# Patient Record
Sex: Female | Born: 1935 | Race: White | Hispanic: No | Smoking: Never smoker
Health system: Southern US, Community
[De-identification: ages and names within clinical notes are randomized; demographics above are authoritative.]

## PROBLEM LIST (undated history)

## (undated) DIAGNOSIS — F329 Major depressive disorder, single episode, unspecified: Secondary | ICD-10-CM

## (undated) DIAGNOSIS — C50919 Malignant neoplasm of unspecified site of unspecified female breast: Secondary | ICD-10-CM

## (undated) DIAGNOSIS — R42 Dizziness and giddiness: Secondary | ICD-10-CM

## (undated) DIAGNOSIS — I10 Essential (primary) hypertension: Secondary | ICD-10-CM

## (undated) DIAGNOSIS — M1990 Unspecified osteoarthritis, unspecified site: Secondary | ICD-10-CM

## (undated) DIAGNOSIS — M810 Age-related osteoporosis without current pathological fracture: Secondary | ICD-10-CM

## (undated) DIAGNOSIS — C4491 Basal cell carcinoma of skin, unspecified: Secondary | ICD-10-CM

## (undated) DIAGNOSIS — F32A Depression, unspecified: Secondary | ICD-10-CM

## (undated) HISTORY — PX: DILATION AND CURETTAGE OF UTERUS: SHX78

## (undated) HISTORY — PX: OTHER SURGICAL HISTORY: SHX169

## (undated) HISTORY — PX: COLONOSCOPY: SHX174

## (undated) HISTORY — PX: BREAST RECONSTRUCTION: SHX9

## (undated) HISTORY — PX: CHOLECYSTECTOMY: SHX55

## (undated) HISTORY — PX: MASTECTOMY: SHX3

## (undated) HISTORY — PX: ABDOMINAL HYSTERECTOMY: SHX81

## (undated) HISTORY — PX: TONSILLECTOMY: SUR1361

---

## 1997-06-30 ENCOUNTER — Emergency Department (HOSPITAL_COMMUNITY): Admission: EM | Admit: 1997-06-30 | Discharge: 1997-06-30 | Payer: Self-pay | Admitting: Emergency Medicine

## 1997-07-25 ENCOUNTER — Encounter: Admission: RE | Admit: 1997-07-25 | Discharge: 1997-10-23 | Payer: Self-pay | Admitting: *Deleted

## 1997-12-19 ENCOUNTER — Encounter: Admission: RE | Admit: 1997-12-19 | Discharge: 1998-03-19 | Payer: Self-pay | Admitting: Orthopaedic Surgery

## 1998-04-10 ENCOUNTER — Ambulatory Visit (HOSPITAL_COMMUNITY): Admission: RE | Admit: 1998-04-10 | Discharge: 1998-04-10 | Payer: Self-pay | Admitting: Specialist

## 1999-01-10 ENCOUNTER — Encounter (INDEPENDENT_AMBULATORY_CARE_PROVIDER_SITE_OTHER): Payer: Self-pay | Admitting: *Deleted

## 1999-01-10 ENCOUNTER — Ambulatory Visit (HOSPITAL_COMMUNITY): Admission: RE | Admit: 1999-01-10 | Discharge: 1999-01-10 | Payer: Self-pay | Admitting: Gastroenterology

## 2002-07-07 ENCOUNTER — Ambulatory Visit (HOSPITAL_COMMUNITY): Admission: RE | Admit: 2002-07-07 | Discharge: 2002-07-07 | Payer: Self-pay | Admitting: Gastroenterology

## 2002-07-07 ENCOUNTER — Encounter (INDEPENDENT_AMBULATORY_CARE_PROVIDER_SITE_OTHER): Payer: Self-pay | Admitting: Specialist

## 2007-01-10 ENCOUNTER — Emergency Department (HOSPITAL_COMMUNITY): Admission: EM | Admit: 2007-01-10 | Discharge: 2007-01-10 | Payer: Self-pay | Admitting: Emergency Medicine

## 2007-01-20 ENCOUNTER — Encounter: Admission: RE | Admit: 2007-01-20 | Discharge: 2007-01-20 | Payer: Self-pay | Admitting: Internal Medicine

## 2007-08-11 ENCOUNTER — Emergency Department (HOSPITAL_COMMUNITY): Admission: EM | Admit: 2007-08-11 | Discharge: 2007-08-12 | Payer: Self-pay | Admitting: Emergency Medicine

## 2014-09-18 ENCOUNTER — Encounter (HOSPITAL_COMMUNITY): Payer: Self-pay | Admitting: Emergency Medicine

## 2014-09-18 ENCOUNTER — Emergency Department (HOSPITAL_COMMUNITY)
Admission: EM | Admit: 2014-09-18 | Discharge: 2014-09-18 | Disposition: A | Discharge status: Home / Self Care | Payer: Medicare Other | Attending: Emergency Medicine | Admitting: Emergency Medicine

## 2014-09-18 ENCOUNTER — Emergency Department (HOSPITAL_COMMUNITY): Payer: Medicare Other

## 2014-09-18 DIAGNOSIS — Y999 Unspecified external cause status: Secondary | ICD-10-CM | POA: Diagnosis not present

## 2014-09-18 DIAGNOSIS — W01198A Fall on same level from slipping, tripping and stumbling with subsequent striking against other object, initial encounter: Secondary | ICD-10-CM | POA: Insufficient documentation

## 2014-09-18 DIAGNOSIS — Z880 Allergy status to penicillin: Secondary | ICD-10-CM | POA: Insufficient documentation

## 2014-09-18 DIAGNOSIS — Z85828 Personal history of other malignant neoplasm of skin: Secondary | ICD-10-CM | POA: Diagnosis not present

## 2014-09-18 DIAGNOSIS — S0990XA Unspecified injury of head, initial encounter: Secondary | ICD-10-CM

## 2014-09-18 DIAGNOSIS — I10 Essential (primary) hypertension: Secondary | ICD-10-CM | POA: Diagnosis not present

## 2014-09-18 DIAGNOSIS — M810 Age-related osteoporosis without current pathological fracture: Secondary | ICD-10-CM | POA: Diagnosis not present

## 2014-09-18 DIAGNOSIS — Z79899 Other long term (current) drug therapy: Secondary | ICD-10-CM | POA: Diagnosis not present

## 2014-09-18 DIAGNOSIS — Z853 Personal history of malignant neoplasm of breast: Secondary | ICD-10-CM | POA: Insufficient documentation

## 2014-09-18 DIAGNOSIS — Y939 Activity, unspecified: Secondary | ICD-10-CM | POA: Diagnosis not present

## 2014-09-18 DIAGNOSIS — F329 Major depressive disorder, single episode, unspecified: Secondary | ICD-10-CM | POA: Diagnosis not present

## 2014-09-18 DIAGNOSIS — Y92003 Bedroom of unspecified non-institutional (private) residence as the place of occurrence of the external cause: Secondary | ICD-10-CM | POA: Diagnosis not present

## 2014-09-18 DIAGNOSIS — S0101XA Laceration without foreign body of scalp, initial encounter: Secondary | ICD-10-CM | POA: Diagnosis not present

## 2014-09-18 DIAGNOSIS — W19XXXA Unspecified fall, initial encounter: Secondary | ICD-10-CM

## 2014-09-18 DIAGNOSIS — Y92009 Unspecified place in unspecified non-institutional (private) residence as the place of occurrence of the external cause: Secondary | ICD-10-CM

## 2014-09-18 HISTORY — DX: Basal cell carcinoma of skin, unspecified: C44.91

## 2014-09-18 HISTORY — DX: Major depressive disorder, single episode, unspecified: F32.9

## 2014-09-18 HISTORY — DX: Dizziness and giddiness: R42

## 2014-09-18 HISTORY — DX: Age-related osteoporosis without current pathological fracture: M81.0

## 2014-09-18 HISTORY — DX: Unspecified osteoarthritis, unspecified site: M19.90

## 2014-09-18 HISTORY — DX: Depression, unspecified: F32.A

## 2014-09-18 HISTORY — DX: Essential (primary) hypertension: I10

## 2014-09-18 HISTORY — DX: Malignant neoplasm of unspecified site of unspecified female breast: C50.919

## 2014-09-18 NOTE — ED Provider Notes (Signed)
CSN: [id number]     Arrival date & time 09/18/14  0341 History   First MD Initiated Contact with Patient 09/18/14 0518     Chief Complaint  Patient presents with  . Head Injury     (Consider location/radiation/quality/duration/timing/severity/associated sxs/prior Treatment) HPI  This is an 81 year old female who had a mechanical fall at home this morning about 2 AM. There was no loss of consciousness but she did strike her head against a chest of drawers. She struck both the back of her head and also the right side of her head. She has a small laceration to her right temple. There was significant bleeding but this has been controlled with pressure. There is minimal associated pain. She denies neck pain or other injury. She has not been vomiting. She is not on anticoagulation.  Past Medical History  Diagnosis Date  . Hypertension   . Depression   . Breast cancer   . Basal cell carcinoma   . Osteoporosis   . Vertigo   . Arthritis    Past Surgical History  Procedure Laterality Date  . Cholecystectomy    . Tonsillectomy    . Mastectomy    . Dilation and curettage of uterus    . Abdominal hysterectomy    . Cataract surgery    . Capsulotomy with vaginal laser    . Colonoscopy    . Breast reconstruction     No family history on file. History  Substance Use Topics  . Smoking status: Never Smoker   . Smokeless tobacco: Not on file  . Alcohol Use: No   OB History    No data available     Review of Systems  All other systems reviewed and are negative.   Allergies  Azithromycin; Dalmane; Demerol; Felodipine; Fosamax; Hctz; Ivp dye; Keflin; Macrodantin; Morphine and related; Phenergan; Valium; Doxycycline; Keflex; Neosporin; and Penicillins  Home Medications   Prior to Admission medications   Medication Sig Start Date End Date Taking? Authorizing Provider  Calcium Carbonate-Vitamin D (CALCIUM 600+D HIGH POTENCY) 600-400 MG-UNIT per tablet Take 1 tablet by mouth 2 (two)  times daily.   Yes Historical Provider, MD  carvedilol (COREG) 25 MG tablet Take 25 mg by mouth 2 (two) times daily with a meal.   Yes Historical Provider, MD  cholecalciferol (VITAMIN D) 1000 UNITS tablet Take 1,000 Units by mouth daily.   Yes Historical Provider, MD  citalopram (CELEXA) 20 MG tablet Take 20 mg by mouth at bedtime.   Yes Historical Provider, MD  furosemide (LASIX) 20 MG tablet Take 20 mg by mouth daily as needed (for stasis detrmatitis).   Yes Historical Provider, MD  Lactobacillus Rhamnosus, GG, (CULTURELLE PO) Take 1 capsule by mouth daily.   Yes Historical Provider, MD  lisinopril (PRINIVIL,ZESTRIL) 40 MG tablet Take 40 mg by mouth daily.   Yes Historical Provider, MD  loratadine (CLARITIN) 10 MG tablet Take 10 mg by mouth daily as needed for allergies.   Yes Historical Provider, MD   BP 155/61 mmHg  Pulse 80  Temp(Src) 98 F (36.7 C) (Oral)  Resp 18  SpO2 97%   Physical Exam  General: Well-developed, well-nourished female in no acute distress; appearance consistent with age of record HENT: normocephalic; atraumatic Eyes: pupils equal, round and reactive to light; extraocular muscles intact; lens implants Neck: supple Heart: regular rate and rhythm Lungs: clear to auscultation bilaterally Abdomen: soft; nondistended; nontender; bowel sounds present Extremities: Arthritic changes; nontender; trace edema of lower legs Neurologic: Awake, alert  and oriented; motor function intact in all extremities and symmetric; no facial droop Skin: Warm and dry Psychiatric: Normal mood and affect    ED Course  Procedures (including critical care time)   MDM  Nursing notes and vitals signs, including pulse oximetry, reviewed.  Summary of this visit's results, reviewed by myself:  Imaging Studies: Ct Head Wo Contrast  09/18/2014   CLINICAL DATA:  Patient fell backwards in the bathroom and struck back of head on a chest of doors. Struck left-sided head on unknown object.  Discomfort to the back of the head. Puncture wound to the left side of the head.  EXAM: CT HEAD WITHOUT CONTRAST  TECHNIQUE: Contiguous axial images were obtained from the base of the skull through the vertex without intravenous contrast.  COMPARISON:  MRI brain 01/20/2007.  CT head 01/10/2007.  FINDINGS: Ventricles and sulci appear symmetrical with mild atrophy. Extra-axial calcified lesion in the left posterior frontal region measuring 15 mm diameter, likely representing meningioma. Additional 5 mm rounded calcification in the right frontal convexity also likely represents a small meningioma. Dense calcification along the falx. No mass effect or midline shift. No abnormal extra-axial fluid collections. Gray-white matter junctions are distinct. Basal cisterns are not effaced. No evidence of acute intracranial hemorrhage. No depressed skull fractures. Visualized paranasal sinuses and mastoid air cells are not opacified.  IMPRESSION: No acute intracranial abnormalities. Extra-axial calcified lesions likely representing meningiomas, unchanged since previous study.   Electronically Signed   By: Lucienne Capers M.D.   On: 09/18/2014 06:03      Shanon Rosser, MD 09/18/14 [phone]

## 2014-09-18 NOTE — ED Notes (Signed)
Pt states she fell backward in her bedroom and hit her the back of her head on the chest of drawers and hit the left side of her head on unknown object  Pt is c/o discomfort to the back of her head  Pt has a puncture wound noted to the left side of her head  Bleeding controlled  Pt denies LOC  Pt denies taking any blood thinners

## 2015-12-04 ENCOUNTER — Emergency Department (HOSPITAL_COMMUNITY)
Admission: EM | Admit: 2015-12-04 | Discharge: 2015-12-04 | Disposition: A | Discharge status: Home / Self Care | Payer: Medicare Other | Attending: Emergency Medicine | Admitting: Emergency Medicine

## 2015-12-04 ENCOUNTER — Emergency Department (HOSPITAL_COMMUNITY): Payer: Medicare Other

## 2015-12-04 ENCOUNTER — Encounter (HOSPITAL_COMMUNITY): Payer: Self-pay | Admitting: *Deleted

## 2015-12-04 DIAGNOSIS — W109XXA Fall (on) (from) unspecified stairs and steps, initial encounter: Secondary | ICD-10-CM | POA: Insufficient documentation

## 2015-12-04 DIAGNOSIS — Z79899 Other long term (current) drug therapy: Secondary | ICD-10-CM | POA: Diagnosis not present

## 2015-12-04 DIAGNOSIS — Z853 Personal history of malignant neoplasm of breast: Secondary | ICD-10-CM | POA: Insufficient documentation

## 2015-12-04 DIAGNOSIS — Y999 Unspecified external cause status: Secondary | ICD-10-CM | POA: Diagnosis not present

## 2015-12-04 DIAGNOSIS — M79604 Pain in right leg: Secondary | ICD-10-CM

## 2015-12-04 DIAGNOSIS — M25561 Pain in right knee: Secondary | ICD-10-CM

## 2015-12-04 DIAGNOSIS — Y92007 Garden or yard of unspecified non-institutional (private) residence as the place of occurrence of the external cause: Secondary | ICD-10-CM | POA: Diagnosis not present

## 2015-12-04 DIAGNOSIS — Z85828 Personal history of other malignant neoplasm of skin: Secondary | ICD-10-CM | POA: Insufficient documentation

## 2015-12-04 DIAGNOSIS — M170 Bilateral primary osteoarthritis of knee: Secondary | ICD-10-CM | POA: Insufficient documentation

## 2015-12-04 DIAGNOSIS — W19XXXA Unspecified fall, initial encounter: Secondary | ICD-10-CM

## 2015-12-04 DIAGNOSIS — Y939 Activity, unspecified: Secondary | ICD-10-CM | POA: Diagnosis not present

## 2015-12-04 DIAGNOSIS — I10 Essential (primary) hypertension: Secondary | ICD-10-CM | POA: Insufficient documentation

## 2015-12-04 NOTE — ED Provider Notes (Signed)
[facility] DEPT Provider Note   CSN: [id number] Arrival date & time: 12/04/15  1811     History   Chief Complaint No chief complaint on file.   HPI Dawn Roy is a 82 y.o. female with a PMHx of knee arthritis, chronic stasis dermatitis/venous stasis, remote breast cancer s/p mastectomy, HTN, depression, osteoporosis, and vertigo, who presents to the ED with complaints of mechanical fall resulting in right leg pain. Patient states that around 4 PM she was leaving [place], ambulating with her cane and was looking back at her husband and then looked forward and accidentally missed a step at the [employer] exit, causing her to fall to the left side and then twist her R leg and potentially strike it against the door frame/wall. She denies any prodromal presyncopal sensation, lightheadedness, head injury, or loss of consciousness. She states that she was able to get up and ambulate after the fall, but she has had some right lateral lower leg pain since then, describing it as 5/10 constant throbbing nonradiating pain worse with weightbearing and movement of her leg, and slightly improved with Tylenol. She reports mild swelling to the area on the lateral right leg. She notes that she has chronic stasis dermatitis that causes some mild redness to both lower legs, this is unchanged and at baseline, and states this is unrelated to today's injury.  She denies any bruising, abrasions, warmth to the area, neck or back pain, recent fevers or chills, chest pain, shortness breath, abdominal pain, N/V/D/C, dysuria, hematuria, numbness, tingling, or focal weakness. Denies ankle pain or foot pain. Denies any other injuries or complaints at this time.   The history is provided by the patient and medical records. No language interpreter was used.  Leg Pain   This is a new problem. The current episode started 1 to 2 hours ago. The problem occurs constantly. The problem has not changed since  onset.The pain is present in the right lower leg and right knee. Quality: throbbing. The pain is at a severity of 5/10. The pain is mild. Pertinent negatives include no numbness and no tingling. The symptoms are aggravated by standing. She has tried OTC pain medications for the symptoms. The treatment provided moderate relief.    Past Medical History:  Diagnosis Date  . Arthritis   . Basal cell carcinoma   . Breast cancer ([place])   . Depression   . Hypertension   . Osteoporosis   . Vertigo     There are no active problems to display for this patient.   Past Surgical History:  Procedure Laterality Date  . ABDOMINAL HYSTERECTOMY    . BREAST RECONSTRUCTION    . capsulotomy with vaginal laser    . cataract surgery    . CHOLECYSTECTOMY    . COLONOSCOPY    . DILATION AND CURETTAGE OF UTERUS    . MASTECTOMY    . TONSILLECTOMY      OB History    No data available       Home Medications    Prior to Admission medications   Medication Sig Start Date End Date Taking? Authorizing Provider  Calcium Carbonate-Vitamin D (CALCIUM 600+D HIGH POTENCY) 600-400 MG-UNIT per tablet Take 1 tablet by mouth 2 (two) times daily.    Historical Provider, MD  carvedilol (COREG) 25 MG tablet Take 25 mg by mouth 2 (two) times daily with a meal.    Historical Provider, MD  cholecalciferol (VITAMIN D) 1000 UNITS tablet Take 1,000 Units  by mouth daily.    Historical Provider, MD  citalopram (CELEXA) 20 MG tablet Take 20 mg by mouth at bedtime.    Historical Provider, MD  furosemide (LASIX) 20 MG tablet Take 20 mg by mouth daily as needed (for stasis detrmatitis).    Historical Provider, MD  Lactobacillus Rhamnosus, GG, (CULTURELLE PO) Take 1 capsule by mouth daily.    Historical Provider, MD  lisinopril (PRINIVIL,ZESTRIL) 40 MG tablet Take 40 mg by mouth daily.    Historical Provider, MD  loratadine (CLARITIN) 10 MG tablet Take 10 mg by mouth daily as needed for allergies.    Historical Provider, MD     Family History No family history on file.  Social History Social History  Substance Use Topics  . Smoking status: Never Smoker  . Smokeless tobacco: Never Used  . Alcohol use No     Allergies   Azithromycin; Dalmane [flurazepam]; Demerol [meperidine]; Felodipine; Fosamax [alendronate sodium]; Hctz [hydrochlorothiazide]; Ivp dye [iodinated diagnostic agents]; Keflin [cephalothin]; Macrodantin [nitrofurantoin macrocrystal]; Morphine and related; Phenergan [promethazine hcl]; Valium [diazepam]; Doxycycline; Keflex [cephalexin]; Neosporin [neomycin-bacitracin zn-polymyx]; and Penicillins   Review of Systems Review of Systems  Constitutional: Negative for chills and fever.  HENT: Negative for facial swelling (no head inj).   Respiratory: Negative for shortness of breath.   Cardiovascular: Negative for chest pain.  Gastrointestinal: Negative for abdominal pain, constipation, diarrhea, nausea and vomiting.  Genitourinary: Negative for dysuria and hematuria.  Musculoskeletal: Positive for arthralgias and joint swelling. Negative for back pain and neck pain.  Skin: Negative for color change and wound.  Allergic/Immunologic: Negative for immunocompromised state.  Neurological: Negative for tingling, syncope, weakness, light-headedness and numbness.  Psychiatric/Behavioral: Negative for confusion.   10 Systems reviewed and are negative for acute change except as noted in the HPI.   Physical Exam Updated Vital Signs BP 120/60 (BP Location: Right Arm)   Pulse 67   Temp 98.1 F (36.7 C) (Oral)   Resp 18   Ht 5\' 2"  (1.575 m)   Wt 98.9 kg   SpO2 98%   BMI 39.87 kg/m   Physical Exam  Constitutional: She is oriented to person, place, and time. Vital signs are normal. She appears well-developed and well-nourished.  Non-toxic appearance. No distress.  Afebrile, nontoxic, NAD  HENT:  Head: Normocephalic and atraumatic.  Mouth/Throat: Mucous membranes are normal.  Eyes:  Conjunctivae and EOM are normal. Right eye exhibits no discharge. Left eye exhibits no discharge.  Neck: Normal range of motion. Neck supple. No spinous process tenderness and no muscular tenderness present. No neck rigidity. Normal range of motion present.  FROM intact without spinous process TTP, no bony stepoffs or deformities, no paraspinous muscle TTP or muscle spasms. No rigidity or meningeal signs. No bruising or swelling.   Cardiovascular: Normal rate and intact distal pulses.   Pulmonary/Chest: Effort normal. No respiratory distress.  Abdominal: Normal appearance. She exhibits no distension.  Musculoskeletal: Normal range of motion.       Right knee: She exhibits swelling. She exhibits normal range of motion, no ecchymosis, no deformity, no laceration, no erythema, normal alignment, no LCL laxity, normal patellar mobility and no MCL laxity. Tenderness found. Lateral joint line tenderness noted.       Right lower leg: She exhibits tenderness. She exhibits no deformity and no laceration.       Legs: R lower leg and knee with FROM intact, with mild lateral joint line TTP extending into lateral lower leg to mid-calf region, with slight swelling  to lateral knee, no deformity, no bruising, no abnormal alignment or patellar mobility, no varus/valgus laxity, neg anterior drawer test, no crepitus, with chronic mild erythema to b/l ankle/lower legs but no warmth and states this erythema is chronic and unchanged. No focal tenderness to foot or ankle.  Strength and sensation grossly intact, distal pulses intact, compartments soft  All spinal levels nonTTP without bony stepoffs or deformities  Neurological: She is alert and oriented to person, place, and time. She has normal strength. No sensory deficit.  Skin: Skin is warm, dry and intact. No rash noted.  Psychiatric: She has a normal mood and affect. Her behavior is normal.  Nursing note and vitals reviewed.    ED Treatments / Results  Labs (all  labs ordered are listed, but only abnormal results are displayed) Labs Reviewed - No data to display  EKG  EKG Interpretation None       Radiology Dg Tibia/fibula Right  Result Date: 12/04/2015 CLINICAL DATA:  82 year old female status post fall. EXAM: RIGHT TIBIA AND FIBULA - 2 VIEW COMPARISON:  None. FINDINGS: The bones are osteopenic. Moderate changes of osteoarthritis noted within the right knee. There is no evidence of fracture or other focal bone lesions. Soft tissues are unremarkable. IMPRESSION: 1. No acute findings. 2. Right knee osteoarthritis. Electronically Signed   By: Kerby Moors M.D.   On: 12/04/2015 20:17   Dg Knee Complete 4 Views Right  Result Date: 12/04/2015 CLINICAL DATA:  Status post fall.  Knee pain. EXAM: RIGHT KNEE - COMPLETE 4+ VIEW COMPARISON:  None. FINDINGS: No evidence of fracture, dislocation, or joint effusion. There is marked medial compartment osteoarthritis. Mild to moderate patellofemoral osteoarthritis noted. Soft tissues are unremarkable. IMPRESSION: 1. Osteoarthritis. 2. No acute findings. Electronically Signed   By: Kerby Moors M.D.   On: 12/04/2015 20:18    Procedures Procedures (including critical care time)  Medications Ordered in ED Medications - No data to display   Initial Impression / Assessment and Plan / ED Course  I have reviewed the triage vital signs and the nursing notes.  Pertinent labs & imaging results that were available during my care of the patient were reviewed by me and considered in my medical decision making (see chart for details).  Clinical Course    82 y.o. female here with mechanical fall c/o R leg pain. Has chronic stasis dermatitis so has some redness to lower legs bilaterally but states this is chronic and unchanged, no warmth, no cellulitic findings. No bruising. Some mild swelling to lateral R knee/proximal lower leg, with focal tenderness to this area as well as to mid-lower leg laterally. NVI with soft  compartments. Will obtain xray of tib/fib and knee. Pt declined wanting pain meds. Will reassess after imaging, if inconclusive for tibial plateau fx and pt unable to ambulate then may need to consider CT knee but will await xray imaging first then attempt to ambulate. Doubt need for labs, as this was a mechanical fall. Discussed case with my attending Dr. Alvino Chapel who agrees with plan.   8:38 PM Xray shows osteoarthritis without acute findings. Attempted to ambulate and pt able to ambulate with minimal assistance, went to the restroom without difficulty. Has a walker at home, discussed that she can use this for a few days to help take the burden off the R leg. Doubt need for CT imaging since she is ambulatory and xray of knee neg for fx, doubt occult tibial plateau fx. Discussed tylenol/motrin and RICE. F/up with  her orthopedist and/or her PCP in 1wk for recheck of symptoms. I explained the diagnosis and have given explicit precautions to return to the ER including for any other new or worsening symptoms. The patient understands and accepts the medical plan as it's been dictated and I have answered their questions. Discharge instructions concerning home care and prescriptions have been given. The patient is STABLE and is discharged to home in good condition.    Final Clinical Impressions(s) / ED Diagnoses   Final diagnoses:  Right leg pain  Right knee pain  Osteoarthritis of both knees, unspecified osteoarthritis type  Fall, initial encounter    New Prescriptions New Prescriptions   No medications on file     [employer], PA-C 12/04/15 2040    Davonna Belling, MD 12/05/15 [phone]

## 2015-12-04 NOTE — ED Triage Notes (Addendum)
Patient states she took her husband to [employer] for a late lunch around 4 pm today and as she was exiting [employer] she stumbled and missed a step, landing on her right leg. Patient ambulates with a cane at baseline. Patient denies LOC, hitting head, dizziness, shortness of breath and chest pain.  Patient remember the events just prior the fall, she remembers the fall and the events just after the fall.  Patient c/o right lower leg pain, worse in the mid-lateral calf.  Pain is worse when she moves her foot or tries to stand on RLE.  Patient has mild swelling to lateral right calf.  Skin is not exceptionally warm to touch and no redness in calf noted.  Patient has some erythema in RLE, but states this is not new and she is "working on that" with her PCP.  Patient states her right knee is often weak and she needs to have knee surgery, but has been unable to do so.

## 2015-12-04 NOTE — Discharge Instructions (Signed)
Your xray show arthritis in your knee but otherwise there are no fractures. You likely just strained the knee and leg when you fell. Use your home walker and/or cane as needed for comfort. Ice and elevate knee throughout the day, using ice pack for no more than 20 minutes every hour. Alternate between tylenol and motrin for pain relief. Follow up with your primary care doctor and/or your orthopedist in the next 1 week for recheck of symptoms and ongoing management of your injury. Return to the ER for changes or worsening symptoms.

## 2015-12-04 NOTE — ED Notes (Addendum)
Pt assisted to restroom.  

## 2015-12-04 NOTE — ED Notes (Signed)
Pt in X-Ray ?

## 2015-12-18 ENCOUNTER — Other Ambulatory Visit: Payer: Self-pay | Admitting: Internal Medicine

## 2015-12-18 DIAGNOSIS — R52 Pain, unspecified: Secondary | ICD-10-CM

## 2015-12-18 DIAGNOSIS — R609 Edema, unspecified: Secondary | ICD-10-CM

## 2015-12-18 DIAGNOSIS — R600 Localized edema: Secondary | ICD-10-CM

## 2015-12-19 ENCOUNTER — Ambulatory Visit
Admission: RE | Admit: 2015-12-19 | Discharge: 2015-12-19 | Disposition: A | Discharge status: Home / Self Care | Payer: Medicare Other | Source: Ambulatory Visit | Attending: Internal Medicine | Admitting: Internal Medicine

## 2015-12-19 DIAGNOSIS — R52 Pain, unspecified: Secondary | ICD-10-CM

## 2015-12-19 DIAGNOSIS — R609 Edema, unspecified: Secondary | ICD-10-CM

## 2015-12-19 DIAGNOSIS — R600 Localized edema: Secondary | ICD-10-CM

## 2018-01-24 ENCOUNTER — Other Ambulatory Visit: Payer: Self-pay | Admitting: Internal Medicine

## 2018-01-24 DIAGNOSIS — M85859 Other specified disorders of bone density and structure, unspecified thigh: Secondary | ICD-10-CM

## 2018-05-09 IMAGING — CR DG KNEE COMPLETE 4+V*R*
3 series · 3 of 3 positions shown · non-contrast
Comparison: None.

CLINICAL DATA: Status post fall.  Knee pain.

EXAM:
RIGHT KNEE - COMPLETE 4+ VIEW

[x knee ap right (1 of 3)]
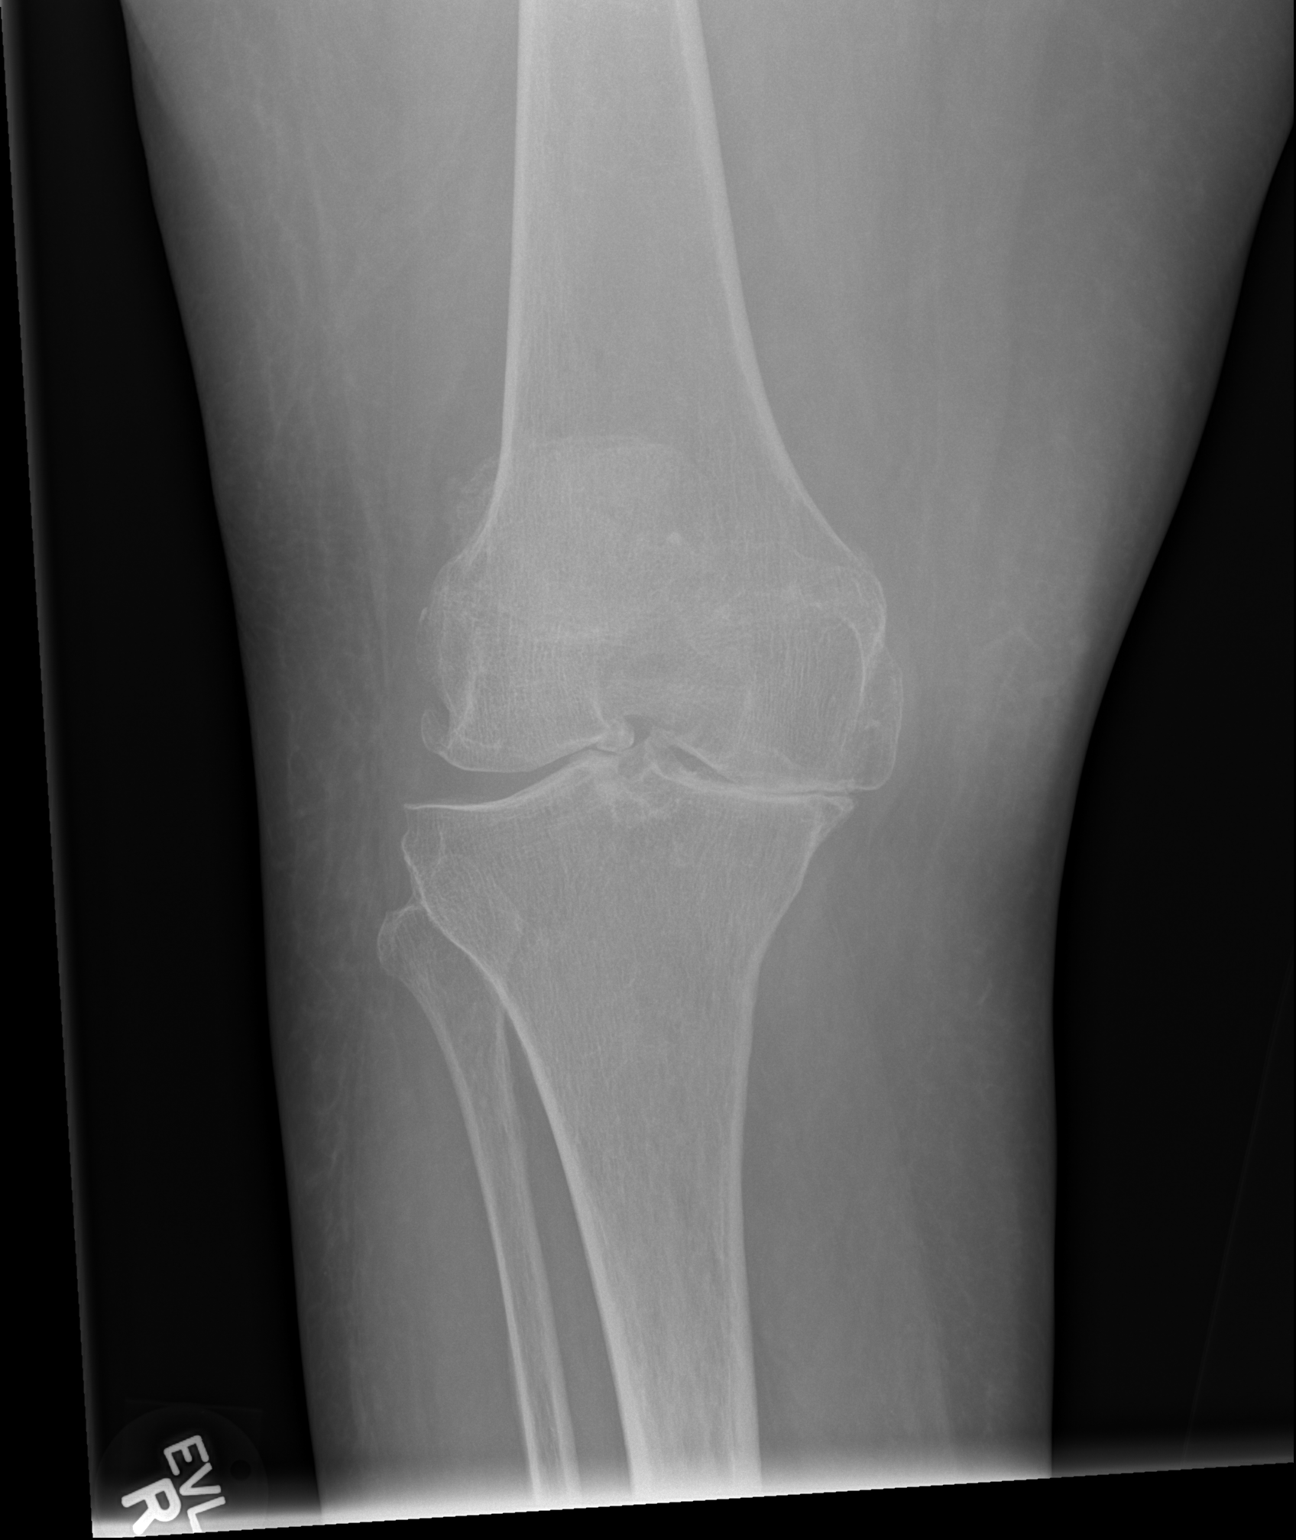

[x knee ap right (2 of 3)]
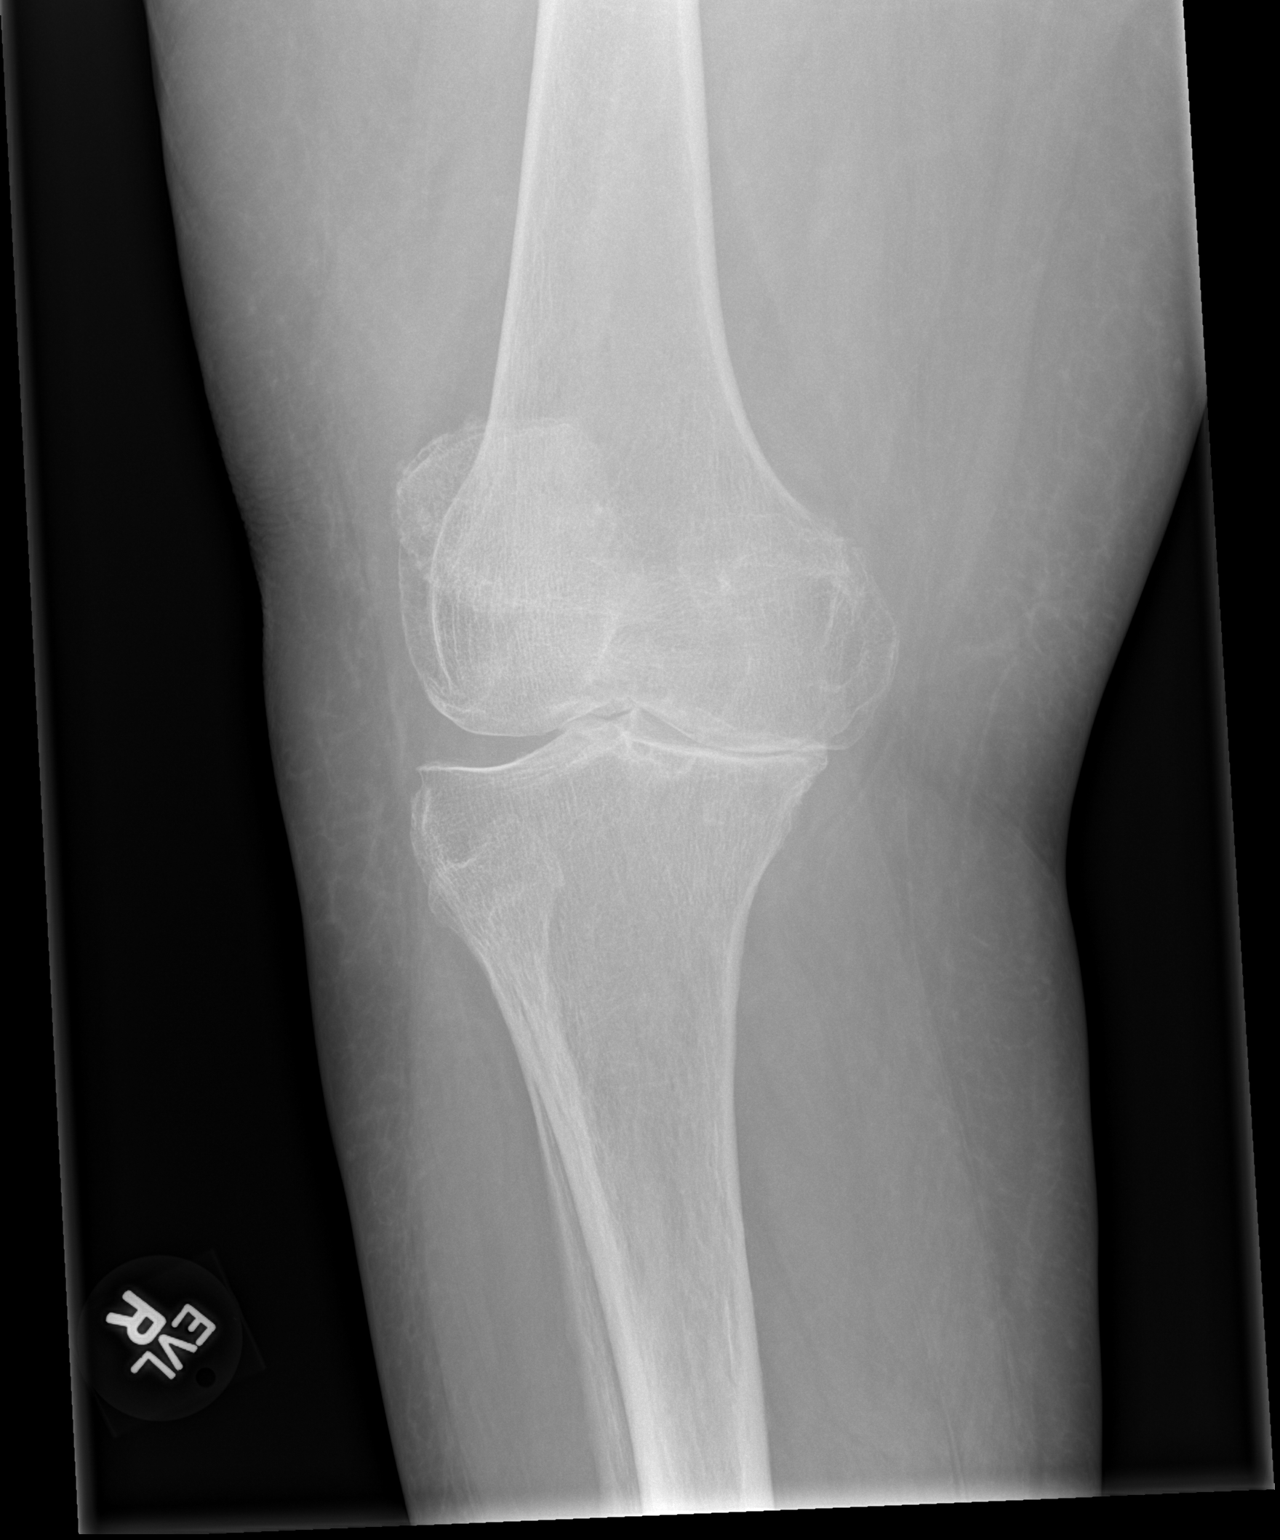

[x knee ap right (3 of 3)]
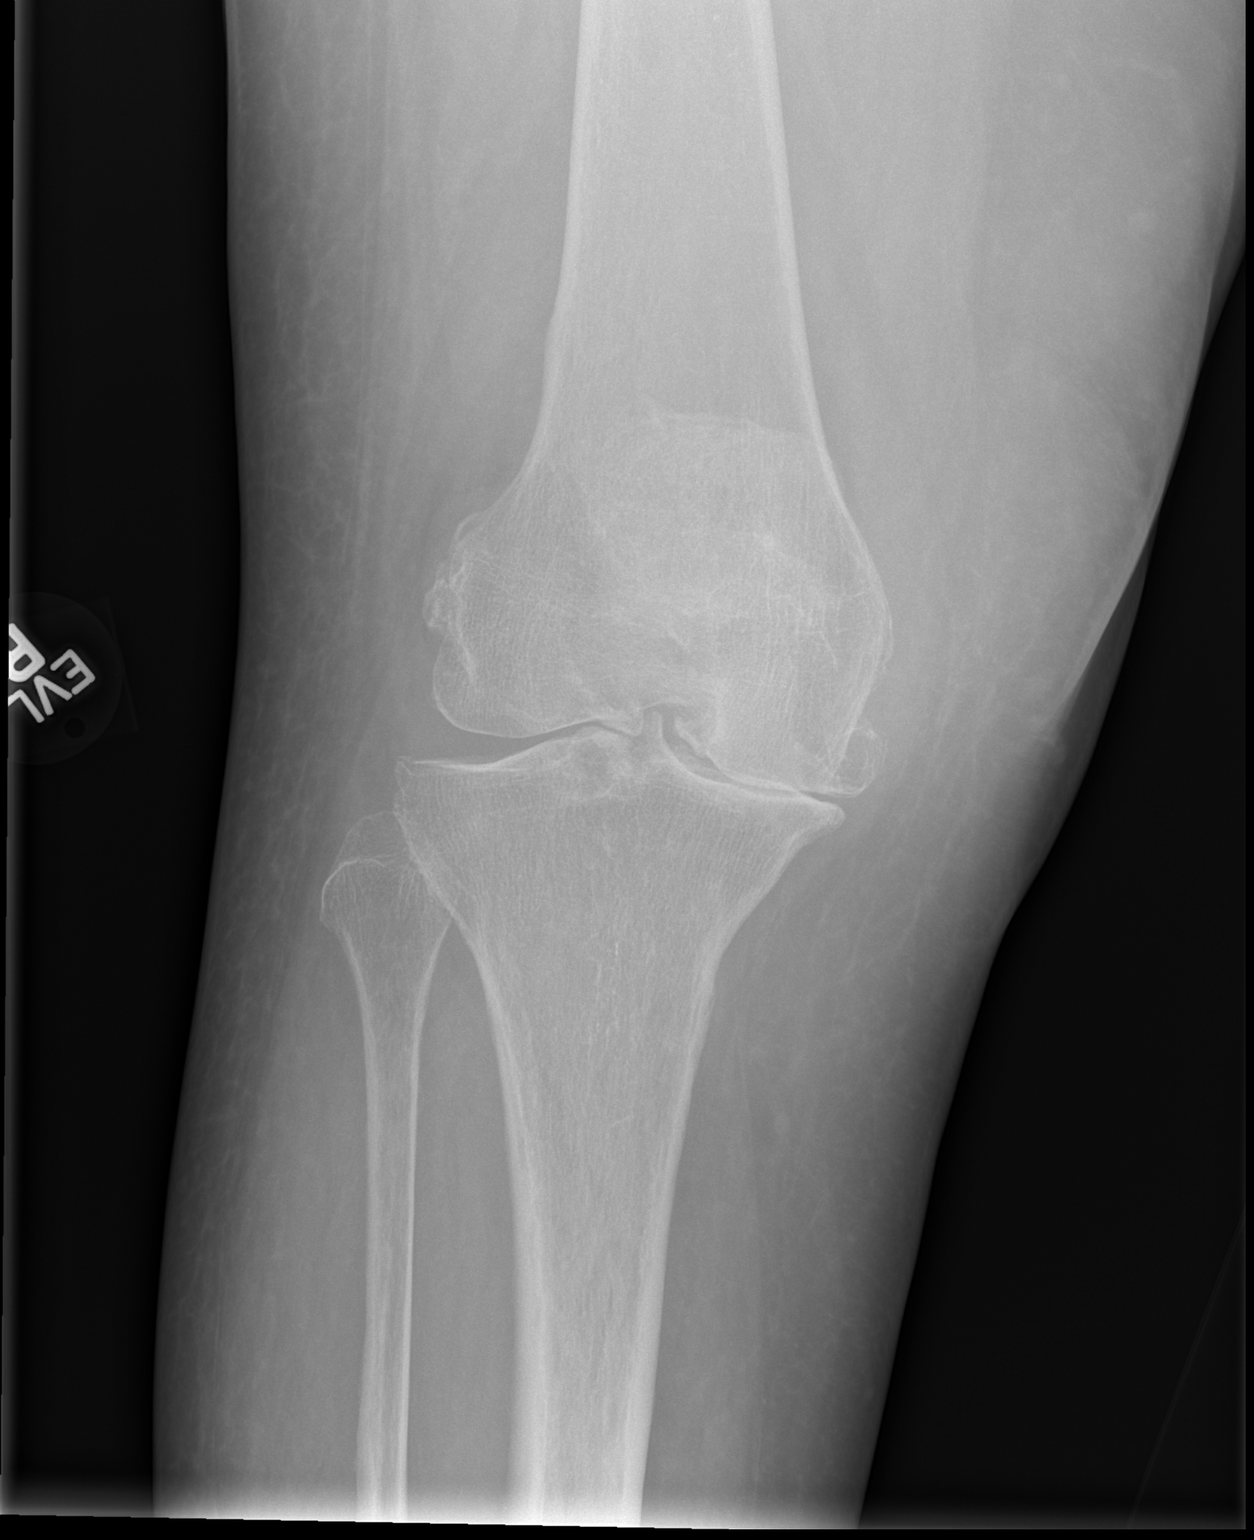

[3 of 3 positions shown; findings below may reference images not displayed]

FINDINGS: No evidence of fracture, dislocation, or joint effusion. There is
marked medial compartment osteoarthritis. Mild to moderate
patellofemoral osteoarthritis noted. Soft tissues are unremarkable.
IMPRESSION: 1. Osteoarthritis.
2. No acute findings.

## 2018-05-24 ENCOUNTER — Ambulatory Visit: Payer: 59 | Admitting: Sports Medicine

## 2018-06-06 ENCOUNTER — Inpatient Hospital Stay: Admission: RE | Admit: 2018-06-06 | Payer: Medicare Other | Source: Ambulatory Visit

## 2018-08-10 ENCOUNTER — Other Ambulatory Visit: Payer: Medicare Other

## 2018-12-26 ENCOUNTER — Ambulatory Visit: Payer: 59 | Admitting: Podiatry

## 2019-01-02 ENCOUNTER — Other Ambulatory Visit: Payer: Self-pay

## 2019-01-02 ENCOUNTER — Ambulatory Visit (INDEPENDENT_AMBULATORY_CARE_PROVIDER_SITE_OTHER): Payer: Medicare Other | Admitting: Podiatry

## 2019-01-02 DIAGNOSIS — L989 Disorder of the skin and subcutaneous tissue, unspecified: Secondary | ICD-10-CM

## 2019-01-04 NOTE — Progress Notes (Signed)
   Subjective: 85 y.o. female presenting to the office today as a new patient with a chief complaint of pain to the right fourth toe that began about two years ago. She believes she has a hammertoe causing the pain. Wearing shoes makes the pain worse. She has not had any treatment for the symptoms. Patient is here for further evaluation and treatment.   Past Medical History:  Diagnosis Date  . Arthritis   . Basal cell carcinoma   . Breast cancer ([place])   . Depression   . Hypertension   . Osteoporosis   . Vertigo      Objective:  Physical Exam General: Alert and oriented x3 in no acute distress  Dermatology: Hyperkeratotic lesion(s) present on the right fourth toe. Pain on palpation with a central nucleated core noted. Skin is warm, dry and supple bilateral lower extremities. Negative for open lesions or macerations.  Vascular: Palpable pedal pulses bilaterally. No edema or erythema noted. Capillary refill within normal limits.  Neurological: Epicritic and protective threshold grossly intact bilaterally.   Musculoskeletal Exam: Pain on palpation at the keratotic lesion(s) noted. Range of motion within normal limits bilateral. Muscle strength 5/5 in all groups bilateral.  Assessment: 1. Porokeratosis right fourth toe    Plan of Care:  1. Patient evaluated 2. Excisional debridement of keratoic lesion(s) using a chisel blade was performed without incident. Salinocaine applied.  3. Dressed area with light dressing. 4. Recommended OTC corn and callus remover.  5. Patient is to return to the clinic PRN.   Edrick Kins, DPM [facility]  Dr. Edrick Kins, [place]                                        [place], [place] [address]                Office [phone]  Fax [phone]

## 2019-04-30 ENCOUNTER — Ambulatory Visit: Payer: Medicare Other | Attending: Internal Medicine

## 2019-04-30 DIAGNOSIS — Z23 Encounter for immunization: Secondary | ICD-10-CM

## 2019-04-30 NOTE — Progress Notes (Signed)
Covid-19 Vaccination Clinic  Name:  Dawn Roy    MRN: [id number] DOB: November 07, 1929  04/30/2019  Dawn Roy was observed post Covid-19 immunization for 15 minutes without incidence. She was provided with Vaccine Information Sheet and instruction to access the V-Safe system.   Dawn Roy was instructed to call 911 with any severe reactions post vaccine: Marland Kitchen Difficulty breathing  . Swelling of your face and throat  . A fast heartbeat  . A bad rash all over your body  . Dizziness and weakness    Immunizations Administered    Name Date Dose VIS Date Route   Pfizer COVID-19 Vaccine 04/30/2019 10:10 AM 0.3 mL 02/17/2019 Intramuscular   Manufacturer: [employer], [employer]   Lot: [id number]   NDC: [address]-0

## 2019-05-24 ENCOUNTER — Ambulatory Visit: Payer: Medicare Other | Attending: Internal Medicine

## 2019-05-24 DIAGNOSIS — Z23 Encounter for immunization: Secondary | ICD-10-CM

## 2019-05-24 NOTE — Progress Notes (Signed)
Covid-19 Vaccination Clinic  Name:  Dawn Roy    MRN: [id number] DOB: 06/15/1929  05/24/2019  [name] was observed post Covid-19 immunization for 15 minutes without incident. She was provided with Vaccine Information Sheet and instruction to access the V-Safe system.   [name] was instructed to call 911 with any severe reactions post vaccine: Marland Kitchen Difficulty breathing  . Swelling of face and throat  . A fast heartbeat  . A bad rash all over body  . Dizziness and weakness   Immunizations Administered    Name Date Dose VIS Date Route   Pfizer COVID-19 Vaccine 05/24/2019  8:38 AM 0.3 mL 02/17/2019 Intramuscular   Manufacturer: [employer], [employer]   Lot: [id number]   NDC: [address]-2

## 2019-07-04 ENCOUNTER — Other Ambulatory Visit: Payer: Self-pay | Admitting: Internal Medicine

## 2019-07-04 DIAGNOSIS — M810 Age-related osteoporosis without current pathological fracture: Secondary | ICD-10-CM

## 2019-10-20 ENCOUNTER — Ambulatory Visit
Admission: RE | Admit: 2019-10-20 | Discharge: 2019-10-20 | Disposition: A | Discharge status: Home / Self Care | Payer: Medicare Other | Source: Ambulatory Visit | Attending: Internal Medicine | Admitting: Internal Medicine

## 2019-10-20 ENCOUNTER — Other Ambulatory Visit: Payer: Self-pay

## 2019-10-20 DIAGNOSIS — M810 Age-related osteoporosis without current pathological fracture: Secondary | ICD-10-CM

## 2020-07-01 DIAGNOSIS — I872 Venous insufficiency (chronic) (peripheral): Secondary | ICD-10-CM | POA: Diagnosis not present

## 2020-07-01 DIAGNOSIS — R262 Difficulty in walking, not elsewhere classified: Secondary | ICD-10-CM | POA: Diagnosis not present

## 2020-07-01 DIAGNOSIS — Z7189 Other specified counseling: Secondary | ICD-10-CM | POA: Diagnosis not present

## 2020-07-01 DIAGNOSIS — I10 Essential (primary) hypertension: Secondary | ICD-10-CM | POA: Diagnosis not present

## 2020-07-01 DIAGNOSIS — Z0000 Encounter for general adult medical examination without abnormal findings: Secondary | ICD-10-CM | POA: Diagnosis not present

## 2020-07-01 DIAGNOSIS — Z1389 Encounter for screening for other disorder: Secondary | ICD-10-CM | POA: Diagnosis not present

## 2020-07-01 DIAGNOSIS — Z23 Encounter for immunization: Secondary | ICD-10-CM | POA: Diagnosis not present

## 2020-07-01 DIAGNOSIS — R400 Somnolence: Secondary | ICD-10-CM | POA: Diagnosis not present

## 2020-07-01 DIAGNOSIS — H8113 Benign paroxysmal vertigo, bilateral: Secondary | ICD-10-CM | POA: Diagnosis not present

## 2020-07-01 DIAGNOSIS — E785 Hyperlipidemia, unspecified: Secondary | ICD-10-CM | POA: Diagnosis not present

## 2020-08-06 DIAGNOSIS — J410 Simple chronic bronchitis: Secondary | ICD-10-CM | POA: Diagnosis not present

## 2020-08-06 DIAGNOSIS — M8580 Other specified disorders of bone density and structure, unspecified site: Secondary | ICD-10-CM | POA: Diagnosis not present

## 2020-08-06 DIAGNOSIS — M810 Age-related osteoporosis without current pathological fracture: Secondary | ICD-10-CM | POA: Diagnosis not present

## 2020-08-06 DIAGNOSIS — N183 Chronic kidney disease, stage 3 unspecified: Secondary | ICD-10-CM | POA: Diagnosis not present

## 2020-08-06 DIAGNOSIS — E785 Hyperlipidemia, unspecified: Secondary | ICD-10-CM | POA: Diagnosis not present

## 2020-08-06 DIAGNOSIS — J45909 Unspecified asthma, uncomplicated: Secondary | ICD-10-CM | POA: Diagnosis not present

## 2020-08-06 DIAGNOSIS — I10 Essential (primary) hypertension: Secondary | ICD-10-CM | POA: Diagnosis not present

## 2020-09-19 DIAGNOSIS — E785 Hyperlipidemia, unspecified: Secondary | ICD-10-CM | POA: Diagnosis not present

## 2020-09-19 DIAGNOSIS — M810 Age-related osteoporosis without current pathological fracture: Secondary | ICD-10-CM | POA: Diagnosis not present

## 2020-09-19 DIAGNOSIS — J45909 Unspecified asthma, uncomplicated: Secondary | ICD-10-CM | POA: Diagnosis not present

## 2020-09-19 DIAGNOSIS — M1710 Unilateral primary osteoarthritis, unspecified knee: Secondary | ICD-10-CM | POA: Diagnosis not present

## 2020-09-19 DIAGNOSIS — J410 Simple chronic bronchitis: Secondary | ICD-10-CM | POA: Diagnosis not present

## 2020-09-19 DIAGNOSIS — M170 Bilateral primary osteoarthritis of knee: Secondary | ICD-10-CM | POA: Diagnosis not present

## 2020-09-19 DIAGNOSIS — Z853 Personal history of malignant neoplasm of breast: Secondary | ICD-10-CM | POA: Diagnosis not present

## 2020-09-19 DIAGNOSIS — N183 Chronic kidney disease, stage 3 unspecified: Secondary | ICD-10-CM | POA: Diagnosis not present

## 2020-09-19 DIAGNOSIS — M8580 Other specified disorders of bone density and structure, unspecified site: Secondary | ICD-10-CM | POA: Diagnosis not present

## 2020-09-19 DIAGNOSIS — I10 Essential (primary) hypertension: Secondary | ICD-10-CM | POA: Diagnosis not present

## 2020-12-31 DIAGNOSIS — M1810 Unilateral primary osteoarthritis of first carpometacarpal joint, unspecified hand: Secondary | ICD-10-CM | POA: Diagnosis not present

## 2020-12-31 DIAGNOSIS — Z853 Personal history of malignant neoplasm of breast: Secondary | ICD-10-CM | POA: Diagnosis not present

## 2020-12-31 DIAGNOSIS — R262 Difficulty in walking, not elsewhere classified: Secondary | ICD-10-CM | POA: Diagnosis not present

## 2020-12-31 DIAGNOSIS — Z0000 Encounter for general adult medical examination without abnormal findings: Secondary | ICD-10-CM | POA: Diagnosis not present

## 2020-12-31 DIAGNOSIS — Z23 Encounter for immunization: Secondary | ICD-10-CM | POA: Diagnosis not present

## 2020-12-31 DIAGNOSIS — M71349 Other bursal cyst, unspecified hand: Secondary | ICD-10-CM | POA: Diagnosis not present

## 2021-02-20 DIAGNOSIS — Z853 Personal history of malignant neoplasm of breast: Secondary | ICD-10-CM | POA: Diagnosis not present

## 2021-02-20 DIAGNOSIS — M1710 Unilateral primary osteoarthritis, unspecified knee: Secondary | ICD-10-CM | POA: Diagnosis not present

## 2021-02-20 DIAGNOSIS — N183 Chronic kidney disease, stage 3 unspecified: Secondary | ICD-10-CM | POA: Diagnosis not present

## 2021-02-20 DIAGNOSIS — J45909 Unspecified asthma, uncomplicated: Secondary | ICD-10-CM | POA: Diagnosis not present

## 2021-02-20 DIAGNOSIS — I10 Essential (primary) hypertension: Secondary | ICD-10-CM | POA: Diagnosis not present

## 2021-02-20 DIAGNOSIS — M8580 Other specified disorders of bone density and structure, unspecified site: Secondary | ICD-10-CM | POA: Diagnosis not present

## 2021-02-20 DIAGNOSIS — M810 Age-related osteoporosis without current pathological fracture: Secondary | ICD-10-CM | POA: Diagnosis not present

## 2021-02-20 DIAGNOSIS — M170 Bilateral primary osteoarthritis of knee: Secondary | ICD-10-CM | POA: Diagnosis not present

## 2021-02-20 DIAGNOSIS — J410 Simple chronic bronchitis: Secondary | ICD-10-CM | POA: Diagnosis not present

## 2021-02-20 DIAGNOSIS — E785 Hyperlipidemia, unspecified: Secondary | ICD-10-CM | POA: Diagnosis not present

## 2022-12-09 ENCOUNTER — Inpatient Hospital Stay (HOSPITAL_COMMUNITY)
Admission: EM | Admit: 2022-12-09 | Discharge: 2022-12-11 | DRG: 309 | Disposition: A | Discharge status: Home Health | Payer: Medicare Other | Attending: Internal Medicine | Admitting: Internal Medicine

## 2022-12-09 ENCOUNTER — Inpatient Hospital Stay (HOSPITAL_COMMUNITY): Payer: Medicare Other

## 2022-12-09 ENCOUNTER — Other Ambulatory Visit: Payer: Self-pay

## 2022-12-09 ENCOUNTER — Emergency Department (HOSPITAL_COMMUNITY): Payer: Medicare Other

## 2022-12-09 ENCOUNTER — Encounter (HOSPITAL_COMMUNITY): Payer: Self-pay | Admitting: Radiology

## 2022-12-09 DIAGNOSIS — I10 Essential (primary) hypertension: Secondary | ICD-10-CM | POA: Diagnosis not present

## 2022-12-09 DIAGNOSIS — R053 Chronic cough: Secondary | ICD-10-CM | POA: Diagnosis present

## 2022-12-09 DIAGNOSIS — Z881 Allergy status to other antibiotic agents status: Secondary | ICD-10-CM

## 2022-12-09 DIAGNOSIS — Z880 Allergy status to penicillin: Secondary | ICD-10-CM | POA: Diagnosis not present

## 2022-12-09 DIAGNOSIS — I129 Hypertensive chronic kidney disease with stage 1 through stage 4 chronic kidney disease, or unspecified chronic kidney disease: Secondary | ICD-10-CM | POA: Diagnosis present

## 2022-12-09 DIAGNOSIS — Z885 Allergy status to narcotic agent status: Secondary | ICD-10-CM | POA: Diagnosis not present

## 2022-12-09 DIAGNOSIS — I4891 Unspecified atrial fibrillation: Principal | ICD-10-CM | POA: Diagnosis present

## 2022-12-09 DIAGNOSIS — Z90710 Acquired absence of both cervix and uterus: Secondary | ICD-10-CM

## 2022-12-09 DIAGNOSIS — Z66 Do not resuscitate: Secondary | ICD-10-CM | POA: Diagnosis present

## 2022-12-09 DIAGNOSIS — F32A Depression, unspecified: Secondary | ICD-10-CM | POA: Diagnosis present

## 2022-12-09 DIAGNOSIS — Z6833 Body mass index (BMI) 33.0-33.9, adult: Secondary | ICD-10-CM

## 2022-12-09 DIAGNOSIS — N179 Acute kidney failure, unspecified: Secondary | ICD-10-CM | POA: Diagnosis present

## 2022-12-09 DIAGNOSIS — M1990 Unspecified osteoarthritis, unspecified site: Secondary | ICD-10-CM | POA: Diagnosis present

## 2022-12-09 DIAGNOSIS — Z853 Personal history of malignant neoplasm of breast: Secondary | ICD-10-CM

## 2022-12-09 DIAGNOSIS — Z888 Allergy status to other drugs, medicaments and biological substances status: Secondary | ICD-10-CM

## 2022-12-09 DIAGNOSIS — Z9013 Acquired absence of bilateral breasts and nipples: Secondary | ICD-10-CM

## 2022-12-09 DIAGNOSIS — N1832 Chronic kidney disease, stage 3b: Secondary | ICD-10-CM | POA: Diagnosis present

## 2022-12-09 DIAGNOSIS — F419 Anxiety disorder, unspecified: Secondary | ICD-10-CM | POA: Diagnosis present

## 2022-12-09 DIAGNOSIS — R9431 Abnormal electrocardiogram [ECG] [EKG]: Secondary | ICD-10-CM | POA: Diagnosis not present

## 2022-12-09 DIAGNOSIS — Z85828 Personal history of other malignant neoplasm of skin: Secondary | ICD-10-CM

## 2022-12-09 DIAGNOSIS — Z79899 Other long term (current) drug therapy: Secondary | ICD-10-CM | POA: Diagnosis not present

## 2022-12-09 DIAGNOSIS — E669 Obesity, unspecified: Secondary | ICD-10-CM | POA: Diagnosis present

## 2022-12-09 DIAGNOSIS — E1122 Type 2 diabetes mellitus with diabetic chronic kidney disease: Secondary | ICD-10-CM | POA: Diagnosis present

## 2022-12-09 DIAGNOSIS — J189 Pneumonia, unspecified organism: Secondary | ICD-10-CM | POA: Diagnosis present

## 2022-12-09 DIAGNOSIS — M810 Age-related osteoporosis without current pathological fracture: Secondary | ICD-10-CM | POA: Diagnosis present

## 2022-12-09 LAB — CBC WITH DIFFERENTIAL/PLATELET
Abs Immature Granulocytes: 0.06 10*3/uL (ref 0.00–0.07)
Basophils Absolute: 0.1 10*3/uL (ref 0.0–0.1)
Basophils Relative: 1 %
Eosinophils Absolute: 0.1 10*3/uL (ref 0.0–0.5)
Eosinophils Relative: 1 %
HCT: 37.2 % (ref 36.0–46.0)
Hemoglobin: 11.8 g/dL — ABNORMAL LOW (ref 12.0–15.0)
Immature Granulocytes: 1 %
Lymphocytes Relative: 10 %
Lymphs Abs: 1.1 10*3/uL (ref 0.7–4.0)
MCH: 30.8 pg (ref 26.0–34.0)
MCHC: 31.7 g/dL (ref 30.0–36.0)
MCV: 97.1 fL (ref 80.0–100.0)
Monocytes Absolute: 0.8 10*3/uL (ref 0.1–1.0)
Monocytes Relative: 7 %
Neutro Abs: 9.5 10*3/uL — ABNORMAL HIGH (ref 1.7–7.7)
Neutrophils Relative %: 80 %
Platelets: 282 10*3/uL (ref 150–400)
RBC: 3.83 MIL/uL — ABNORMAL LOW (ref 3.87–5.11)
RDW: 13 % (ref 11.5–15.5)
WBC: 11.5 10*3/uL — ABNORMAL HIGH (ref 4.0–10.5)
nRBC: 0 % (ref 0.0–0.2)

## 2022-12-09 LAB — COMPREHENSIVE METABOLIC PANEL
ALT: 11 U/L (ref 0–44)
AST: 19 U/L (ref 15–41)
Albumin: 2.8 g/dL — ABNORMAL LOW (ref 3.5–5.0)
Alkaline Phosphatase: 52 U/L (ref 38–126)
Anion gap: 12 (ref 5–15)
BUN: 19 mg/dL (ref 8–23)
CO2: 24 mmol/L (ref 22–32)
Calcium: 8.9 mg/dL (ref 8.9–10.3)
Chloride: 103 mmol/L (ref 98–111)
Creatinine, Ser: 1.2 mg/dL — ABNORMAL HIGH (ref 0.44–1.00)
GFR, Estimated: 43 mL/min — ABNORMAL LOW (ref >60)
Glucose, Bld: 121 mg/dL — ABNORMAL HIGH (ref 70–99)
Potassium: 4 mmol/L (ref 3.5–5.1)
Sodium: 139 mmol/L (ref 135–145)
Total Bilirubin: 0.8 mg/dL (ref 0.3–1.2)
Total Protein: 6.2 g/dL — ABNORMAL LOW (ref 6.5–8.1)

## 2022-12-09 LAB — TROPONIN I (HIGH SENSITIVITY)
Troponin I (High Sensitivity): 11 ng/L (ref <18)
Troponin I (High Sensitivity): 11 ng/L (ref <18)

## 2022-12-09 LAB — BRAIN NATRIURETIC PEPTIDE: B Natriuretic Peptide: 228.9 pg/mL — ABNORMAL HIGH (ref 0.0–100.0)

## 2022-12-09 MED ORDER — DOXYCYCLINE HYCLATE 100 MG PO TABS
100.0000 mg | ORAL_TABLET | Freq: Once | ORAL | Status: AC
  Administered 2022-12-09: 100 mg via ORAL
  Filled 2022-12-09: qty 1

## 2022-12-09 MED ORDER — APIXABAN 5 MG PO TABS
5.0000 mg | ORAL_TABLET | Freq: Two times a day (BID) | ORAL | Status: DC
  Administered 2022-12-09 – 2022-12-11 (×4): 5 mg via ORAL
  Filled 2022-12-09 (×4): qty 1

## 2022-12-09 MED ORDER — PROCHLORPERAZINE EDISYLATE 10 MG/2ML IJ SOLN: 5.0000 mg | Freq: Four times a day (QID) | INTRAMUSCULAR | Status: DC | PRN

## 2022-12-09 MED ORDER — VANCOMYCIN HCL 2000 MG/400ML IV SOLN
2000.0000 mg | INTRAVENOUS | Status: DC
  Administered 2022-12-09: 2000 mg via INTRAVENOUS
  Filled 2022-12-09: qty 400

## 2022-12-09 MED ORDER — ENOXAPARIN SODIUM 40 MG/0.4ML IJ SOSY
40.0000 mg | PREFILLED_SYRINGE | INTRAMUSCULAR | Status: DC
  Administered 2022-12-09: 40 mg via SUBCUTANEOUS

## 2022-12-09 MED ORDER — DILTIAZEM HCL-DEXTROSE 125-5 MG/125ML-% IV SOLN (PREMIX)
5.0000 mg/h | INTRAVENOUS | Status: DC
  Administered 2022-12-09: 5 mg/h via INTRAVENOUS
  Administered 2022-12-10: 10 mg/h via INTRAVENOUS
  Administered 2022-12-10: 15 mg/h via INTRAVENOUS
  Filled 2022-12-09 (×3): qty 125

## 2022-12-09 MED ORDER — POLYETHYLENE GLYCOL 3350 17 G PO PACK: 17.0000 g | PACK | Freq: Every day | ORAL | Status: DC | PRN

## 2022-12-09 MED ORDER — DILTIAZEM LOAD VIA INFUSION
10.0000 mg | Freq: Once | INTRAVENOUS | Status: DC
  Filled 2022-12-09: qty 10

## 2022-12-09 MED ORDER — LEVOFLOXACIN IN D5W 750 MG/150ML IV SOLN: 750.0000 mg | INTRAVENOUS | Status: DC

## 2022-12-09 MED ORDER — CITALOPRAM HYDROBROMIDE 20 MG PO TABS
40.0000 mg | ORAL_TABLET | Freq: Every day | ORAL | Status: DC
  Administered 2022-12-09 – 2022-12-11 (×3): 40 mg via ORAL
  Filled 2022-12-09 (×3): qty 2

## 2022-12-09 MED ORDER — MELATONIN 5 MG PO TABS: 5.0000 mg | ORAL_TABLET | Freq: Every evening | ORAL | Status: DC | PRN

## 2022-12-09 MED ORDER — SODIUM CHLORIDE 0.9 % IV SOLN: 1.0000 g | Freq: Once | INTRAVENOUS | Status: DC

## 2022-12-09 MED ORDER — SODIUM CHLORIDE 0.9 % IV SOLN
1.0000 g | Freq: Three times a day (TID) | INTRAVENOUS | Status: DC
  Administered 2022-12-09 – 2022-12-10 (×2): 1 g via INTRAVENOUS
  Filled 2022-12-09 (×6): qty 5

## 2022-12-09 MED ORDER — GABAPENTIN 100 MG PO CAPS
100.0000 mg | ORAL_CAPSULE | Freq: Every evening | ORAL | Status: DC
  Administered 2022-12-09 – 2022-12-10 (×2): 100 mg via ORAL
  Filled 2022-12-09 (×2): qty 1

## 2022-12-09 MED ORDER — LACTATED RINGERS IV BOLUS
500.0000 mL | Freq: Once | INTRAVENOUS | Status: AC
  Administered 2022-12-09: 500 mL via INTRAVENOUS

## 2022-12-09 MED ORDER — ACETAMINOPHEN 325 MG PO TABS: 650.0000 mg | ORAL_TABLET | Freq: Four times a day (QID) | ORAL | Status: DC | PRN

## 2022-12-09 MED ORDER — LORATADINE 10 MG PO TABS
10.0000 mg | ORAL_TABLET | Freq: Once | ORAL | Status: AC
  Administered 2022-12-09: 10 mg via ORAL
  Filled 2022-12-09: qty 1

## 2022-12-09 NOTE — ED Provider Notes (Signed)
I signed out this 89 year old presents emergency department today with generalized weakness and cough.  Her initial workup shows that she is in atrial fibrillation with RVR.  The patient was started on diltiazem and her labs and chest x-ray were pending at the time of signout.  Physical Exam  BP 115/62   Pulse 96   Temp 97.8 F (36.6 C) (Oral)   Resp 20   Ht 5\' 2"  (1.575 m)   Wt 98.9 kg   SpO2 96%   BMI 39.87 kg/m   Physical Exam Vitals and nursing note reviewed.  Constitutional:      Appearance: Normal appearance.  Cardiovascular:     Rate and Rhythm: Tachycardia present. Rhythm irregular.  Neurological:     Mental Status: She is alert.     Procedures  Procedures  ED Course / MDM    Medical Decision Making Amount and/or Complexity of Data Reviewed Labs: ordered. Radiology: ordered.  Risk OTC drugs. Prescription drug management. Decision regarding hospitalization.   The patient's workup thus far is reassuring with exception of her chest x-ray which does show pneumonia.  The patient's heart rate has improved to the low 100s and her blood pressures are stable on the diltiazem infusion.  She does have multiple allergies.  I did call and discussed this with our ED pharmacist.  She is not requiring any oxygen so it was felt that the safest course of action would be to give the patient doxycycline monotherapy as she has had rashes with cephalosporins in the past.  QTc is prolonged so we will avoid azithromycin or Levaquin for this reason.  A call was placed to hospitalist service for admission.       Durwin Glaze, MD 12/09/22 [phone]

## 2022-12-09 NOTE — Progress Notes (Signed)
PHARMACY - ANTICOAGULATION CONSULT NOTE  Pharmacy Consult for Apixaban Indication: atrial fibrillation  Allergies  Allergen Reactions   Azithromycin Hives   Dalmane [Flurazepam] Other (See Comments)    unknown   Demerol [Meperidine] Other (See Comments)    Unknown    Felodipine Other (See Comments)    unknown   Fosamax [Alendronate Sodium] Other (See Comments)    unknown   Hctz [Hydrochlorothiazide] Other (See Comments)    Unknown    Ivp Dye [Iodinated Contrast Media] Other (See Comments)    unknown   Keflin [Cephalothin] Other (See Comments)    unknown   Macrodantin [Nitrofurantoin Macrocrystal] Other (See Comments)    unknown   Morphine And Codeine Other (See Comments)    unknown   Phenergan [Promethazine Hcl] Other (See Comments)    unknown   Valium [Diazepam] Other (See Comments)    unknown   Doxycycline Rash    headache   Keflex [Cephalexin] Rash   Neosporin [Neomycin-Bacitracin Zn-Polymyx] Rash   Penicillins Rash    Patient Measurements: Height: 5\' 2"  (157.5 cm) Weight: 98.9 kg (218 lb) IBW/kg (Calculated) : 50.1  Vital Signs: Temp: 98.1 F (36.7 C) (10/02 1935) Temp Source: Oral (10/02 1935) BP: 109/51 (10/02 2000) Pulse Rate: 88 (10/02 2000)  Labs: Recent Labs    12/09/22 1515 12/09/22 1740  HGB 11.8*  --   HCT 37.2  --   PLT 282  --   CREATININE 1.20*  --   TROPONINIHS 11 11    Estimated Creatinine Clearance: 33.6 mL/min (A) (by C-G formula based on SCr of 1.2 mg/dL (H)).   Medical History: Past Medical History:  Diagnosis Date   Arthritis    Basal cell carcinoma    Breast cancer (HCC)    Depression    Hypertension    Osteoporosis    Vertigo    Assessment: 51 yof with a hx of HTN, breast cancer. Apixaban per pharmacy consult placed for new start eliquis for AF.  Age > 80 but Scr < 1.5 and 94.2 kg per bedscale in the ED.   Hgb 11.8; plt 282  Goal of Therapy:  Monitor platelets by anticoagulation protocol: Yes   Plan:   Does not meet dose reduction criteria Start eliquis 5mg  BID Monitor CBC and for s/s of hemorrhage  Delmar Landau, PharmD, BCPS 12/09/2022 9:10 PM ED Clinical Pharmacist -  [phone]

## 2022-12-09 NOTE — ED Notes (Signed)
ED TO INPATIENT HANDOFF REPORT  ED Nurse Name and Phone #: [phone]  S Name/Age/Gender Dawn Roy 89 y.o. female Room/Bed: 008C/008C  Code Status   Code Status: Limited: Do not attempt resuscitation (DNR) -DNR-LIMITED -Do Not Intubate/DNI   Home/SNF/Other Home Patient oriented to: self and place Is this baseline? Yes   Triage Complete: Triage complete  Chief Complaint Atrial fibrillation with rapid ventricular response (HCC) [I48.91]  Triage Note Pt has been weak for the past 5 days. Pt was found in bed with afib between 160-180. Cardizem 10mg  provide and rate 110.Pt with dementia, no history of afib.   Allergies Allergies  Allergen Reactions   Azithromycin Hives   Dalmane [Flurazepam] Other (See Comments)    unknown   Demerol [Meperidine] Other (See Comments)    Unknown    Felodipine Other (See Comments)    unknown   Fosamax [Alendronate Sodium] Other (See Comments)    unknown   Hctz [Hydrochlorothiazide] Other (See Comments)    Unknown    Ivp Dye [Iodinated Contrast Media] Other (See Comments)    unknown   Keflin [Cephalothin] Other (See Comments)    unknown   Macrodantin [Nitrofurantoin Macrocrystal] Other (See Comments)    unknown   Morphine And Codeine Other (See Comments)    unknown   Phenergan [Promethazine Hcl] Other (See Comments)    unknown   Valium [Diazepam] Other (See Comments)    unknown   Doxycycline Rash    headache   Keflex [Cephalexin] Rash   Neosporin [Neomycin-Bacitracin Zn-Polymyx] Rash   Penicillins Rash    Level of Care/Admitting Diagnosis ED Disposition     ED Disposition  Admit   Condition  --   Comment  Hospital [facility]: [facility] [[id number]]  Level of Care: Progressive [102]  Admit to Progressive based on following criteria: CARDIOVASCULAR & THORACIC of moderate stability with acute coronary syndrome symptoms/low risk myocardial infarction/hypertensive urgency/arrhythmias/heart failure  potentially compromising stability and stable post cardiovascular intervention patients.  May admit patient to [facility] or [facility] if equivalent level of care is available:: Yes  Covid Evaluation: Asymptomatic - no recent exposure (last 10 days) testing not required  Diagnosis: Atrial fibrillation with rapid ventricular response [facility]) [[id number]]  Admitting Physician: Darlin Drop [[id number]]  Attending Physician: Darlin Drop [[id number]]  Certification:: I certify this patient will need inpatient services for at least 2 midnights  Expected Medical Readiness: 12/11/2022          B Medical/Surgery History Past Medical History:  Diagnosis Date   Arthritis    Basal cell carcinoma    Breast cancer (HCC)    Depression    Hypertension    Osteoporosis    Vertigo    Past Surgical History:  Procedure Laterality Date   ABDOMINAL HYSTERECTOMY     BREAST RECONSTRUCTION     capsulotomy with vaginal laser     cataract surgery     CHOLECYSTECTOMY     COLONOSCOPY     DILATION AND CURETTAGE OF UTERUS     MASTECTOMY     TONSILLECTOMY       A IV Location/Drains/Wounds Patient Lines/Drains/Airways Status     Active Line/Drains/Airways     Name Placement date Placement time Site Days   Peripheral IV 12/09/22 20 G 1" Right Antecubital 12/09/22  1615  Antecubital  less than 1            Intake/Output Last 24 hours  Intake/Output Summary (Last 24 hours) at 12/09/2022  2058 Last data filed at 12/09/2022 1718 Gross per 24 hour  Intake 500 ml  Output --  Net 500 ml    Labs/Imaging Results for orders placed or performed during the hospital encounter of 12/09/22 (from the past 48 hour(s))  Comprehensive metabolic panel     Status: Abnormal   Collection Time: 12/09/22  3:15 PM  Result Value Ref Range   Sodium 139 135 - 145 mmol/L   Potassium 4.0 3.5 - 5.1 mmol/L   Chloride 103 98 - 111 mmol/L   CO2 24 22 - 32 mmol/L   Glucose, Bld 121 (H) 70 - 99 mg/dL    Comment: Glucose  reference range applies only to samples taken after fasting for at least 8 hours.   BUN 19 8 - 23 mg/dL   Creatinine, Ser 6.06 (H) 0.44 - 1.00 mg/dL   Calcium 8.9 8.9 - 30.1 mg/dL   Total Protein 6.2 (L) 6.5 - 8.1 g/dL   Albumin 2.8 (L) 3.5 - 5.0 g/dL   AST 19 15 - 41 U/L   ALT 11 0 - 44 U/L   Alkaline Phosphatase 52 38 - 126 U/L   Total Bilirubin 0.8 0.3 - 1.2 mg/dL   GFR, Estimated 43 (L) >60 mL/min    Comment: (NOTE) Calculated using the CKD-EPI Creatinine Equation (2021)    Anion gap 12 5 - 15    Comment: Performed at [facility], [address], [place], [place] [address]  Troponin I (High Sensitivity)     Status: None   Collection Time: 12/09/22  3:15 PM  Result Value Ref Range   Troponin I (High Sensitivity) 11 <18 ng/L    Comment: (NOTE) Elevated high sensitivity troponin I (hsTnI) values and significant  changes across serial measurements may suggest ACS but many other  chronic and acute conditions are known to elevate hsTnI results.  Refer to the "Links" section for chest pain algorithms and additional  guidance. Performed at [facility], [address] [place], [place] [address]   CBC with Differential     Status: Abnormal   Collection Time: 12/09/22  3:15 PM  Result Value Ref Range   WBC 11.5 (H) 4.0 - 10.5 K/uL   RBC 3.83 (L) 3.87 - 5.11 MIL/uL   Hemoglobin 11.8 (L) 12.0 - 15.0 g/dL   HCT 73.2 20.2 - 54.2 %   MCV 97.1 80.0 - 100.0 fL   MCH 30.8 26.0 - 34.0 pg   MCHC 31.7 30.0 - 36.0 g/dL   RDW 70.6 23.7 - 62.8 %   Platelets 282 150 - 400 K/uL   nRBC 0.0 0.0 - 0.2 %   Neutrophils Relative % 80 %   Neutro Abs 9.5 (H) 1.7 - 7.7 K/uL   Lymphocytes Relative 10 %   Lymphs Abs 1.1 0.7 - 4.0 K/uL   Monocytes Relative 7 %   Monocytes Absolute 0.8 0.1 - 1.0 K/uL   Eosinophils Relative 1 %   Eosinophils Absolute 0.1 0.0 - 0.5 K/uL   Basophils Relative 1 %   Basophils Absolute 0.1 0.0 - 0.1 K/uL   Immature Granulocytes 1 %   Abs Immature  Granulocytes 0.06 0.00 - 0.07 K/uL    Comment: Performed at [facility], [address], [place], [place] [address]  Brain natriuretic peptide     Status: Abnormal   Collection Time: 12/09/22  3:15 PM  Result Value Ref Range   B Natriuretic Peptide 228.9 (H) 0.0 - 100.0  pg/mL    Comment: Performed at [facility], [address] [place], [place] [address]  Troponin I (High Sensitivity)     Status: None   Collection Time: 12/09/22  5:40 PM  Result Value Ref Range   Troponin I (High Sensitivity) 11 <18 ng/L    Comment: (NOTE) Elevated high sensitivity troponin I (hsTnI) values and significant  changes across serial measurements may suggest ACS but many other  chronic and acute conditions are known to elevate hsTnI results.  Refer to the "Links" section for chest pain algorithms and additional  guidance. Performed at [facility], [address], [place], [place] [address]    DG Chest Portable 1 View  Result Date: 12/09/2022 CLINICAL DATA:  Weakness for the past 5 days.  AFib.  Dementia. EXAM: PORTABLE CHEST 1 VIEW COMPARISON:  None Available. FINDINGS: Normal heart size. Aortic atherosclerotic calcification. Hazy opacity in the right lower lung. No pleural effusion or pneumothorax. IMPRESSION: Hazy opacity in the right lower lung suspicious for pneumonia. Electronically Signed   By: Minerva Fester M.D.   On: 12/09/2022 16:15    Pending Labs Unresulted Labs (From admission, onward)     Start     Ordered   12/10/22 0500  Procalcitonin  Tomorrow morning,   R       References:    Procalcitonin Lower Respiratory Tract Infection AND Sepsis Procalcitonin Algorithm   12/09/22 2003   12/10/22 0500  CBC  Tomorrow morning,   R        12/09/22 2003   12/10/22 0500  Basic metabolic panel  Tomorrow morning,   R        12/09/22 2003   12/10/22 0500  Magnesium  Tomorrow morning,   R        12/09/22 2003   12/10/22 0500  Phosphorus  Tomorrow morning,   R        12/09/22 2003    12/09/22 2049  Urinalysis, w/ Reflex to Culture (Infection Suspected) -Urine, Clean Catch  (Urine Labs)  ONCE - URGENT,   URGENT       Question:  Specimen Source  Answer:  Urine, Clean Catch   12/09/22 2048   12/09/22 1903  Blood culture (routine x 2)  BLOOD CULTURE X 2,   R (with STAT occurrences)      12/09/22 1911            Vitals/Pain Today's Vitals   12/09/22 1900 12/09/22 1930 12/09/22 1935 12/09/22 2000  BP: (!) 113/58 118/61  (!) 109/51  Pulse: 89 (!) 40  88  Resp: 17 15  20   Temp:   98.1 F (36.7 C)   TempSrc:   Oral   SpO2: 98% 100%  95%  Weight:      Height:        Isolation Precautions No active isolations  Medications Medications  diltiazem (CARDIZEM) 125 mg in dextrose 5% 125 mL (1 mg/mL) infusion (15 mg/hr Intravenous Rate/Dose Change 12/09/22 1806)  acetaminophen (TYLENOL) tablet 650 mg (has no administration in time range)  melatonin tablet 5 mg (has no administration in time range)  prochlorperazine (COMPAZINE) injection 5 mg (has no administration in time range)  polyethylene glycol (MIRALAX / GLYCOLAX) packet 17 g (has no administration in time range)  levofloxacin (LEVAQUIN) IVPB 750 mg (has no administration in time range)  lactated ringers bolus 500 mL (0 mLs Intravenous Stopped 12/09/22 1718)  loratadine (CLARITIN) tablet 10 mg (10 mg Oral Given  12/09/22 1900)  doxycycline (VIBRA-TABS) tablet 100 mg (100 mg Oral Given 12/09/22 2028)    Mobility Walks normally with assistance but unable to today due to weakness      Focused Assessments     R Recommendations: See Admitting Provider Note  Report given to:   Additional Notes:

## 2022-12-09 NOTE — ED Provider Notes (Signed)
[place] EMERGENCY DEPARTMENT AT [facility] Provider Note   CSN: [id number] Arrival date & time: 12/09/22  1459     History  Chief Complaint  Patient presents with   Weakness   Atrial Fibrillation    Dawn Roy is a 89 y.o. female.  HPI 89 year old female [facility] via EMS for weakness.  Per their report she has been weak for about 5 days.  She was in the bed when EMS arrived.  She lives with her daughter.  No history of A-fib but they found her to be in A-fib with RVR, heart rate in the 160s.  They gave 10 mg of Cardizem and heart rate came to the low 100s.  No chest pain or shortness of breath or current symptoms from patient.   Daughter showed up after initial workup and notes patient has been more tired/fatigued over past 5 days. Today couldn't get up on her own which is atypical. No complaints of pain. No unilateral weakness.   Home Medications Prior to Admission medications   Medication Sig Start Date End Date Taking? Authorizing Provider  carvedilol (COREG) 25 MG tablet Take 25 mg by mouth 2 (two) times daily with a meal.   Yes [provider]  cholecalciferol (VITAMIN D) 1000 UNITS tablet Take 1,000 Units by mouth daily.   Yes [provider]  citalopram (CELEXA) 40 MG tablet Take 40 mg by mouth daily.   Yes [provider]  furosemide (LASIX) 20 MG tablet Take 20 mg by mouth daily as needed (for stasis detrmatitis).   Yes [provider]  gabapentin (NEURONTIN) 100 MG capsule Take 100 mg by mouth every evening.   Yes [provider]  lisinopril (PRINIVIL,ZESTRIL) 40 MG tablet Take 40 mg by mouth daily.   Yes [provider]  loratadine (CLARITIN) 10 MG tablet Take 10 mg by mouth daily as needed for allergies.   Yes [provider]  ciprofloxacin (CIPRO) 250 MG tablet Take 250 mg by mouth 2 (two) times daily. Patient not taking: Reported on 12/09/2022 12/15/18   [provider]       Allergies    Azithromycin, Dalmane [flurazepam], Demerol [meperidine], Felodipine, Fosamax [alendronate sodium], Hctz [hydrochlorothiazide], Ivp dye [iodinated contrast media], Keflin [cephalothin], Macrodantin [nitrofurantoin macrocrystal], Morphine and codeine, Phenergan [promethazine hcl], Valium [diazepam], Doxycycline, Keflex [cephalexin], Neosporin [neomycin-bacitracin zn-polymyx], and Penicillins    Review of Systems   Review of Systems  Respiratory:  Negative for shortness of breath.   Cardiovascular:  Negative for chest pain.  Neurological:  Positive for weakness.    Physical Exam Updated Vital Signs BP (!) 162/102 (BP Location: Left Arm)   Pulse (!) 123   Temp 97.8 F (36.6 C) (Oral)   Resp 18   Ht 5\' 2"  (1.575 m)   Wt 98.9 kg   SpO2 99%   BMI 39.87 kg/m  Physical Exam Vitals and nursing note reviewed.  Constitutional:      General: She is not in acute distress.    Appearance: She is well-developed. She is not ill-appearing or diaphoretic.  HENT:     Head: Normocephalic and atraumatic.  Cardiovascular:     Rate and Rhythm: Tachycardia present. Rhythm irregular.     Heart sounds: Normal heart sounds.  Pulmonary:     Effort: Pulmonary effort is normal.     Breath sounds: Normal breath sounds.  Abdominal:     General: There is no distension.     Palpations: Abdomen is soft.  Tenderness: There is no abdominal tenderness.  Musculoskeletal:     Right lower leg: No edema.     Left lower leg: No edema.  Skin:    General: Skin is warm and dry.  Neurological:     Mental Status: She is alert.     Comments: Equal strength in all 4 extremities     ED Results / Procedures / Treatments   Labs (all labs ordered are listed, but only abnormal results are displayed) Labs Reviewed  COMPREHENSIVE METABOLIC PANEL - Abnormal; Notable for the following components:      Result Value   Glucose, Bld 121 (*)    Creatinine, Ser 1.20 (*)    Total Protein 6.2 (*)     Albumin 2.8 (*)    GFR, Estimated 43 (*)    All other components within normal limits  CBC WITH DIFFERENTIAL/PLATELET - Abnormal; Notable for the following components:   WBC 11.5 (*)    RBC 3.83 (*)    Hemoglobin 11.8 (*)    Neutro Abs 9.5 (*)    All other components within normal limits  BRAIN NATRIURETIC PEPTIDE  TROPONIN I (HIGH SENSITIVITY)    EKG EKG Interpretation Date/Time:  Wednesday December 09 2022 16:00:46 EDT Ventricular Rate:  132 PR Interval:    QRS Duration:  93 QT Interval:  347 QTC Calculation: 515 R Axis:   43  Text Interpretation: Atrial fibrillation with RVR Borderline T abnormalities, inferior leads Prolonged QT interval afib new since 2000 Confirmed by Pricilla Loveless [phone]) on 12/09/2022 4:07:14 PM  Radiology No results found.  Procedures .Critical Care  Performed by: Pricilla Loveless, MD Authorized by: Pricilla Loveless, MD   Critical care provider statement:    Critical care time (minutes):  35   Critical care time was exclusive of:  Separately billable procedures and treating other patients   Critical care was necessary to treat or prevent imminent or life-threatening deterioration of the following conditions:  Cardiac failure and circulatory failure   Critical care was time spent personally by me on the following activities:  Development of treatment plan with patient or surrogate, discussions with consultants, evaluation of patient's response to treatment, examination of patient, ordering and review of laboratory studies, ordering and review of radiographic studies, ordering and performing treatments and interventions, pulse oximetry, re-evaluation of patient's condition and review of old charts     Medications Ordered in ED Medications  diltiazem (CARDIZEM) 125 mg in dextrose 5% 125 mL (1 mg/mL) infusion (has no administration in time range)  lactated ringers bolus 500 mL (has no administration in time range)    ED Course/ Medical Decision  Making/ A&P                                 Medical Decision Making Amount and/or Complexity of Data Reviewed Independent Historian: caregiver Labs: ordered.    Details: Mildly low hemoglobin Radiology: ordered. ECG/medicine tests: ordered and independent interpretation performed.    Details: Afib RVR  Risk Prescription drug management.   I suspect patient's weakness/fatigue is from A-fib.  However it sounds like this has been going on for a few days so she is not an ED cardioversion candidate.  She is still having some high heart rates and while her pressure is a little better than with EMS, initial still needing rate control.  Blood pressures are now low normal, will start some fluids and a diltiazem drip.  She will need admission after lab work and chest x-ray come back.  Care transferred to Dr. Rhae Hammock.  No chest pain or focal weakness/numbness.        Final Clinical Impression(s) / ED Diagnoses Final diagnoses:  Atrial fibrillation with RVR [facility])    Rx / DC Orders ED Discharge Orders     None         Pricilla Loveless, MD 12/09/22 [phone]

## 2022-12-09 NOTE — ED Triage Notes (Signed)
Pt has been weak for the past 5 days. Pt was found in bed with afib between 160-180. Cardizem 10mg  provide and rate 110.Pt with dementia, no history of afib.

## 2022-12-09 NOTE — Progress Notes (Signed)
Pharmacy Antibiotic Note  Dawn Roy is a 89 y.o. female for which pharmacy has been consulted for vancomycin and aztreonam dosing for pneumonia.  Patient with a history of HTN, breast cancer. Patient presenting with weakness x 5 days.  SCr 1.2 WBC 11.5; T 98.1; HR 88; RR 20  Plan: Aztreonam 1g q8h Vancomycin 2000 mg q48hr (eAUC 438.2) unless change in renal function Monitor WBC, fever, renal function, cultures De-escalate when able F/u MRSA PCR  Height: 5\' 2"  (157.5 cm) Weight: 98.9 kg (218 lb) IBW/kg (Calculated) : 50.1  Temp (24hrs), Avg:98 F (36.7 C), Min:97.8 F (36.6 C), Max:98.1 F (36.7 C)  Recent Labs  Lab 12/09/22 1515  WBC 11.5*  CREATININE 1.20*    Estimated Creatinine Clearance: 33.6 mL/min (A) (by C-G formula based on SCr of 1.2 mg/dL (H)).    Allergies  Allergen Reactions   Azithromycin Hives   Dalmane [Flurazepam] Other (See Comments)    unknown   Demerol [Meperidine] Other (See Comments)    Unknown    Felodipine Other (See Comments)    unknown   Fosamax [Alendronate Sodium] Other (See Comments)    unknown   Hctz [Hydrochlorothiazide] Other (See Comments)    Unknown    Ivp Dye [Iodinated Contrast Media] Other (See Comments)    unknown   Keflin [Cephalothin] Other (See Comments)    unknown   Macrodantin [Nitrofurantoin Macrocrystal] Other (See Comments)    unknown   Morphine And Codeine Other (See Comments)    unknown   Phenergan [Promethazine Hcl] Other (See Comments)    unknown   Valium [Diazepam] Other (See Comments)    unknown   Doxycycline Rash    headache   Keflex [Cephalexin] Rash   Neosporin [Neomycin-Bacitracin Zn-Polymyx] Rash   Penicillins Rash    Microbiology results: Pending  Thank you for allowing pharmacy to be a part of this patient's care.  Delmar Landau, PharmD, BCPS 12/09/2022 9:23 PM ED Clinical Pharmacist -  [phone]

## 2022-12-09 NOTE — H&P (Addendum)
History and Physical  RIE Dawn Roy QMV:[id number] DOB: Mar 31, 1929 DOA: 12/09/2022  Referring physician: Dr. Rhae Hammock  PCP: Lorenda Ishihara, MD  Outpatient Specialists: None Patient coming from: Home, lives with her daughter  Chief Complaint: Generalized weakness.  HPI: Dawn Roy is a 89 y.o. female with medical history significant for hypertension, chronic anxiety/depression, history of breast cancer x 2 in 1968 and 1977 status post bilateral mastectomy, who presented with complaints of generalized weakness for the past 5 days.  Also endorses a persistent cough for months.  No chest pain or shortness of breath reported.  EMS was activated and upon arrival the patient was noted to be in A-fib with RVR with heart rate in the 160s.  She received 1 dose of IV Cardizem with improvement.  In the ED, the patient was started on Cardizem drip.  Heart rate remained in the upper 90s.  A chest x-ray was done which revealed right lower lobe opacities suspicious for pneumonia.  The patient has multiple allergies, particularly for cephalosporin.  Was started on doxycycline in the ED.  TRH was asked to admit.  ED Course: Temperature 98.1.  BP 113/58, pulse 89, respiratory 17, O2 saturation 98% on room air.  Lab studies notable for WBC 11.5, hemoglobin 11.8, platelet count 282.  Creatinine 1.20 with GFR 43.  BNP 228.  Review of Systems: Review of systems as noted in the HPI. All other systems reviewed and are negative.   Past Medical History:  Diagnosis Date   Arthritis    Basal cell carcinoma    Breast cancer (HCC)    Depression    Hypertension    Osteoporosis    Vertigo    Past Surgical History:  Procedure Laterality Date   ABDOMINAL HYSTERECTOMY     BREAST RECONSTRUCTION     capsulotomy with vaginal laser     cataract surgery     CHOLECYSTECTOMY     COLONOSCOPY     DILATION AND CURETTAGE OF UTERUS     MASTECTOMY     TONSILLECTOMY      Social History:  reports that she has  never smoked. She has never used smokeless tobacco. She reports that she does not drink alcohol and does not use drugs.   Allergies  Allergen Reactions   Azithromycin Hives   Dalmane [Flurazepam] Other (See Comments)    unknown   Demerol [Meperidine] Other (See Comments)    Unknown    Felodipine Other (See Comments)    unknown   Fosamax [Alendronate Sodium] Other (See Comments)    unknown   Hctz [Hydrochlorothiazide] Other (See Comments)    Unknown    Ivp Dye [Iodinated Contrast Media] Other (See Comments)    unknown   Keflin [Cephalothin] Other (See Comments)    unknown   Macrodantin [Nitrofurantoin Macrocrystal] Other (See Comments)    unknown   Morphine And Codeine Other (See Comments)    unknown   Phenergan [Promethazine Hcl] Other (See Comments)    unknown   Valium [Diazepam] Other (See Comments)    unknown   Doxycycline Rash    headache   Keflex [Cephalexin] Rash   Neosporin [Neomycin-Bacitracin Zn-Polymyx] Rash   Penicillins Rash    Family history: None reported.  Prior to Admission medications   Medication Sig Start Date End Date Taking? Authorizing Provider  carvedilol (COREG) 25 MG tablet Take 25 mg by mouth 2 (two) times daily with a meal.   Yes [provider]  cholecalciferol (VITAMIN D) 1000 UNITS tablet  Take 1,000 Units by mouth daily.   Yes [provider]  citalopram (CELEXA) 40 MG tablet Take 40 mg by mouth daily.   Yes [provider]  furosemide (LASIX) 20 MG tablet Take 20 mg by mouth daily as needed (for stasis detrmatitis).   Yes [provider]  gabapentin (NEURONTIN) 100 MG capsule Take 100 mg by mouth every evening.   Yes [provider]  lisinopril (PRINIVIL,ZESTRIL) 40 MG tablet Take 40 mg by mouth daily.   Yes [provider]  loratadine (CLARITIN) 10 MG tablet Take 10 mg by mouth daily as needed for allergies.   Yes [provider]  ciprofloxacin (CIPRO) 250 MG tablet Take 250  mg by mouth 2 (two) times daily. Patient not taking: Reported on 12/09/2022 12/15/18   [provider]    Physical Exam: BP (!) 113/58   Pulse 89   Temp 98.1 F (36.7 C) (Oral)   Resp 17   Ht 5\' 2"  (1.575 m)   Wt 98.9 kg   SpO2 98%   BMI 39.87 kg/m   General: 89 y.o. year-old female well developed well nourished in no acute distress.  Alert and oriented x3. Cardiovascular: Irregular rate and rhythm with no rubs or gallops.  No thyromegaly or JVD noted.  No lower extremity edema. 2/4 pulses in all 4 extremities. Respiratory: Mild rales at bases.  Poor inspiratory effort. Abdomen: Soft nontender nondistended with normal bowel sounds x4 quadrants. Muskuloskeletal: No cyanosis, clubbing or edema noted bilaterally Neuro: CN II-XII intact, strength, sensation, reflexes Skin: No ulcerative lesions noted or rashes Psychiatry: Judgement and insight appear normal. Mood is appropriate for condition and setting          Labs on Admission:  Basic Metabolic Panel: Recent Labs  Lab 12/09/22 1515  NA 139  K 4.0  CL 103  CO2 24  GLUCOSE 121*  BUN 19  CREATININE 1.20*  CALCIUM 8.9   Liver Function Tests: Recent Labs  Lab 12/09/22 1515  AST 19  ALT 11  ALKPHOS 52  BILITOT 0.8  PROT 6.2*  ALBUMIN 2.8*   No results for input(s): "LIPASE", "AMYLASE" in the last 168 hours. No results for input(s): "AMMONIA" in the last 168 hours. CBC: Recent Labs  Lab 12/09/22 1515  WBC 11.5*  NEUTROABS 9.5*  HGB 11.8*  HCT 37.2  MCV 97.1  PLT 282   Cardiac Enzymes: No results for input(s): "CKTOTAL", "CKMB", "CKMBINDEX", "TROPONINI" in the last 168 hours.  BNP (last 3 results) Recent Labs    12/09/22 1515  BNP 228.9*    ProBNP (last 3 results) No results for input(s): "PROBNP" in the last 8760 hours.  CBG: No results for input(s): "GLUCAP" in the last 168 hours.  Radiological Exams on Admission: DG Chest Portable 1 View  Result Date: 12/09/2022 CLINICAL DATA:   Weakness for the past 5 days.  AFib.  Dementia. EXAM: PORTABLE CHEST 1 VIEW COMPARISON:  None Available. FINDINGS: Normal heart size. Aortic atherosclerotic calcification. Hazy opacity in the right lower lung. No pleural effusion or pneumothorax. IMPRESSION: Hazy opacity in the right lower lung suspicious for pneumonia. Electronically Signed   By: Minerva Fester M.D.   On: 12/09/2022 16:15    EKG: I independently viewed the EKG done and my findings are as followed: Atrial fibrillation rate of 132.  Nonspecific ST-T changes.  QTc 515.  Assessment/Plan Present on Admission:  Atrial fibrillation with rapid ventricular response (HCC)  Principal Problem:   Atrial fibrillation with  rapid ventricular response (HCC)  Newly diagnosed atrial fibrillation with RVR Has not seen a cardiologist in the past.  No prior history of atrial fibrillation. Received Cardizem load en route via EMS 10 mg x 1 Currently on Cardizem drip and rate controlled Not on oral anticoagulation CHA2DS2-VASc of at least 4 Will start Eliquis Obtain 2D echo Cardiology consult in the morning  Right lower lobe pneumonia, community-acquired, POA Received 1 dose of doxycycline in the ED Multiple allergies, particularly to cephalosporin IV levofloxacin daily x 5 days was held due to prolonged QTc. Continue IV doxycycline x 5 days Obtain baseline procalcitonin Maintain O2 saturation above 92% Reports coughing for months, will obtain a CT chest without contrast to further assess and rule out other causes.  Generalized weakness Suspect from pneumonia versus others Obtain UA Follow UA and urine culture  CKD 3B, or possible AKI No prior records to compare Presented with creatinine of 1.20 and GFR 43 Avoid nephrotoxic agents and hypotension  Elevated BNP BNP on presentation greater than 200 Follow 2D echo Hold off IV fluid for now to avoid pulmonary edema and volume overload.  QTc prolongation Admission to the EKG with  QTc of 550 Avoid QTc prolonging agents Optimize magnesium level greater than 2.0 Optimize potassium level greater than 4.0 Monitor on telemetry  Physical debility PT OT assessment Fall precautions.  History of breast cancer x 2 status post bilateral mastectomy Diagnosed in 1968 and 1977 Not on oral chemotherapy   Time: 75 minutes   DVT prophylaxis: Subcu Lovenox daily.  Code Status:  Limited, DO NOT INTUBATE  Family Communication: None at bedside.  Updated her daughter, Odie Santodomingo, via phone.  Disposition Plan: Admitted to progressive care unit.  Consults called: Cardiology.  Admission status: Inpatient status.   Status is: Inpatient The patient requires at least 2 midnights for further evaluation and treatment of present condition.   Darlin Drop MD [facility] Pager [phone]  If 7PM-7AM, please contact night-coverage [email or web address] Password [facility]  12/09/2022, 7:51 PM

## 2022-12-10 ENCOUNTER — Other Ambulatory Visit (HOSPITAL_COMMUNITY): Payer: Self-pay

## 2022-12-10 ENCOUNTER — Other Ambulatory Visit (HOSPITAL_COMMUNITY): Payer: Medicare Other

## 2022-12-10 ENCOUNTER — Inpatient Hospital Stay (HOSPITAL_COMMUNITY): Payer: Medicare Other

## 2022-12-10 ENCOUNTER — Encounter (HOSPITAL_COMMUNITY): Payer: Self-pay | Admitting: Internal Medicine

## 2022-12-10 DIAGNOSIS — R9431 Abnormal electrocardiogram [ECG] [EKG]: Secondary | ICD-10-CM | POA: Diagnosis not present

## 2022-12-10 DIAGNOSIS — I10 Essential (primary) hypertension: Secondary | ICD-10-CM

## 2022-12-10 DIAGNOSIS — I4891 Unspecified atrial fibrillation: Secondary | ICD-10-CM | POA: Diagnosis not present

## 2022-12-10 LAB — CBC
HCT: 33.6 % — ABNORMAL LOW (ref 36.0–46.0)
Hemoglobin: 10.6 g/dL — ABNORMAL LOW (ref 12.0–15.0)
MCH: 29.5 pg (ref 26.0–34.0)
MCHC: 31.5 g/dL (ref 30.0–36.0)
MCV: 93.6 fL (ref 80.0–100.0)
Platelets: 263 10*3/uL (ref 150–400)
RBC: 3.59 MIL/uL — ABNORMAL LOW (ref 3.87–5.11)
RDW: 13.1 % (ref 11.5–15.5)
WBC: 10.4 10*3/uL (ref 4.0–10.5)
nRBC: 0 % (ref 0.0–0.2)

## 2022-12-10 LAB — BASIC METABOLIC PANEL
Anion gap: 10 (ref 5–15)
BUN: 23 mg/dL (ref 8–23)
CO2: 25 mmol/L (ref 22–32)
Calcium: 8.4 mg/dL — ABNORMAL LOW (ref 8.9–10.3)
Chloride: 100 mmol/L (ref 98–111)
Creatinine, Ser: 1.32 mg/dL — ABNORMAL HIGH (ref 0.44–1.00)
GFR, Estimated: 38 mL/min — ABNORMAL LOW (ref >60)
Glucose, Bld: 104 mg/dL — ABNORMAL HIGH (ref 70–99)
Potassium: 4 mmol/L (ref 3.5–5.1)
Sodium: 135 mmol/L (ref 135–145)

## 2022-12-10 LAB — URINALYSIS, W/ REFLEX TO CULTURE (INFECTION SUSPECTED)
Bacteria, UA: NONE SEEN
Bilirubin Urine: NEGATIVE
Glucose, UA: NEGATIVE mg/dL
Hgb urine dipstick: NEGATIVE
Ketones, ur: 5 mg/dL — AB
Leukocytes,Ua: NEGATIVE
Nitrite: NEGATIVE
Protein, ur: NEGATIVE mg/dL
Specific Gravity, Urine: 1.021 (ref 1.005–1.030)
pH: 5 (ref 5.0–8.0)

## 2022-12-10 LAB — PHOSPHORUS: Phosphorus: 4.1 mg/dL (ref 2.5–4.6)

## 2022-12-10 LAB — PROCALCITONIN: Procalcitonin: <0.1 ng/mL

## 2022-12-10 LAB — ECHOCARDIOGRAM COMPLETE
AR max vel: 2.1 cm2
AV Peak grad: 8.6 mm[Hg]
Ao pk vel: 1.47 m/s
Area-P 1/2: 4.78 cm2
Height: 63 in
MV M vel: 2.37 m/s
MV Peak grad: 22.5 mm[Hg]
S' Lateral: 2.1 cm
Weight: 2994.73 [oz_av]

## 2022-12-10 LAB — MAGNESIUM: Magnesium: 1.8 mg/dL (ref 1.7–2.4)

## 2022-12-10 LAB — TSH: TSH: 1.366 u[IU]/mL (ref 0.350–4.500)

## 2022-12-10 LAB — MRSA NEXT GEN BY PCR, NASAL: MRSA by PCR Next Gen: NOT DETECTED

## 2022-12-10 MED ORDER — METOPROLOL SUCCINATE ER 50 MG PO TB24
50.0000 mg | ORAL_TABLET | Freq: Every day | ORAL | Status: DC
  Administered 2022-12-10 – 2022-12-11 (×2): 50 mg via ORAL
  Filled 2022-12-10 (×2): qty 1

## 2022-12-10 MED ORDER — FUROSEMIDE 10 MG/ML IJ SOLN
20.0000 mg | Freq: Once | INTRAMUSCULAR | Status: AC
  Administered 2022-12-10: 20 mg via INTRAVENOUS
  Filled 2022-12-10: qty 2

## 2022-12-10 MED ORDER — MAGNESIUM SULFATE 2 GM/50ML IV SOLN
2.0000 g | Freq: Once | INTRAVENOUS | Status: AC
  Administered 2022-12-10: 2 g via INTRAVENOUS
  Filled 2022-12-10: qty 50

## 2022-12-10 NOTE — Progress Notes (Signed)
  Echocardiogram 2D Echocardiogram has been performed.  Dawn Roy 12/10/2022, 1:40 PM

## 2022-12-10 NOTE — Evaluation (Signed)
Physical Therapy Evaluation Patient Details Name: Dawn Roy MRN: [id number] DOB: Nov 08, 1929 Today's Date: 12/10/2022  History of Present Illness  89 yo female presents to [facility] on 10/2 with weakness x5 days, afib with RVR. Also with RLL PNA. PMH includes breast cancer, depression, OP, abdominal hysterectomy, cholecystectomy.  Clinical Impression   Pt presents with generalized weakness, impaired balance with history of falls, impaired activity tolerance. Pt to benefit from acute PT to address deficits. Pt tolerated stand pivot transfer to recliner this date with light physical assist, typically pt is a household ambulator with RW but is fatigued today vs baseline. Pt lives at home with her daughter as caregiver, recommending increased support from daughter and HHPT at d/c. PT to progress mobility as tolerated, and will continue to follow acutely.          If plan is discharge home, recommend the following: A little help with walking and/or transfers;A little help with bathing/dressing/bathroom   Can travel by private vehicle        Equipment Recommendations None recommended by PT  Recommendations for Other Services       Functional Status Assessment Patient has had a recent decline in their functional status and demonstrates the ability to make significant improvements in function in a reasonable and predictable amount of time.     Precautions / Restrictions Precautions Precautions: Fall Restrictions Weight Bearing Restrictions: No      Mobility  Bed Mobility Overal bed mobility: Needs Assistance Bed Mobility: Supine to Sit     Supine to sit: Min assist, HOB elevated     General bed mobility comments: assist for trunk elevation and scooting to EOB. Cues for sequencing    Transfers Overall transfer level: Needs assistance Equipment used: Rolling walker (2 wheels) Transfers: Sit to/from Stand, Bed to chair/wheelchair/BSC Sit to Stand: Min assist   Step pivot  transfers: Min assist       General transfer comment: assist to rise and steady, pivot to recliner towards L.    Ambulation/Gait               General Gait Details: pt declines due to fatigue  Stairs            Wheelchair Mobility     Tilt Bed    Modified Rankin (Stroke Patients Only)       Balance Overall balance assessment: Needs assistance, History of Falls Sitting-balance support: No upper extremity supported, Feet supported Sitting balance-Leahy Scale: Fair     Standing balance support: Bilateral upper extremity supported, During functional activity, Reliant on assistive device for balance Standing balance-Leahy Scale: Poor                               Pertinent Vitals/Pain Pain Assessment Pain Assessment: No/denies pain    Home Living Family/patient expects to be discharged to:: Private residence Living Arrangements: Children Available Help at Discharge: Family Type of Home: House Home Access: Stairs to enter   Secretary/administrator of Steps: 2   Home Layout: One level Home Equipment: Shower seat - built in;Grab bars - tub/shower;Grab bars - toilet;Rolling Walker (2 wheels);Wheelchair - power      Prior Function Prior Level of Function : Needs assist;History of Falls (last six months)             Mobility Comments: uses RW for gait, recent falls but not in her home ADLs Comments: assist for bathing and meals  Extremity/Trunk Assessment   Upper Extremity Assessment Upper Extremity Assessment: Defer to OT evaluation    Lower Extremity Assessment Lower Extremity Assessment: Generalized weakness    Cervical / Trunk Assessment Cervical / Trunk Assessment: Kyphotic  Communication      Cognition Arousal: Alert Behavior During Therapy: WFL for tasks assessed/performed Overall Cognitive Status: Within Functional Limits for tasks assessed                                          General  Comments      Exercises     Assessment/Plan    PT Assessment Patient needs continued PT services  PT Problem List Decreased strength;Decreased mobility;Decreased activity tolerance;Decreased balance;Decreased knowledge of use of DME;Pain;Decreased safety awareness;Decreased knowledge of precautions       PT Treatment Interventions DME instruction;Therapeutic activities;Gait training;Therapeutic exercise;Patient/family education;Balance training;Stair training;Functional mobility training;Neuromuscular re-education    PT Goals (Current goals can be found in the Care Plan section)  Acute Rehab PT Goals PT Goal Formulation: With patient Time For Goal Achievement: 12/24/22 Potential to Achieve Goals: Good    Frequency Min 1X/week     Co-evaluation               AM-PAC PT "6 Clicks" Mobility  Outcome Measure Help needed turning from your back to your side while in a flat bed without using bedrails?: A Little Help needed moving from lying on your back to sitting on the side of a flat bed without using bedrails?: A Little Help needed moving to and from a bed to a chair (including a wheelchair)?: A Little Help needed standing up from a chair using your arms (e.g., wheelchair or bedside chair)?: A Little Help needed to walk in hospital room?: A Lot Help needed climbing 3-5 steps with a railing? : A Lot 6 Click Score: 16    End of Session   Activity Tolerance: Patient tolerated treatment well;Patient limited by fatigue Patient left: in chair;with call bell/phone within reach;with chair alarm set;with family/visitor present Nurse Communication: Mobility status PT Visit Diagnosis: Other abnormalities of gait and mobility (R26.89);Muscle weakness (generalized) (M62.81)    Time: 3086-5784 PT Time Calculation (min) (ACUTE ONLY): 20 min   Charges:   PT Evaluation $PT Eval Low Complexity: 1 Low   PT General Charges $$ ACUTE PT VISIT: 1 Visit         Marye Round, PT  DPT Acute Rehabilitation Services Secure Chat Preferred  Office [phone]   Krysten Veronica E Christain [place] 12/10/2022, 12:05 PM

## 2022-12-10 NOTE — TOC Benefit Eligibility Note (Addendum)
Patient Product/process development scientist completed.    The patient is insured through [facility]. Patient has Medicare and is not eligible for a copay card, but may be able to apply for patient assistance, if available.    Ran test claim for Eliquis 5 mg and the current 30 day co-pay is $279.21 due to a deductible.  Ran test claim for Xarelto 20 mg and the current 30 day co-pay is $279.21 due to a deductible.  This test claim was processed through [facility]- copay amounts may vary at other pharmacies due to pharmacy/plan contracts, or as the patient moves through the different stages of their insurance plan.     Roland Earl, CPHT Pharmacy Technician III Certified Patient Advocate [facility] Patient Advocate Team Direct Number: [phone]  Fax: [phone]

## 2022-12-10 NOTE — TOC Initial Note (Signed)
Transition of Care [facility]) - Initial/Assessment Note    Patient Details  Name: Dawn Roy MRN: [id number] Date of Birth: [birth year]  [facility] CM/SW Contact:    Gala Lewandowsky, RN Phone Number: 12/10/2022, 4:01 PM  Clinical Narrative:  Patient presented for atrial fib. PTA patient was from home with support of daughter. Patient has DME: tub bench, rolling walker, and wheelchair. PT/OT recommendations for [facility] PT- Case Manager spoke with patient regarding [facility] Services. Patient wants to wait and review the Medicare.gov list and choose an agency. Case Manager will follow back up in the morning with choice.                   Expected Discharge Plan: Home w Home Health Services Barriers to Discharge: No Barriers Identified   Patient Goals and CMS Choice Patient states their goals for this hospitalization and ongoing recovery are:: to return home   Expected Discharge Plan and Services   Discharge Planning Services: CM Consult Post Acute Care Choice: Home Health Living arrangements for the past 2 months: Single Family Home                   DME Agency: NA       HH Arranged: PT   Prior Living Arrangements/Services Living arrangements for the past 2 months: Single Family Home Lives with:: Adult Children Patient language and need for interpreter reviewed:: Yes Do you feel safe going back to the place where you live?: Yes      Need for Family Participation in Patient Care: Yes (Comment) Care giver support system in place?: Yes (comment) Current home services: DME (tub bench, rolling walker, wheelchair,) Criminal Activity/Legal Involvement Pertinent to Current Situation/Hospitalization: No - Comment as needed  Activities of Daily Living   ADL Screening (condition at time of admission) Independently performs ADLs?: No Does the patient have a NEW difficulty with bathing/dressing/toileting/self-feeding that is expected to last >3 days?: No Does the  patient have a NEW difficulty with getting in/out of bed, walking, or climbing stairs that is expected to last >3 days?: No Does the patient have a NEW difficulty with communication that is expected to last >3 days?: No Is the patient deaf or have difficulty hearing?: No Does the patient have difficulty seeing, even when wearing glasses/contacts?: No Does the patient have difficulty concentrating, remembering, or making decisions?: No  Permission Sought/Granted Permission sought to share information with : Family Supports, Magazine features editor, Case Estate manager/land agent granted to share information with : Yes, Verbal Permission Granted    Emotional Assessment Appearance:: Appears stated age Attitude/Demeanor/Rapport: Engaged Affect (typically observed): Appropriate Orientation: : Oriented to Self, Oriented to Place, Oriented to  Time, Oriented to Situation Alcohol / Substance Use: Not Applicable Psych Involvement: No (comment)  Admission diagnosis:  Atrial fibrillation with rapid ventricular response (HCC) [I48.91] Atrial fibrillation with RVR (HCC) [I48.91] Pneumonia due to infectious organism, unspecified laterality, unspecified part of lung [J18.9] Patient Active Problem List   Diagnosis Date Noted   Primary hypertension 12/10/2022   Atrial fibrillation with rapid ventricular response (HCC) 12/09/2022   PCP:  Lorenda Ishihara, MD Pharmacy:   [id number] [place], [place] - [address] AT [facility] [address] [place] [address] Phone: [phone] Fax: [phone]  [facility] [address] [place] [address] Phone: [phone] Fax: [phone]  Social Determinants of Health (SDOH) Social History: SDOH Screenings  Food Insecurity: No Food Insecurity (12/10/2022)  Housing: Low Risk  (12/10/2022)  Transportation Needs: No Transportation Needs (12/10/2022)   Utilities: Not At Risk (12/10/2022)  Tobacco Use: Low Risk  (12/10/2022)    Readmission Risk Interventions     No data to display

## 2022-12-10 NOTE — Progress Notes (Signed)
PROGRESS NOTE    Dawn ALLERY  Roy:[id number] DOB: [birth year] DOA: 12/09/2022 PCP: Lorenda Ishihara, MD    Brief Narrative:   Dawn Roy is a 89 y.o. female with medical history significant for hypertension, chronic anxiety/depression, history of breast cancer x 2 in 1968 and 1977 status post bilateral mastectomy, who presented with complaints of generalized weakness for the past 5 days.  Also endorses a persistent cough for months.    EMS was activated and upon arrival the patient was noted to be in A-fib with RVR with heart rate in the 160s.  She received 1 dose of IV Cardizem with improvement.    Assessment and Plan: Newly diagnosed atrial fibrillation with RVR -Has not seen a cardiologist in the past.  No prior history of atrial fibrillation. -Received Cardizem load en route via EMS 10 mg x 1 -Currently on Cardizem drip and rate controlled -Not on oral anticoagulation -CHA2DS2-VASc of at least 4 - Eliquis - 2D echo -Cardiology consult   DOUBT Right lower lobe pneumonia, community-acquired, POA D/c abx   Generalized weakness -PT Eval -suspect from a fib   CKD 3B, or possible AKI No prior records to compare Presented with creatinine of 1.20 and GFR 43 -trend   Elevated BNP BNP on presentation greater than 200 Echo pending   QTc prolongation -replace K to 4 and Mg to 2   History of breast cancer x 2 status post bilateral mastectomy Diagnosed in 1968 and 1977 Not on oral chemotherapy  Obesity Estimated body mass index is 33.16 kg/m as calculated from the following:   Height as of this encounter: 5\' 3"  (1.6 m).   Weight as of this encounter: 84.9 kg.   DVT prophylaxis:  apixaban (ELIQUIS) tablet 5 mg    Code Status: Limited: Do not attempt resuscitation (DNR) -DNR-LIMITED -Do Not Intubate/DNI  Family Communication: granddaughter at bedside  Disposition Plan:  Level of care: Progressive Status is: Inpatient Remains inpatient appropriate     Consultants:  cards   Subjective: + cough but no fever  Objective: Vitals:   12/09/22 2151 12/10/22 0347 12/10/22 0348 12/10/22 0826  BP: (!) 143/67  (!) 117/46 (!) 139/59  Pulse: 90  73 80  Resp: 19  18 (!) 23  Temp: 98.6 F (37 C) 98 F (36.7 C) 98.3 F (36.8 C) 98.7 F (37.1 C)  TempSrc: Oral  Oral Oral  SpO2: 99%  97% 96%  Weight: 84.9 kg     Height: 5\' 3"  (1.6 m)       Intake/Output Summary (Last 24 hours) at 12/10/2022 1033 Last data filed at 12/10/2022 0100 Gross per 24 hour  Intake 1060.74 ml  Output 50 ml  Net 1010.74 ml   Filed Weights   12/09/22 1508 12/09/22 2151  Weight: 98.9 kg 84.9 kg    Examination:   General: Appearance:    Obese female in no acute distress     Lungs:     Diminished, respirations unlabored  Heart:    Normal heart rate. Irregular but rate controlled    MS:   All extremities are intact.    Neurologic:   Awake, alert       Data Reviewed: I have personally reviewed following labs and imaging studies  CBC: Recent Labs  Lab 12/09/22 1515 12/10/22 0413  WBC 11.5* 10.4  NEUTROABS 9.5*  --   HGB 11.8* 10.6*  HCT 37.2 33.6*  MCV 97.1 93.6  PLT 282 263   Basic Metabolic Panel: Recent  Labs  Lab 12/09/22 1515 12/10/22 0413  NA 139 135  K 4.0 4.0  CL 103 100  CO2 24 25  GLUCOSE 121* 104*  BUN 19 23  CREATININE 1.20* 1.32*  CALCIUM 8.9 8.4*  MG  --  1.8  PHOS  --  4.1   GFR: Estimated Creatinine Clearance: 28.7 mL/min (A) (by C-G formula based on SCr of 1.32 mg/dL (H)). Liver Function Tests: Recent Labs  Lab 12/09/22 1515  AST 19  ALT 11  ALKPHOS 52  BILITOT 0.8  PROT 6.2*  ALBUMIN 2.8*   No results for input(s): "LIPASE", "AMYLASE" in the last 168 hours. No results for input(s): "AMMONIA" in the last 168 hours. Coagulation Profile: No results for input(s): "INR", "PROTIME" in the last 168 hours. Cardiac Enzymes: No results for input(s): "CKTOTAL", "CKMB", "CKMBINDEX", "TROPONINI" in the last 168  hours. BNP (last 3 results) No results for input(s): "PROBNP" in the last 8760 hours. HbA1C: No results for input(s): "HGBA1C" in the last 72 hours. CBG: No results for input(s): "GLUCAP" in the last 168 hours. Lipid Profile: No results for input(s): "CHOL", "HDL", "LDLCALC", "TRIG", "CHOLHDL", "LDLDIRECT" in the last 72 hours. Thyroid Function Tests: No results for input(s): "TSH", "T4TOTAL", "FREET4", "T3FREE", "THYROIDAB" in the last 72 hours. Anemia Panel: No results for input(s): "VITAMINB12", "FOLATE", "FERRITIN", "TIBC", "IRON", "RETICCTPCT" in the last 72 hours. Sepsis Labs: Recent Labs  Lab 12/10/22 0413  PROCALCITON <0.10    Recent Results (from the past 240 hour(s))  MRSA Next Gen by PCR, Nasal     Status: None   Collection Time: 12/10/22  1:05 AM   Specimen: Nasal Mucosa; Nasal Swab  Result Value Ref Range Status   MRSA by PCR Next Gen NOT DETECTED NOT DETECTED Final    Comment: (NOTE) The GeneXpert MRSA Assay (FDA approved for NASAL specimens only), is one component of a comprehensive MRSA colonization surveillance program. It is not intended to diagnose MRSA infection nor to guide or monitor treatment for MRSA infections. Test performance is not FDA approved in patients less than 35 years old. Performed at [facility], [address], [place], [place] [address]          Radiology Studies: CT CHEST WO CONTRAST  Result Date: 12/10/2022 CLINICAL DATA:  Pneumonia, complication suspected, xray done EXAM: CT CHEST WITHOUT CONTRAST TECHNIQUE: Multidetector CT imaging of the chest was performed following the standard protocol without IV contrast. RADIATION DOSE REDUCTION: This exam was performed according to the departmental dose-optimization program which includes automated exposure control, adjustment of the mA and/or kV according to patient size and/or use of iterative reconstruction technique. COMPARISON:  Chest x-ray 12/09/2022 FINDINGS: Cardiovascular:  Heart is normal size. Aorta is normal caliber. Aortic atherosclerosis. Mediastinum/Nodes: No mediastinal, hilar, or axillary adenopathy. Trachea and esophagus are unremarkable. Left thyroid nodule measuring 3 cm. In the setting of significant comorbidities or limited life expectancy, no follow-up recommended (ref: J Am Coll Radiol. 2015 Feb;12(2): 143-50). Lungs/Pleura: Lungs are clear. No focal airspace opacities or suspicious nodules. No effusions. The density seen on prior chest x-ray likely reflected the right breast implant superimposed over the right lower lobe. No effusions. Upper Abdomen: No acute findings Musculoskeletal: Bilateral breast implants. Chest wall soft tissues are unremarkable. No acute bony abnormality. IMPRESSION: No confluent airspace opacities. No acute cardiopulmonary disease. Aortic Atherosclerosis (ICD10-I70.0). Electronically Signed   By: Charlett Nose M.D.   On: 12/10/2022 00:52   DG Chest Portable 1 View  Result Date: 12/09/2022  CLINICAL DATA:  Weakness for the past 5 days.  AFib.  Dementia. EXAM: PORTABLE CHEST 1 VIEW COMPARISON:  None Available. FINDINGS: Normal heart size. Aortic atherosclerotic calcification. Hazy opacity in the right lower lung. No pleural effusion or pneumothorax. IMPRESSION: Hazy opacity in the right lower lung suspicious for pneumonia. Electronically Signed   By: Minerva Fester M.D.   On: 12/09/2022 16:15        Scheduled Meds:  apixaban  5 mg Oral BID   citalopram  40 mg Oral Daily   furosemide  20 mg Intravenous Once   gabapentin  100 mg Oral QPM   Continuous Infusions:  diltiazem (CARDIZEM) infusion 15 mg/hr (12/10/22 0052)   magnesium sulfate bolus IVPB       LOS: 1 day    Time spent: 45 minutes spent on chart review, discussion with nursing staff, consultants, updating family and interview/physical exam; more than 50% of that time was spent in counseling and/or coordination of care.    Joseph Art, DO [facility] Available via Epic secure chat 7am-7pm After these hours, please refer to coverage provider listed on [email or web address] 12/10/2022, 10:33 AM

## 2022-12-10 NOTE — Consult Note (Signed)
Cardiology Consultation:   Patient ID: Dawn Roy MRN: [id number]; DOB: 1929-11-06  Admit date: 12/09/2022 Date of Consult: 12/10/2022  Primary Care Provider: Lorenda Ishihara, MD [facility] Cardiologist: None  [facility] Electrophysiologist:  None    Patient Profile:   Dawn Roy is a 89 y.o. female with a hx of hypertension, chronic anxiety/depression, breast cancer x 2 status post bilateral mastectomy who is being seen today for the evaluation of new onset atrial fibrillation at the request of Dawn Canary DO.     History of Present Illness:   Dawn Roy is a 89 year old female with a history of hypertension, chronic anxiety/depression, breast cancer x 2 status post bilateral mastectomy who presented to the emergency room with 5-day history of generalized weakness associated with persistent cough for several months.  She called EMS and upon their arrival patient was in A-fib with RVR to 160 bpm.  She was given 1 dose of IV Cardizem.    In the ER she was started on a Cardizem drip with improvement in heart rate to the 90s.  Chest x-ray showed right lower lobe opacities suspicious for pneumonia.  She was started on antibiotics.  BP was stable at 113/58 mmHg and O2 sats 90% on room air.  WBC 11.5, hemoglobin 11.8, serum creatinine 1.2 and BNP 228.  Neurology is now asked to consult for new onset atrial fibrillation.  Patient denies any history of chest pain or pressure, shortness of breath, PND, orthopnea, lower extremity Dema, palpitations or syncope.  Past Medical History:  Diagnosis Date   Arthritis    Basal cell carcinoma    Breast cancer (HCC)    Depression    Hypertension    Osteoporosis    Vertigo     Past Surgical History:  Procedure Laterality Date   ABDOMINAL HYSTERECTOMY     BREAST RECONSTRUCTION     capsulotomy with vaginal laser     cataract surgery     CHOLECYSTECTOMY     COLONOSCOPY     DILATION AND CURETTAGE OF UTERUS      MASTECTOMY     TONSILLECTOMY       Home Medications:  Prior to Admission medications   Medication Sig Start Date End Date Taking? Authorizing Provider  carvedilol (COREG) 25 MG tablet Take 25 mg by mouth 2 (two) times daily with a meal.   Yes [provider]  cholecalciferol (VITAMIN D) 1000 UNITS tablet Take 1,000 Units by mouth daily.   Yes [provider]  citalopram (CELEXA) 40 MG tablet Take 40 mg by mouth daily.   Yes [provider]  furosemide (LASIX) 20 MG tablet Take 20 mg by mouth daily as needed (for stasis detrmatitis).   Yes [provider]  gabapentin (NEURONTIN) 100 MG capsule Take 100 mg by mouth every evening.   Yes [provider]  lisinopril (PRINIVIL,ZESTRIL) 40 MG tablet Take 40 mg by mouth daily.   Yes [provider]  loratadine (CLARITIN) 10 MG tablet Take 10 mg by mouth daily as needed for allergies.   Yes [provider]  ciprofloxacin (CIPRO) 250 MG tablet Take 250 mg by mouth 2 (two) times daily. Patient not taking: Reported on 12/09/2022 12/15/18   [provider]    Inpatient Medications: Scheduled Meds:  apixaban  5 mg Oral BID   citalopram  40 mg Oral Daily   gabapentin  100 mg Oral QPM   Continuous Infusions:  diltiazem (CARDIZEM) infusion 10 mg/hr (12/10/22 1052)  PRN Meds: acetaminophen, melatonin, polyethylene glycol, prochlorperazine  Allergies:    Allergies  Allergen Reactions   Azithromycin Hives   Dalmane [Flurazepam] Other (See Comments)    unknown   Demerol [Meperidine] Other (See Comments)    Unknown    Felodipine Other (See Comments)    unknown   Fosamax [Alendronate Sodium] Other (See Comments)    unknown   Hctz [Hydrochlorothiazide] Other (See Comments)    Unknown    Ivp Dye [Iodinated Contrast Media] Other (See Comments)    unknown   Keflin [Cephalothin] Other (See Comments)    unknown   Macrodantin [Nitrofurantoin Macrocrystal] Other (See Comments)     unknown   Morphine And Codeine Other (See Comments)    unknown   Phenergan [Promethazine Hcl] Other (See Comments)    unknown   Valium [Diazepam] Other (See Comments)    unknown   Doxycycline Rash    headache   Keflex [Cephalexin] Rash   Neosporin [Neomycin-Bacitracin Zn-Polymyx] Rash   Penicillins Rash    Social History:   Social History   Socioeconomic History   Marital status: Married    Spouse name: Not on file   Number of children: Not on file   Years of education: Not on file   Highest education level: Not on file  Occupational History   Not on file  Tobacco Use   Smoking status: Never   Smokeless tobacco: Never  Vaping Use   Vaping status: Never Used  Substance and Sexual Activity   Alcohol use: No   Drug use: No   Sexual activity: Not Currently  Other Topics Concern   Not on file  Social History Narrative   Not on file   Social Determinants of Health   Financial Resource Strain: Not on file  Food Insecurity: No Food Insecurity (12/10/2022)   Hunger Vital Sign    Worried About Running Out of Food in the Last Year: Never true    Ran Out of Food in the Last Year: Never true  Transportation Needs: No Transportation Needs (12/10/2022)   PRAPARE - Administrator, Civil Service (Medical): No    Lack of Transportation (Non-Medical): No  Physical Activity: Not on file  Stress: Not on file  Social Connections: Not on file  Intimate Partner Violence: Not At Risk (12/10/2022)   Humiliation, Afraid, Rape, and Kick questionnaire    Fear of Current or Ex-Partner: No    Emotionally Abused: No    Physically Abused: No    Sexually Abused: No    Family History:   History reviewed. No pertinent family history.   ROS:  Please see the history of present illness.   All other ROS reviewed and negative.     Physical Exam/Data:   Vitals:   12/10/22 0347 12/10/22 0348 12/10/22 0826 12/10/22 1203  BP:  (!) 117/46 (!) 139/59 123/61  Pulse:  73 80 (!) 51   Resp:  18 (!) 23 20  Temp: 98 F (36.7 C) 98.3 F (36.8 C) 98.7 F (37.1 C) 99.4 F (37.4 C)  TempSrc:  Oral Oral Oral  SpO2:  97% 96% 97%  Weight:      Height:        Intake/Output Summary (Last 24 hours) at 12/10/2022 1330 Last data filed at 12/10/2022 1037 Gross per 24 hour  Intake 1060.74 ml  Output 250 ml  Net 810.74 ml      12/09/2022    9:51 PM 12/09/2022    3:08 PM  12/04/2015    7:00 PM  Last 3 Weights  Weight (lbs) 187 lb 2.7 oz 218 lb 218 lb  Weight (kg) 84.9 kg 98.884 kg 98.884 kg     Body mass index is 33.16 kg/m.  General:  Well nourished, well developed, in no acute distress HEENT: normal Lymph: no adenopathy Neck: no JVD Endocrine:  No thryomegaly Vascular: No carotid bruits; FA pulses 2+ bilaterally without bruits  Cardiac:  normal S1, S2; irregularly irregular; no murmur  Lungs:  clear to auscultation bilaterally, no wheezing, rhonchi or rales  Abd: soft, nontender, no hepatomegaly  Ext: no edema Musculoskeletal:  No deformities, BUE and BLE strength normal and equal Skin: warm and dry  Neuro:  CNs 2-12 intact, no focal abnormalities noted Psych:  Normal affect   EKG:  The EKG was personally reviewed and demonstrates: Atrial fibrillation with RVR Telemetry:  Telemetry was personally reviewed and demonstrates: Atrial fibrillation with heart rate in the 90s  Laboratory Data:  High Sensitivity Troponin:   Recent Labs  Lab 12/09/22 1515 12/09/22 1740  TROPONINIHS 11 11     Chemistry Recent Labs  Lab 12/09/22 1515 12/10/22 0413  NA 139 135  K 4.0 4.0  CL 103 100  CO2 24 25  GLUCOSE 121* 104*  BUN 19 23  CREATININE 1.20* 1.32*  CALCIUM 8.9 8.4*  GFRNONAA 43* 38*  ANIONGAP 12 10    Recent Labs  Lab 12/09/22 1515  PROT 6.2*  ALBUMIN 2.8*  AST 19  ALT 11  ALKPHOS 52  BILITOT 0.8   Hematology Recent Labs  Lab 12/09/22 1515 12/10/22 0413  WBC 11.5* 10.4  RBC 3.83* 3.59*  HGB 11.8* 10.6*  HCT 37.2 33.6*  MCV 97.1 93.6  MCH  30.8 29.5  MCHC 31.7 31.5  RDW 13.0 13.1  PLT 282 263   BNP Recent Labs  Lab 12/09/22 1515  BNP 228.9*    DDimer No results for input(s): "DDIMER" in the last 168 hours.   Radiology/Studies:  CT CHEST WO CONTRAST  Result Date: 12/10/2022 CLINICAL DATA:  Pneumonia, complication suspected, xray done EXAM: CT CHEST WITHOUT CONTRAST TECHNIQUE: Multidetector CT imaging of the chest was performed following the standard protocol without IV contrast. RADIATION DOSE REDUCTION: This exam was performed according to the departmental dose-optimization program which includes automated exposure control, adjustment of the mA and/or kV according to patient size and/or use of iterative reconstruction technique. COMPARISON:  Chest x-ray 12/09/2022 FINDINGS: Cardiovascular: Heart is normal size. Aorta is normal caliber. Aortic atherosclerosis. Mediastinum/Nodes: No mediastinal, hilar, or axillary adenopathy. Trachea and esophagus are unremarkable. Left thyroid nodule measuring 3 cm. In the setting of significant comorbidities or limited life expectancy, no follow-up recommended (ref: J Am Coll Radiol. 2015 Feb;12(2): 143-50). Lungs/Pleura: Lungs are clear. No focal airspace opacities or suspicious nodules. No effusions. The density seen on prior chest x-ray likely reflected the right breast implant superimposed over the right lower lobe. No effusions. Upper Abdomen: No acute findings Musculoskeletal: Bilateral breast implants. Chest wall soft tissues are unremarkable. No acute bony abnormality. IMPRESSION: No confluent airspace opacities. No acute cardiopulmonary disease. Aortic Atherosclerosis (ICD10-I70.0). Electronically Signed   By: Charlett Nose M.D.   On: 12/10/2022 00:52   DG Chest Portable 1 View  Result Date: 12/09/2022 CLINICAL DATA:  Weakness for the past 5 days.  AFib.  Dementia. EXAM: PORTABLE CHEST 1 VIEW COMPARISON:  None Available. FINDINGS: Normal heart size. Aortic atherosclerotic calcification.  Hazy opacity in the right lower lung. No pleural  effusion or pneumothorax. IMPRESSION: Hazy opacity in the right lower lung suspicious for pneumonia. Electronically Signed   By: Minerva Fester M.D.   On: 12/09/2022 16:15     Assessment and Plan:   New onset atrial fibrillation -Unknown duration of A-fib patient has had a cough for several months and has had weakness for a week.  Diagnosed with possible pneumonia which may be the trigger of patient's A-fib -Started on IV Cardizem drip and heart rate now in the 50 to 60s and blood pressure stable at 123/61 mmHg -CHADS2VASC score 4 -Has been started on apixaban 5 mg twice daily. -Stop IV Cardizem drip -She was on Carvedilol 25 mg twice daily at home which we will transition to Toprol XL 50 mg daily for better heart rate control -Check 2D echo to assess left atrial size and LV function  Hypertension -BP controlled on exam -She was on carvedilol 25 mg twice daily, lisinopril 40 mg daily at home -Stop Carvedilol and start Toprol XL 50mg  daily and hold on lisinopril for now until heart rate is adequately controlled   CHA2DS2-VASc Score = 4   This indicates a 4.8% annual risk of stroke. The patient's score is based upon: CHF History: 0 HTN History: 1 Diabetes History: 0 Stroke History: 0 Vascular Disease History: 0 Age Score: 2 Gender Score: 1      For questions or updates, please contact [facility] Please consult [email or web address] for contact info under    Signed, Armanda Magic, MD  12/10/2022 1:30 PM

## 2022-12-11 ENCOUNTER — Other Ambulatory Visit (HOSPITAL_COMMUNITY): Payer: Self-pay

## 2022-12-11 DIAGNOSIS — I4891 Unspecified atrial fibrillation: Secondary | ICD-10-CM | POA: Diagnosis not present

## 2022-12-11 DIAGNOSIS — I10 Essential (primary) hypertension: Secondary | ICD-10-CM | POA: Diagnosis not present

## 2022-12-11 LAB — CBC
HCT: 33.8 % — ABNORMAL LOW (ref 36.0–46.0)
Hemoglobin: 10.9 g/dL — ABNORMAL LOW (ref 12.0–15.0)
MCH: 31.1 pg (ref 26.0–34.0)
MCHC: 32.2 g/dL (ref 30.0–36.0)
MCV: 96.3 fL (ref 80.0–100.0)
Platelets: 241 10*3/uL (ref 150–400)
RBC: 3.51 MIL/uL — ABNORMAL LOW (ref 3.87–5.11)
RDW: 13.2 % (ref 11.5–15.5)
WBC: 8.3 10*3/uL (ref 4.0–10.5)
nRBC: 0 % (ref 0.0–0.2)

## 2022-12-11 LAB — BASIC METABOLIC PANEL
Anion gap: 10 (ref 5–15)
BUN: 22 mg/dL (ref 8–23)
CO2: 25 mmol/L (ref 22–32)
Calcium: 8.3 mg/dL — ABNORMAL LOW (ref 8.9–10.3)
Chloride: 97 mmol/L — ABNORMAL LOW (ref 98–111)
Creatinine, Ser: 1.41 mg/dL — ABNORMAL HIGH (ref 0.44–1.00)
GFR, Estimated: 35 mL/min — ABNORMAL LOW (ref >60)
Glucose, Bld: 89 mg/dL (ref 70–99)
Potassium: 3.8 mmol/L (ref 3.5–5.1)
Sodium: 132 mmol/L — ABNORMAL LOW (ref 135–145)

## 2022-12-11 LAB — PROCALCITONIN: Procalcitonin: <0.1 ng/mL

## 2022-12-11 MED ORDER — GUAIFENESIN ER 600 MG PO TB12
600.0000 mg | ORAL_TABLET | Freq: Two times a day (BID) | ORAL | Status: DC
  Administered 2022-12-11: 600 mg via ORAL
  Filled 2022-12-11: qty 1

## 2022-12-11 MED ORDER — METOPROLOL SUCCINATE ER 50 MG PO TB24
50.0000 mg | ORAL_TABLET | Freq: Every day | ORAL | 0 refills | Status: DC
  Filled 2022-12-11: qty 90, 90d supply, fill #0

## 2022-12-11 MED ORDER — APIXABAN 5 MG PO TABS
5.0000 mg | ORAL_TABLET | Freq: Two times a day (BID) | ORAL | 0 refills | Status: DC
Start: 2022-12-11 — End: 2022-12-28
  Filled 2022-12-11: qty 60, 30d supply, fill #0

## 2022-12-11 MED ORDER — GUAIFENESIN ER 600 MG PO TB12: 600.0000 mg | ORAL_TABLET | Freq: Two times a day (BID) | ORAL | Status: DC | PRN

## 2022-12-11 NOTE — Evaluation (Signed)
Occupational Therapy Evaluation Patient Details Name: Dawn Roy MRN: [id number] DOB: 10-28-1929 Today's Date: 12/11/2022   History of Present Illness 89 yo female presents to [facility] on 10/2 with weakness x5 days, afib with RVR. Also with RLL PNA. PMH includes breast cancer, depression, OP, abdominal hysterectomy, cholecystectomy.   Clinical Impression   PTA, pt lived with daughter who provided assist with bathing and meals. Upon eval, pt needing assistance for LB ADL and light assist for transfers. Pt daughter reports close to baseline. Pt with generalized weakness, decr balance, and knowledge of safe use of DME. OT to continue to follow and recommending discharge home with HHOT. Daughter issued gait belt and educated regarding safe use.        If plan is discharge home, recommend the following: A little help with walking and/or transfers;A lot of help with bathing/dressing/bathroom;Assistance with cooking/housework;Assist for transportation;Help with stairs or ramp for entrance    Functional Status Assessment  Patient has had a recent decline in their functional status and demonstrates the ability to make significant improvements in function in a reasonable and predictable amount of time.  Equipment Recommendations  None recommended by OT    Recommendations for Other Services       Precautions / Restrictions Precautions Precautions: Fall Restrictions Weight Bearing Restrictions: No      Mobility Bed Mobility Overal bed mobility: Needs Assistance Bed Mobility: Supine to Sit     Supine to sit: Min assist, HOB elevated     General bed mobility comments: assist for trunk elevation and scooting to EOB. Cues for sequencing    Transfers Overall transfer level: Needs assistance Equipment used: Rolling walker (2 wheels) Transfers: Sit to/from Stand, Bed to chair/wheelchair/BSC Sit to Stand: Min assist     Step pivot transfers: Contact guard assist     General  transfer comment: Light assist to rise      Balance Overall balance assessment: Needs assistance, History of Falls Sitting-balance support: No upper extremity supported, Feet supported Sitting balance-Leahy Scale: Fair     Standing balance support: Bilateral upper extremity supported, During functional activity, Reliant on assistive device for balance Standing balance-Leahy Scale: Poor                             ADL either performed or assessed with clinical judgement   ADL Overall ADL's : Needs assistance/impaired Eating/Feeding: Set up;Sitting   Grooming: Set up;Sitting   Upper Body Bathing: Set up;Sitting   Lower Body Bathing: Moderate assistance;Sit to/from stand   Upper Body Dressing : Set up;Sitting   Lower Body Dressing: Maximal assistance;Sit to/from stand   Toilet Transfer: Contact guard assist;Rolling walker (2 wheels);Ambulation;Comfort height toilet           Functional mobility during ADLs: Contact guard assist       Vision Ability to See in Adequate Light: 0 Adequate Patient Visual Report: No change from baseline Vision Assessment?: Wears glasses for reading Additional Comments: [facility] for tasks assessed     Perception Perception: Not tested       Praxis Praxis: Not tested       Pertinent Vitals/Pain Pain Assessment Pain Assessment: No/denies pain     Extremity/Trunk Assessment Upper Extremity Assessment Upper Extremity Assessment: Generalized weakness   Lower Extremity Assessment Lower Extremity Assessment: Generalized weakness   Cervical / Trunk Assessment Cervical / Trunk Assessment: Kyphotic   Communication Communication Communication: No apparent difficulties   Cognition Arousal: Alert Behavior  During Therapy: WFL for tasks assessed/performed Overall Cognitive Status: Within Functional Limits for tasks assessed                                 General Comments: for basic ADL tasks. Pt forgetful;  executive function not formally assessed     General Comments  VSS    Exercises     Shoulder Instructions      Home Living Family/patient expects to be discharged to:: Private residence Living Arrangements: Children Available Help at Discharge: Family Type of Home: House Home Access: Stairs to enter Secretary/administrator of Steps: 2   Home Layout: One level     Bathroom Shower/Tub: Walk-in shower         Home Equipment: Shower seat - built in;Grab bars - tub/shower;Grab bars - toilet;Rolling Environmental consultant (2 wheels);Wheelchair - power;BSC/3in1          Prior Functioning/Environment Prior Level of Function : Needs assist;History of Falls (last six months)             Mobility Comments: uses RW for gait, recent falls but not in her home ADLs Comments: assist for bathing and meals        OT Problem List: Decreased strength;Decreased activity tolerance;Impaired balance (sitting and/or standing)      OT Treatment/Interventions: Self-care/ADL training;Therapeutic exercise;DME and/or AE instruction;Balance training;Patient/family education;Therapeutic activities    OT Goals(Current goals can be found in the care plan section) Acute Rehab OT Goals Patient Stated Goal: get better OT Goal Formulation: With patient Time For Goal Achievement: 12/25/22 Potential to Achieve Goals: Good  OT Frequency: Min 1X/week    Co-evaluation              AM-PAC OT "6 Clicks" Daily Activity     Outcome Measure Help from another person eating meals?: None Help from another person taking care of personal grooming?: A Little Help from another person toileting, which includes using toliet, bedpan, or urinal?: A Little Help from another person bathing (including washing, rinsing, drying)?: A Lot Help from another person to put on and taking off regular upper body clothing?: A Little Help from another person to put on and taking off regular lower body clothing?: A Lot 6 Click Score:  17   End of Session Equipment Utilized During Treatment: Gait belt;Rolling walker (2 wheels) Nurse Communication: Mobility status  Activity Tolerance: Patient tolerated treatment well Patient left: in bed;with call bell/phone within reach;with bed alarm set;with nursing/sitter in room;with family/visitor present  OT Visit Diagnosis: Unsteadiness on feet (R26.81);Muscle weakness (generalized) (M62.81);History of falling (Z91.81)                Time: 1011-1030 OT Time Calculation (min): 19 min Charges:  OT General Charges $OT Visit: 1 Visit OT Evaluation $OT Eval Low Complexity: 1 Low  Tyler Deis, OTR/L [facility] Acute Rehabilitation Office: [phone]   Myrla Halsted 12/11/2022, 12:11 PM

## 2022-12-11 NOTE — Discharge Summary (Signed)
imaging, microbiology, ancillary and laboratory) are listed below for reference.     Procedures and Diagnostic Studies:   ECHOCARDIOGRAM COMPLETE  Result Date: 12/10/2022    ECHOCARDIOGRAM REPORT   Patient Name:   Dawn Roy Date of Exam: 12/10/2022 Medical Rec #:  [id number]          Height:       63.0 in Accession #:    [id number]         Weight:       187.2 lb Date of Birth:  1929/11/22           BSA:          1.880 m Patient Age:    89 years           BP:           139/59 mmHg Patient Gender: F                  HR:           81 bpm. Exam Location:  Inpatient Procedure: 2D Echo, Cardiac Doppler and  Color Doppler Indications:    Abnormal ECG R94.31  History:        Patient has no prior history of Echocardiogram examinations.                 Arrythmias:Atrial Fibrillation; Risk Factors:Hypertension.                 Breast cancer.  Sonographer:    Lucendia Herrlich RCS Referring Phys: [id number] CAROLE N HALL IMPRESSIONS  1. Left ventricular ejection fraction, by estimation, is 60 to 65%. The left ventricle has normal function. The left ventricle has no regional wall motion abnormalities. There is moderate concentric left ventricular hypertrophy. Left ventricular diastolic function could not be evaluated.  2. Right ventricular systolic function is normal. The right ventricular size is normal.  3. Left atrial size was moderately dilated.  4. Right atrial size was moderately dilated.  5. The mitral valve is degenerative. Trivial mitral valve regurgitation. No evidence of mitral stenosis. Moderate mitral annular calcification.  6. The aortic valve is tricuspid. There is mild calcification of the aortic valve. Aortic valve regurgitation is trivial. Aortic valve sclerosis/calcification is present, without any evidence of aortic stenosis.  7. The inferior vena cava is dilated in size with <50% respiratory variability, suggesting right atrial pressure of 15 mmHg. FINDINGS  Left Ventricle: Left ventricular ejection fraction, by estimation, is 60 to 65%. The left ventricle has normal function. The left ventricle has no regional wall motion abnormalities. The left ventricular internal cavity size was normal in size. There is  moderate concentric left ventricular hypertrophy. Left ventricular diastolic function could not be evaluated due to atrial fibrillation. Left ventricular diastolic function could not be evaluated. Right Ventricle: The right ventricular size is normal. No increase in right ventricular wall thickness. Right ventricular systolic function is normal. Left Atrium: Left atrial size was moderately dilated. Right  Atrium: Right atrial size was moderately dilated. Pericardium: There is no evidence of pericardial effusion. Mitral Valve: The mitral valve is degenerative in appearance. Moderate mitral annular calcification. Trivial mitral valve regurgitation. No evidence of mitral valve stenosis. Tricuspid Valve: The tricuspid valve is normal in structure. Tricuspid valve regurgitation is trivial. No evidence of tricuspid stenosis. Aortic Valve: The aortic valve is tricuspid. There is mild calcification of the aortic valve. Aortic valve regurgitation is trivial. Aortic valve sclerosis/calcification is present, without any evidence of aortic stenosis.  Physician Discharge Summary  Dawn Roy:[id number] DOB: 1929/06/28 DOA: 12/09/2022  PCP: Lorenda Ishihara, MD  Admit date: 12/09/2022 Discharge date: 12/11/2022  Admitted From: home Discharge disposition: home   Recommendations for Outpatient Follow-Up:   BMP 1 week Home health   Discharge Diagnosis:   Principal Problem:   Atrial fibrillation with rapid ventricular response (HCC) Active Problems:   Primary hypertension    Discharge Condition: Improved.  Diet recommendation: Low sodium, heart healthy.   Wound care: None.  Code status: DNR   History of Present Illness:   Dawn Roy is a 89 y.o. female with medical history significant for hypertension, chronic anxiety/depression, history of breast cancer x 2 in 1968 and 1977 status post bilateral mastectomy, who presented with complaints of generalized weakness for the past 5 days.  Also endorses a persistent cough for months.  No chest pain or shortness of breath reported.  EMS was activated and upon arrival the patient was noted to be in A-fib with RVR with heart rate in the 160s.  She received 1 dose of IV Cardizem with improvement.   In the ED, the patient was started on Cardizem drip.  Heart rate remained in the upper 90s.  A chest x-ray was done which revealed right lower lobe opacities suspicious for pneumonia.  The patient has multiple allergies, particularly for cephalosporin.  Was started on doxycycline in the ED.  TRH was asked to admit.   Hospital Course by Problem:   Newly diagnosed atrial fibrillation with RVR -Has not seen a cardiologist in the past.  No prior history of atrial fibrillation. -Cardiology consult:-Has been started on apixaban 5 mg twice daily. 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   Weight as of this encounter: 84.9 kg.     Medical Consultants:   cards   Discharge Exam:   Vitals:   12/11/22 0515 12/11/22 0841  BP: (!) 114/44 114/65  Pulse: 60 84  Resp: 18 20  Temp: 99.5 F (37.5 C) 98.6 F (37 C)  SpO2: 98% 97%   Vitals:   12/10/22 2031 12/10/22 2311 12/11/22 0515 12/11/22 0841  BP: (!) 118/46 (!) 122/48 (!) 114/44 114/65  Pulse: 83 98 60 84  Resp: (!) 22 20 18 20   Temp: 98.2 F (36.8 C) 98.2 F (36.8 C) 99.5 F (37.5 C) 98.6 F (37 C)  TempSrc: Oral Oral Oral Oral  SpO2: 96% 96% 98% 97%  Weight:   86.7 kg   Height:        General exam: Appears calm and comfortable.    The results of significant diagnostics from this hospitalization (including  Physician Discharge Summary  Dawn Roy:[id number] DOB: 1929/06/28 DOA: 12/09/2022  PCP: Lorenda Ishihara, MD  Admit date: 12/09/2022 Discharge date: 12/11/2022  Admitted From: home Discharge disposition: home   Recommendations for Outpatient Follow-Up:   BMP 1 week Home health   Discharge Diagnosis:   Principal Problem:   Atrial fibrillation with rapid ventricular response (HCC) Active Problems:   Primary hypertension    Discharge Condition: Improved.  Diet recommendation: Low sodium, heart healthy.   Wound care: None.  Code status: DNR   History of Present Illness:   Dawn Roy is a 89 y.o. female with medical history significant for hypertension, chronic anxiety/depression, history of breast cancer x 2 in 1968 and 1977 status post bilateral mastectomy, who presented with complaints of generalized weakness for the past 5 days.  Also endorses a persistent cough for months.  No chest pain or shortness of breath reported.  EMS was activated and upon arrival the patient was noted to be in A-fib with RVR with heart rate in the 160s.  She received 1 dose of IV Cardizem with improvement.   In the ED, the patient was started on Cardizem drip.  Heart rate remained in the upper 90s.  A chest x-ray was done which revealed right lower lobe opacities suspicious for pneumonia.  The patient has multiple allergies, particularly for cephalosporin.  Was started on doxycycline in the ED.  TRH was asked to admit.   Hospital Course by Problem:   Newly diagnosed atrial fibrillation with RVR -Has not seen a cardiologist in the past.  No prior history of atrial fibrillation. -Cardiology consult:-Has been started on apixaban 5 mg twice daily. 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   Weight as of this encounter: 84.9 kg.     Medical Consultants:   cards   Discharge Exam:   Vitals:   12/11/22 0515 12/11/22 0841  BP: (!) 114/44 114/65  Pulse: 60 84  Resp: 18 20  Temp: 99.5 F (37.5 C) 98.6 F (37 C)  SpO2: 98% 97%   Vitals:   12/10/22 2031 12/10/22 2311 12/11/22 0515 12/11/22 0841  BP: (!) 118/46 (!) 122/48 (!) 114/44 114/65  Pulse: 83 98 60 84  Resp: (!) 22 20 18 20   Temp: 98.2 F (36.8 C) 98.2 F (36.8 C) 99.5 F (37.5 C) 98.6 F (37 C)  TempSrc: Oral Oral Oral Oral  SpO2: 96% 96% 98% 97%  Weight:   86.7 kg   Height:        General exam: Appears calm and comfortable.    The results of significant diagnostics from this hospitalization (including  Physician Discharge Summary  Dawn Roy:[id number] DOB: 1929/06/28 DOA: 12/09/2022  PCP: Lorenda Ishihara, MD  Admit date: 12/09/2022 Discharge date: 12/11/2022  Admitted From: home Discharge disposition: home   Recommendations for Outpatient Follow-Up:   BMP 1 week Home health   Discharge Diagnosis:   Principal Problem:   Atrial fibrillation with rapid ventricular response (HCC) Active Problems:   Primary hypertension    Discharge Condition: Improved.  Diet recommendation: Low sodium, heart healthy.   Wound care: None.  Code status: DNR   History of Present Illness:   Dawn Roy is a 89 y.o. female with medical history significant for hypertension, chronic anxiety/depression, history of breast cancer x 2 in 1968 and 1977 status post bilateral mastectomy, who presented with complaints of generalized weakness for the past 5 days.  Also endorses a persistent cough for months.  No chest pain or shortness of breath reported.  EMS was activated and upon arrival the patient was noted to be in A-fib with RVR with heart rate in the 160s.  She received 1 dose of IV Cardizem with improvement.   In the ED, the patient was started on Cardizem drip.  Heart rate remained in the upper 90s.  A chest x-ray was done which revealed right lower lobe opacities suspicious for pneumonia.  The patient has multiple allergies, particularly for cephalosporin.  Was started on doxycycline in the ED.  TRH was asked to admit.   Hospital Course by Problem:   Newly diagnosed atrial fibrillation with RVR -Has not seen a cardiologist in the past.  No prior history of atrial fibrillation. -Cardiology consult:-Has been started on apixaban 5 mg twice daily. 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   Weight as of this encounter: 84.9 kg.     Medical Consultants:   cards   Discharge Exam:   Vitals:   12/11/22 0515 12/11/22 0841  BP: (!) 114/44 114/65  Pulse: 60 84  Resp: 18 20  Temp: 99.5 F (37.5 C) 98.6 F (37 C)  SpO2: 98% 97%   Vitals:   12/10/22 2031 12/10/22 2311 12/11/22 0515 12/11/22 0841  BP: (!) 118/46 (!) 122/48 (!) 114/44 114/65  Pulse: 83 98 60 84  Resp: (!) 22 20 18 20   Temp: 98.2 F (36.8 C) 98.2 F (36.8 C) 99.5 F (37.5 C) 98.6 F (37 C)  TempSrc: Oral Oral Oral Oral  SpO2: 96% 96% 98% 97%  Weight:   86.7 kg   Height:        General exam: Appears calm and comfortable.    The results of significant diagnostics from this hospitalization (including  imaging, microbiology, ancillary and laboratory) are listed below for reference.     Procedures and Diagnostic Studies:   ECHOCARDIOGRAM COMPLETE  Result Date: 12/10/2022    ECHOCARDIOGRAM REPORT   Patient Name:   Dawn Roy Date of Exam: 12/10/2022 Medical Rec #:  [id number]          Height:       63.0 in Accession #:    [id number]         Weight:       187.2 lb Date of Birth:  1929/11/22           BSA:          1.880 m Patient Age:    89 years           BP:           139/59 mmHg Patient Gender: F                  HR:           81 bpm. Exam Location:  Inpatient Procedure: 2D Echo, Cardiac Doppler and  Color Doppler Indications:    Abnormal ECG R94.31  History:        Patient has no prior history of Echocardiogram examinations.                 Arrythmias:Atrial Fibrillation; Risk Factors:Hypertension.                 Breast cancer.  Sonographer:    Lucendia Herrlich RCS Referring Phys: [id number] CAROLE N HALL IMPRESSIONS  1. Left ventricular ejection fraction, by estimation, is 60 to 65%. The left ventricle has normal function. The left ventricle has no regional wall motion abnormalities. There is moderate concentric left ventricular hypertrophy. Left ventricular diastolic function could not be evaluated.  2. Right ventricular systolic function is normal. The right ventricular size is normal.  3. Left atrial size was moderately dilated.  4. Right atrial size was moderately dilated.  5. The mitral valve is degenerative. Trivial mitral valve regurgitation. No evidence of mitral stenosis. Moderate mitral annular calcification.  6. The aortic valve is tricuspid. There is mild calcification of the aortic valve. Aortic valve regurgitation is trivial. Aortic valve sclerosis/calcification is present, without any evidence of aortic stenosis.  7. The inferior vena cava is dilated in size with <50% respiratory variability, suggesting right atrial pressure of 15 mmHg. FINDINGS  Left Ventricle: Left ventricular ejection fraction, by estimation, is 60 to 65%. The left ventricle has normal function. The left ventricle has no regional wall motion abnormalities. The left ventricular internal cavity size was normal in size. There is  moderate concentric left ventricular hypertrophy. Left ventricular diastolic function could not be evaluated due to atrial fibrillation. Left ventricular diastolic function could not be evaluated. Right Ventricle: The right ventricular size is normal. No increase in right ventricular wall thickness. Right ventricular systolic function is normal. Left Atrium: Left atrial size was moderately dilated. Right  Atrium: Right atrial size was moderately dilated. Pericardium: There is no evidence of pericardial effusion. Mitral Valve: The mitral valve is degenerative in appearance. Moderate mitral annular calcification. Trivial mitral valve regurgitation. No evidence of mitral valve stenosis. Tricuspid Valve: The tricuspid valve is normal in structure. Tricuspid valve regurgitation is trivial. No evidence of tricuspid stenosis. Aortic Valve: The aortic valve is tricuspid. There is mild calcification of the aortic valve. Aortic valve regurgitation is trivial. Aortic valve sclerosis/calcification is present, without any evidence of aortic stenosis.  imaging, microbiology, ancillary and laboratory) are listed below for reference.     Procedures and Diagnostic Studies:   ECHOCARDIOGRAM COMPLETE  Result Date: 12/10/2022    ECHOCARDIOGRAM REPORT   Patient Name:   Dawn Roy Date of Exam: 12/10/2022 Medical Rec #:  [id number]          Height:       63.0 in Accession #:    [id number]         Weight:       187.2 lb Date of Birth:  1929/11/22           BSA:          1.880 m Patient Age:    89 years           BP:           139/59 mmHg Patient Gender: F                  HR:           81 bpm. Exam Location:  Inpatient Procedure: 2D Echo, Cardiac Doppler and  Color Doppler Indications:    Abnormal ECG R94.31  History:        Patient has no prior history of Echocardiogram examinations.                 Arrythmias:Atrial Fibrillation; Risk Factors:Hypertension.                 Breast cancer.  Sonographer:    Lucendia Herrlich RCS Referring Phys: [id number] CAROLE N HALL IMPRESSIONS  1. Left ventricular ejection fraction, by estimation, is 60 to 65%. The left ventricle has normal function. The left ventricle has no regional wall motion abnormalities. There is moderate concentric left ventricular hypertrophy. Left ventricular diastolic function could not be evaluated.  2. Right ventricular systolic function is normal. The right ventricular size is normal.  3. Left atrial size was moderately dilated.  4. Right atrial size was moderately dilated.  5. The mitral valve is degenerative. Trivial mitral valve regurgitation. No evidence of mitral stenosis. Moderate mitral annular calcification.  6. The aortic valve is tricuspid. There is mild calcification of the aortic valve. Aortic valve regurgitation is trivial. Aortic valve sclerosis/calcification is present, without any evidence of aortic stenosis.  7. The inferior vena cava is dilated in size with <50% respiratory variability, suggesting right atrial pressure of 15 mmHg. FINDINGS  Left Ventricle: Left ventricular ejection fraction, by estimation, is 60 to 65%. The left ventricle has normal function. The left ventricle has no regional wall motion abnormalities. The left ventricular internal cavity size was normal in size. There is  moderate concentric left ventricular hypertrophy. Left ventricular diastolic function could not be evaluated due to atrial fibrillation. Left ventricular diastolic function could not be evaluated. Right Ventricle: The right ventricular size is normal. No increase in right ventricular wall thickness. Right ventricular systolic function is normal. Left Atrium: Left atrial size was moderately dilated. Right  Atrium: Right atrial size was moderately dilated. Pericardium: There is no evidence of pericardial effusion. Mitral Valve: The mitral valve is degenerative in appearance. Moderate mitral annular calcification. Trivial mitral valve regurgitation. No evidence of mitral valve stenosis. Tricuspid Valve: The tricuspid valve is normal in structure. Tricuspid valve regurgitation is trivial. No evidence of tricuspid stenosis. Aortic Valve: The aortic valve is tricuspid. There is mild calcification of the aortic valve. Aortic valve regurgitation is trivial. Aortic valve sclerosis/calcification is present, without any evidence of aortic stenosis.

## 2022-12-11 NOTE — Progress Notes (Addendum)
Progress Note  Patient Name: Dawn Roy Date of Encounter: 12/11/2022  [facility] Cardiologist: None    Subjective   Admitted with new onset atrial fibrillation of unknown duration.  Triggered by pneumonia. Inpatient Medications    Scheduled Meds:  apixaban  5 mg Oral BID   citalopram  40 mg Oral Daily   gabapentin  100 mg Oral QPM   metoprolol succinate  50 mg Oral Daily   Continuous Infusions:  PRN Meds: acetaminophen, melatonin, polyethylene glycol, prochlorperazine   Vital Signs    Vitals:   12/10/22 1815 12/10/22 2031 12/10/22 2311 12/11/22 0515  BP: (!) 125/59 (!) 118/46 (!) 122/48 (!) 114/44  Pulse:  83 98 60  Resp: (!) 21 (!) 22 20 18   Temp:  98.2 F (36.8 C) 98.2 F (36.8 C) 99.5 F (37.5 C)  TempSrc:  Oral Oral Oral  SpO2:  96% 96% 98%  Weight:    86.7 kg  Height:        Intake/Output Summary (Last 24 hours) at 12/11/2022 0758 Last data filed at 12/11/2022 0600 Gross per 24 hour  Intake 1086.72 ml  Output 1000 ml  Net 86.72 ml      12/11/2022    5:15 AM 12/09/2022    9:51 PM 12/09/2022    3:08 PM  Last 3 Weights  Weight (lbs) 191 lb 3.2 oz 187 lb 2.7 oz 218 lb  Weight (kg) 86.728 kg 84.9 kg 98.884 kg      Telemetry    Atrial fibrillation with controlled ventricular response- Personally Reviewed  ECG    No new EKG to review- Personally Reviewed  Physical Exam   GEN: No acute distress.   Neck: No JVD Cardiac: Irregularly irregular, no murmurs, rubs, or gallops.  Respiratory: Clear to auscultation bilaterally. GI: Soft, nontender, non-distended  MS: No edema; No deformity. Neuro:  Nonfocal  Psych: Normal affect   Labs    High Sensitivity Troponin:   Recent Labs  Lab 12/09/22 1515 12/09/22 1740  TROPONINIHS 11 11      Chemistry Recent Labs  Lab 12/09/22 1515 12/10/22 0413 12/11/22 0543  NA 139 135 132*  K 4.0 4.0 3.8  CL 103 100 97*  CO2 24 25 25   GLUCOSE 121* 104* 89  BUN 19 23 22   CREATININE 1.20*  1.32* 1.41*  CALCIUM 8.9 8.4* 8.3*  PROT 6.2*  --   --   ALBUMIN 2.8*  --   --   AST 19  --   --   ALT 11  --   --   ALKPHOS 52  --   --   BILITOT 0.8  --   --   GFRNONAA 43* 38* 35*  ANIONGAP 12 10 10      Hematology Recent Labs  Lab 12/09/22 1515 12/10/22 0413 12/11/22 0543  WBC 11.5* 10.4 8.3  RBC 3.83* 3.59* 3.51*  HGB 11.8* 10.6* 10.9*  HCT 37.2 33.6* 33.8*  MCV 97.1 93.6 96.3  MCH 30.8 29.5 31.1  MCHC 31.7 31.5 32.2  RDW 13.0 13.1 13.2  PLT 282 263 241    BNP Recent Labs  Lab 12/09/22 1515  BNP 228.9*     DDimer No results for input(s): "DDIMER" in the last 168 hours.   CHA2DS2-VASc Score = 4   This indicates a 4.8% annual risk of stroke. The patient's score is based upon: CHF History: 0 HTN History: 1 Diabetes History: 0 Stroke History: 0 Vascular Disease History: 0 Age Score: 2  Gender Score: 1      Radiology    ECHOCARDIOGRAM COMPLETE  Result Date: 12/10/2022    ECHOCARDIOGRAM REPORT   Patient Name:   Dawn Roy Date of Exam: 12/10/2022 Medical Rec #:  [id number]          Height:       63.0 in Accession #:    [id number]         Weight:       187.2 lb Date of Birth:  03-11-1929           BSA:          1.880 m Patient Age:    89 years           BP:           139/59 mmHg Patient Gender: F                  HR:           81 bpm. Exam Location:  Inpatient Procedure: 2D Echo, Cardiac Doppler and Color Doppler Indications:    Abnormal ECG R94.31  History:        Patient has no prior history of Echocardiogram examinations.                 Arrythmias:Atrial Fibrillation; Risk Factors:Hypertension.                 Breast cancer.  Sonographer:    Lucendia Herrlich RCS Referring Phys: [id number] CAROLE N HALL IMPRESSIONS  1. Left ventricular ejection fraction, by estimation, is 60 to 65%. The left ventricle has normal function. The left ventricle has no regional wall motion abnormalities. There is moderate concentric left ventricular hypertrophy. Left ventricular  diastolic function could not be evaluated.  2. Right ventricular systolic function is normal. The right ventricular size is normal.  3. Left atrial size was moderately dilated.  4. Right atrial size was moderately dilated.  5. The mitral valve is degenerative. Trivial mitral valve regurgitation. No evidence of mitral stenosis. Moderate mitral annular calcification.  6. The aortic valve is tricuspid. There is mild calcification of the aortic valve. Aortic valve regurgitation is trivial. Aortic valve sclerosis/calcification is present, without any evidence of aortic stenosis.  7. The inferior vena cava is dilated in size with <50% respiratory variability, suggesting right atrial pressure of 15 mmHg. FINDINGS  Left Ventricle: Left ventricular ejection fraction, by estimation, is 60 to 65%. The left ventricle has normal function. The left ventricle has no regional wall motion abnormalities. The left ventricular internal cavity size was normal in size. There is  moderate concentric left ventricular hypertrophy. Left ventricular diastolic function could not be evaluated due to atrial fibrillation. Left ventricular diastolic function could not be evaluated. Right Ventricle: The right ventricular size is normal. No increase in right ventricular wall thickness. Right ventricular systolic function is normal. Left Atrium: Left atrial size was moderately dilated. Right Atrium: Right atrial size was moderately dilated. Pericardium: There is no evidence of pericardial effusion. Mitral Valve: The mitral valve is degenerative in appearance. Moderate mitral annular calcification. Trivial mitral valve regurgitation. No evidence of mitral valve stenosis. Tricuspid Valve: The tricuspid valve is normal in structure. Tricuspid valve regurgitation is trivial. No evidence of tricuspid stenosis. Aortic Valve: The aortic valve is tricuspid. There is mild calcification of the aortic valve. Aortic valve regurgitation is trivial. Aortic valve  sclerosis/calcification is present, without any evidence of aortic stenosis. Aortic valve peak gradient measures 8.6 mmHg. Pulmonic  Valve: The pulmonic valve was grossly normal. Pulmonic valve regurgitation is not visualized. No evidence of pulmonic stenosis. Aorta: The aortic root is normal in size and structure. Venous: The inferior vena cava is dilated in size with less than 50% respiratory variability, suggesting right atrial pressure of 15 mmHg. IAS/Shunts: No atrial level shunt detected by color flow Doppler.  LEFT VENTRICLE PLAX 2D LVIDd:         2.90 cm   Diastology LVIDs:         2.10 cm   LV e' medial:    11.00 cm/s LV PW:         1.00 cm   LV E/e' medial:  14.7 LV IVS:        0.90 cm   LV e' lateral:   11.70 cm/s LVOT diam:     2.00 cm   LV E/e' lateral: 13.8 LV SV:         62 LV SV Index:   33 LVOT Area:     3.14 cm  RIGHT VENTRICLE             IVC RV S prime:     20.10 cm/s  IVC diam: 2.50 cm TAPSE (M-mode): 1.4 cm LEFT ATRIUM             Index        RIGHT ATRIUM           Index LA diam:        4.20 cm 2.23 cm/m   RA Area:     23.30 cm LA Vol (A2C):   59.6 ml 31.70 ml/m  RA Volume:   65.90 ml  35.05 ml/m LA Vol (A4C):   86.7 ml 46.12 ml/m LA Biplane Vol: 80.0 ml 42.56 ml/m  AORTIC VALVE                 PULMONIC VALVE AV Area (Vmax): 2.10 cm     PR End Diast Vel: 3.82 msec AV Vmax:        147.00 cm/s AV Peak Grad:   8.6 mmHg LVOT Vmax:      98.47 cm/s LVOT Vmean:     64.267 cm/s LVOT VTI:       0.199 m  AORTA Ao Root diam: 2.90 cm Ao Asc diam:  3.00 cm MITRAL VALVE                TRICUSPID VALVE MV Area (PHT): 4.78 cm     TR Peak grad:   19.5 mmHg MR Peak grad: 22.5 mmHg     TR Vmax:        221.00 cm/s MR Vmax:      237.00 cm/s MV E velocity: 162.00 cm/s  SHUNTS                             Systemic VTI:  0.20 m                             Systemic Diam: 2.00 cm Arvilla Meres MD Electronically signed by Arvilla Meres MD Signature Date/Time: 12/10/2022/4:22:12 PM    Final    CT CHEST WO  CONTRAST  Result Date: 12/10/2022 CLINICAL DATA:  Pneumonia, complication suspected, xray done EXAM: CT CHEST WITHOUT CONTRAST TECHNIQUE: Multidetector CT imaging of the chest was performed following the standard protocol without IV contrast. RADIATION DOSE REDUCTION: This exam was performed  according to the departmental dose-optimization program which includes automated exposure control, adjustment of the mA and/or kV according to patient size and/or use of iterative reconstruction technique. COMPARISON:  Chest x-ray 12/09/2022 FINDINGS: Cardiovascular: Heart is normal size. Aorta is normal caliber. Aortic atherosclerosis. Mediastinum/Nodes: No mediastinal, hilar, or axillary adenopathy. Trachea and esophagus are unremarkable. Left thyroid nodule measuring 3 cm. In the setting of significant comorbidities or limited life expectancy, no follow-up recommended (ref: J Am Coll Radiol. 2015 Feb;12(2): 143-50). Lungs/Pleura: Lungs are clear. No focal airspace opacities or suspicious nodules. No effusions. The density seen on prior chest x-ray likely reflected the right breast implant superimposed over the right lower lobe. No effusions. Upper Abdomen: No acute findings Musculoskeletal: Bilateral breast implants. Chest wall soft tissues are unremarkable. No acute bony abnormality. IMPRESSION: No confluent airspace opacities. No acute cardiopulmonary disease. Aortic Atherosclerosis (ICD10-I70.0). Electronically Signed   By: Charlett Nose M.D.   On: 12/10/2022 00:52   DG Chest Portable 1 View  Result Date: 12/09/2022 CLINICAL DATA:  Weakness for the past 5 days.  AFib.  Dementia. EXAM: PORTABLE CHEST 1 VIEW COMPARISON:  None Available. FINDINGS: Normal heart size. Aortic atherosclerotic calcification. Hazy opacity in the right lower lung. No pleural effusion or pneumothorax. IMPRESSION: Hazy opacity in the right lower lung suspicious for pneumonia. Electronically Signed   By: Minerva Fester M.D.   On: 12/09/2022 16:15     Patient Profile     89 y.o. female  with a hx of hypertension, chronic anxiety/depression, breast cancer x 2 status post bilateral mastectomy who is being seen today for the evaluation of new onset atrial fibrillation at the request of Marlin Canary DO.      Assessment & Plan    New onset atrial fibrillation -Unknown duration of A-fib patient >> has had a cough for several months and has had weakness for a week.  Diagnosed with possible pneumonia which may be the trigger of patient's A-fib -Started on IV Cardizem drip with good heart rate control this subsequently was stopped and transition to Toprol -CHADS2VASC score 4 -Has been started on apixaban 5 mg twice daily. -She was on Carvedilol 25 mg twice daily at home was transitioned to Toprol XL 50 mg daily for better heart rate control -Echo showed normal EF 60 to 65% with moderate LVH moderate biatrial enlargement trivial MR   Hypertension -BP controlled at 114/44 mmHg -She was on carvedilol 25 mg twice daily, lisinopril 40 mg daily at home -Carvedilol was changed to Toprol XL 50 mg daily for better heart rate control and lisinopril was stopped to allow more blood pressure room for titration of rate controlling drugs  AKI -Creatinine 1.2 on admission now up to 1.41 -ACE inhibitor stopped yesterday  CHA2DS2-VASc Score = 4   This indicates a 4.8% annual risk of stroke. The patient's score is based upon: CHF History: 0 HTN History: 1 Diabetes History: 0 Stroke History: 0 Vascular Disease History: 0 Age Score: 2 Gender Score: 1         For questions or updates, please contact [facility] Please consult [email or web address] for contact info under        Signed, Armanda Magic, MD  12/11/2022, 7:58 AM

## 2022-12-11 NOTE — TOC Transition Note (Signed)
Transition of Care [facility]) - CM/SW Discharge Note   Patient Details  Name: Dawn Roy MRN: [id number] Date of Birth: 1929-04-03  [facility] CM/SW Contact:  Gala Lewandowsky, RN Phone Number: 12/11/2022, 11:59 AM   Clinical Narrative:  Patient transitioned home today. Case Manager discussed HH agency and family chose  [facility]. Office to call family within 24-48 hours post transition home. No further needs identified at this time.   Final next level of care: Home w Home Health Services Barriers to Discharge: No Barriers Identified  Discharge Plan and Services Additional resources added to the After Visit Summary for     Discharge Planning Services: CM Consult Post Acute Care Choice: Home Health            DME Agency: NA       HH Arranged: PT HH Agency: [facility] Date [facility] Agency Contacted: 12/11/22 Time HH Agency Contacted: 1030 Representative spoke with at [facility] Agency: Haywood Lasso  Social Determinants of Health (SDOH) Interventions SDOH Screenings   Food Insecurity: No Food Insecurity (12/10/2022)  Housing: Low Risk  (12/10/2022)  Transportation Needs: No Transportation Needs (12/10/2022)  Utilities: Not At Risk (12/10/2022)  Tobacco Use: Low Risk  (12/10/2022)   Readmission Risk Interventions     No data to display

## 2022-12-14 LAB — CULTURE, BLOOD (ROUTINE X 2)
Culture: NO GROWTH
Culture: NO GROWTH
Special Requests: ADEQUATE
Special Requests: ADEQUATE

## 2022-12-28 ENCOUNTER — Ambulatory Visit (HOSPITAL_COMMUNITY)
Admit: 2022-12-28 | Discharge: 2022-12-28 | Disposition: A | Discharge status: Home / Self Care | Payer: Medicare Other | Source: Ambulatory Visit | Attending: Internal Medicine | Admitting: Internal Medicine

## 2022-12-28 VITALS — BP 112/64 | HR 86 | Ht 63.0 in | Wt 193.4 lb

## 2022-12-28 DIAGNOSIS — R9431 Abnormal electrocardiogram [ECG] [EKG]: Secondary | ICD-10-CM | POA: Insufficient documentation

## 2022-12-28 DIAGNOSIS — I4819 Other persistent atrial fibrillation: Secondary | ICD-10-CM | POA: Insufficient documentation

## 2022-12-28 DIAGNOSIS — I480 Paroxysmal atrial fibrillation: Secondary | ICD-10-CM | POA: Diagnosis present

## 2022-12-28 DIAGNOSIS — I4891 Unspecified atrial fibrillation: Secondary | ICD-10-CM

## 2022-12-28 DIAGNOSIS — I10 Essential (primary) hypertension: Secondary | ICD-10-CM | POA: Diagnosis present

## 2022-12-28 DIAGNOSIS — Z853 Personal history of malignant neoplasm of breast: Secondary | ICD-10-CM | POA: Diagnosis present

## 2022-12-28 DIAGNOSIS — D6869 Other thrombophilia: Secondary | ICD-10-CM | POA: Diagnosis not present

## 2022-12-28 DIAGNOSIS — Z7901 Long term (current) use of anticoagulants: Secondary | ICD-10-CM | POA: Insufficient documentation

## 2022-12-28 LAB — BASIC METABOLIC PANEL
Anion gap: 4 — ABNORMAL LOW (ref 5–15)
BUN: 16 mg/dL (ref 8–23)
CO2: 31 mmol/L (ref 22–32)
Calcium: 8.4 mg/dL — ABNORMAL LOW (ref 8.9–10.3)
Chloride: 100 mmol/L (ref 98–111)
Creatinine, Ser: 1.14 mg/dL — ABNORMAL HIGH (ref 0.44–1.00)
GFR, Estimated: 45 mL/min — ABNORMAL LOW (ref >60)
Glucose, Bld: 91 mg/dL (ref 70–99)
Potassium: 4.5 mmol/L (ref 3.5–5.1)
Sodium: 135 mmol/L (ref 135–145)

## 2022-12-28 MED ORDER — APIXABAN 5 MG PO TABS: 5.0000 mg | ORAL_TABLET | Freq: Two times a day (BID) | ORAL | 3 refills | Status: AC

## 2022-12-28 MED ORDER — METOPROLOL SUCCINATE ER 50 MG PO TB24: 50.0000 mg | ORAL_TABLET | Freq: Every day | ORAL | 1 refills | Status: DC

## 2022-12-28 NOTE — Progress Notes (Signed)
Primary Care Physician: Lorenda Ishihara, MD Primary Cardiologist: None Electrophysiologist: None     Referring Physician: Dr. Phyllis Ginger is a 89 y.o. female with a history of HTN, history of breast cancer, and paroxysmal atrial fibrillation who presents for consultation in the [facility]. Recent admission 10/2-4/24 for pneumonia with new onset Afib with RVR; transitioned to Toprol 50 mg daily. Patient is on Eliquis 5 mg BID for a CHADS2VASC score of 4.  On evaluation today, she is currently in Afib. She does not have cardiac awareness of Afib. She denies any leg swelling or SOB. She is taking Eliquis but missed two doses last week by accident.   Today, she denies symptoms of palpitations, chest pain, orthopnea, PND, dizziness, presyncope, syncope, snoring, daytime somnolence, bleeding, or neurologic sequela. The patient is tolerating medications without difficulties and is otherwise without complaint today.    she has a BMI of Body mass index is 34.26 kg/m.Marland Kitchen Filed Weights   12/28/22 1356  Weight: 87.7 kg    Current Outpatient Medications  Medication Sig Dispense Refill   cholecalciferol (VITAMIN D) 1000 UNITS tablet Take 1,000 Units by mouth daily.     citalopram (CELEXA) 40 MG tablet Take 20 mg by mouth daily.     diclofenac Sodium (VOLTAREN) 1 % GEL as directed Transdermal Four times a day as needed to knee     furosemide (LASIX) 20 MG tablet Take 20 mg by mouth daily as needed (for stasis detrmatitis).     gabapentin (NEURONTIN) 100 MG capsule Take 100 mg by mouth every evening.     guaiFENesin (MUCINEX) 600 MG 12 hr tablet Take 1 tablet (600 mg total) by mouth 2 (two) times daily as needed for cough or to loosen phlegm. (Patient taking differently: Take 600 mg by mouth 2 (two) times daily.)     loratadine (CLARITIN) 10 MG tablet Take 10 mg by mouth daily.     Probiotic Product (PROBIOTIC DAILY PO) Take 1 capsule by mouth  daily. Culturelle     apixaban (ELIQUIS) 5 MG TABS tablet Take 1 tablet (5 mg total) by mouth 2 (two) times daily. 60 tablet 3   metoprolol succinate (TOPROL-XL) 50 MG 24 hr tablet Take 1 tablet (50 mg total) by mouth daily. Take with or immediately following a meal. 90 tablet 1   No current facility-administered medications for this encounter.    Atrial Fibrillation Management history:  Previous antiarrhythmic drugs: none Previous cardioversions: none Previous ablations: none Anticoagulation history: Eliquis 5 mg BID   ROS- All systems are reviewed and negative except as per the HPI above.  Physical Exam: BP 112/64   Pulse 86   Ht 5\' 3"  (1.6 m)   Wt 87.7 kg   BMI 34.26 kg/m   GEN: Well nourished, well developed in no acute distress NECK: No JVD; No carotid bruits CARDIAC: Irregularly irregular rate and rhythm, no murmurs, rubs, gallops RESPIRATORY:  Clear to auscultation without rales, wheezing or rhonchi  ABDOMEN: Soft, non-tender, non-distended EXTREMITIES:  No edema; No deformity   EKG today demonstrates  Vent. rate 86 BPM PR interval * ms QRS duration 72 ms QT/QTcB 382/457 ms P-R-T axes * 47 42 Atrial fibrillation Abnormal ECG When compared with ECG of 09-Dec-2022 16:00, PREVIOUS ECG IS PRESENT  Echo 12/10/22 demonstrated   1. Left ventricular ejection fraction, by estimation, is 60 to 65%. The  left ventricle has normal function. The left ventricle has no  regional  wall motion abnormalities. There is moderate concentric left ventricular  hypertrophy. Left ventricular  diastolic function could not be evaluated.   2. Right ventricular systolic function is normal. The right ventricular  size is normal.   3. Left atrial size was moderately dilated.   4. Right atrial size was moderately dilated.   5. The mitral valve is degenerative. Trivial mitral valve regurgitation.  No evidence of mitral stenosis. Moderate mitral annular calcification.   6. The aortic valve  is tricuspid. There is mild calcification of the  aortic valve. Aortic valve regurgitation is trivial. Aortic valve  sclerosis/calcification is present, without any evidence of aortic  stenosis.   7. The inferior vena cava is dilated in size with <50% respiratory  variability, suggesting right atrial pressure of 15 mmHg.    ASSESSMENT & PLAN CHA2DS2-VASc Score = 4  The patient's score is based upon: CHF History: 0 HTN History: 1 Diabetes History: 0 Stroke History: 0 Vascular Disease History: 0 Age Score: 2 Gender Score: 1       ASSESSMENT AND PLAN: Persistent Atrial Fibrillation (ICD10:  I48.19) The patient's CHA2DS2-VASc score is 4, indicating a 4.8% annual risk of stroke.    She is currently in Afib.  We discussed options for treatment including: rate control, cardioversion, cardioversion plus AAD, and the fact more than one cardioversion may be needed. We also discussed that most likely candidate for AAD would be amiodarone. It could possibly be Multaq but price check would be required. I answered patient and daughter questions.   Secondary Hypercoagulable State (ICD10:  D68.69) The patient is at significant risk for stroke/thromboembolism based upon her CHA2DS2-VASc Score of 4.  Continue Apixaban (Eliquis).  Continue without interruption.    Patient will call us with decision.    Lake Bells, PA-C  Afib Clinic [facility] [address] [place], [place] [address] [phone]

## 2023-03-18 ENCOUNTER — Other Ambulatory Visit: Payer: Self-pay | Admitting: Internal Medicine

## 2023-03-18 DIAGNOSIS — N63 Unspecified lump in unspecified breast: Secondary | ICD-10-CM

## 2023-03-25 ENCOUNTER — Other Ambulatory Visit: Payer: Self-pay | Admitting: Internal Medicine

## 2023-03-25 DIAGNOSIS — N63 Unspecified lump in unspecified breast: Secondary | ICD-10-CM

## 2023-03-26 ENCOUNTER — Other Ambulatory Visit (HOSPITAL_COMMUNITY): Payer: Self-pay | Admitting: Internal Medicine

## 2023-03-26 DIAGNOSIS — N63 Unspecified lump in unspecified breast: Secondary | ICD-10-CM

## 2023-03-28 ENCOUNTER — Other Ambulatory Visit: Payer: Medicare Other

## 2023-04-02 ENCOUNTER — Ambulatory Visit (HOSPITAL_COMMUNITY)
Admission: RE | Admit: 2023-04-02 | Discharge: 2023-04-02 | Disposition: A | Discharge status: Home / Self Care | Payer: Medicare Other | Source: Ambulatory Visit | Attending: Internal Medicine | Admitting: Internal Medicine

## 2023-04-02 ENCOUNTER — Encounter (HOSPITAL_COMMUNITY): Payer: Self-pay

## 2023-04-02 DIAGNOSIS — N63 Unspecified lump in unspecified breast: Secondary | ICD-10-CM

## 2023-04-07 ENCOUNTER — Encounter (HOSPITAL_BASED_OUTPATIENT_CLINIC_OR_DEPARTMENT_OTHER): Payer: Self-pay | Admitting: Emergency Medicine

## 2023-04-07 ENCOUNTER — Other Ambulatory Visit: Payer: Self-pay

## 2023-04-07 ENCOUNTER — Inpatient Hospital Stay (HOSPITAL_BASED_OUTPATIENT_CLINIC_OR_DEPARTMENT_OTHER)
Admission: EM | Admit: 2023-04-07 | Discharge: 2023-04-16 | DRG: 600 | Disposition: A | Discharge status: Home / Self Care | Payer: Medicare Other | Attending: Student | Admitting: Student

## 2023-04-07 ENCOUNTER — Emergency Department (HOSPITAL_BASED_OUTPATIENT_CLINIC_OR_DEPARTMENT_OTHER): Payer: Medicare Other

## 2023-04-07 ENCOUNTER — Telehealth: Payer: Self-pay | Admitting: Plastic Surgery

## 2023-04-07 DIAGNOSIS — Z9049 Acquired absence of other specified parts of digestive tract: Secondary | ICD-10-CM

## 2023-04-07 DIAGNOSIS — Z888 Allergy status to other drugs, medicaments and biological substances status: Secondary | ICD-10-CM

## 2023-04-07 DIAGNOSIS — Z885 Allergy status to narcotic agent status: Secondary | ICD-10-CM

## 2023-04-07 DIAGNOSIS — Z853 Personal history of malignant neoplasm of breast: Secondary | ICD-10-CM

## 2023-04-07 DIAGNOSIS — N1831 Chronic kidney disease, stage 3a: Secondary | ICD-10-CM | POA: Diagnosis present

## 2023-04-07 DIAGNOSIS — D631 Anemia in chronic kidney disease: Secondary | ICD-10-CM | POA: Diagnosis present

## 2023-04-07 DIAGNOSIS — R799 Abnormal finding of blood chemistry, unspecified: Secondary | ICD-10-CM

## 2023-04-07 DIAGNOSIS — T859XXA Unspecified complication of internal prosthetic device, implant and graft, initial encounter: Secondary | ICD-10-CM

## 2023-04-07 DIAGNOSIS — F418 Other specified anxiety disorders: Secondary | ICD-10-CM | POA: Insufficient documentation

## 2023-04-07 DIAGNOSIS — Z85828 Personal history of other malignant neoplasm of skin: Secondary | ICD-10-CM

## 2023-04-07 DIAGNOSIS — T8579XA Infection and inflammatory reaction due to other internal prosthetic devices, implants and grafts, initial encounter: Principal | ICD-10-CM | POA: Diagnosis present

## 2023-04-07 DIAGNOSIS — Z9013 Acquired absence of bilateral breasts and nipples: Secondary | ICD-10-CM

## 2023-04-07 DIAGNOSIS — Z9882 Breast implant status: Secondary | ICD-10-CM

## 2023-04-07 DIAGNOSIS — Z880 Allergy status to penicillin: Secondary | ICD-10-CM

## 2023-04-07 DIAGNOSIS — Z79899 Other long term (current) drug therapy: Secondary | ICD-10-CM

## 2023-04-07 DIAGNOSIS — I129 Hypertensive chronic kidney disease with stage 1 through stage 4 chronic kidney disease, or unspecified chronic kidney disease: Secondary | ICD-10-CM | POA: Diagnosis present

## 2023-04-07 DIAGNOSIS — M810 Age-related osteoporosis without current pathological fracture: Secondary | ICD-10-CM | POA: Diagnosis present

## 2023-04-07 DIAGNOSIS — Z881 Allergy status to other antibiotic agents status: Secondary | ICD-10-CM

## 2023-04-07 DIAGNOSIS — Z6836 Body mass index (BMI) 36.0-36.9, adult: Secondary | ICD-10-CM

## 2023-04-07 DIAGNOSIS — Z91041 Radiographic dye allergy status: Secondary | ICD-10-CM

## 2023-04-07 DIAGNOSIS — Z20822 Contact with and (suspected) exposure to covid-19: Secondary | ICD-10-CM

## 2023-04-07 DIAGNOSIS — Z66 Do not resuscitate: Secondary | ICD-10-CM | POA: Diagnosis present

## 2023-04-07 DIAGNOSIS — Z9221 Personal history of antineoplastic chemotherapy: Secondary | ICD-10-CM

## 2023-04-07 DIAGNOSIS — E538 Deficiency of other specified B group vitamins: Secondary | ICD-10-CM

## 2023-04-07 DIAGNOSIS — E669 Obesity, unspecified: Secondary | ICD-10-CM | POA: Diagnosis present

## 2023-04-07 DIAGNOSIS — Z90710 Acquired absence of both cervix and uterus: Secondary | ICD-10-CM

## 2023-04-07 DIAGNOSIS — Y831 Surgical operation with implant of artificial internal device as the cause of abnormal reaction of the patient, or of later complication, without mention of misadventure at the time of the procedure: Secondary | ICD-10-CM | POA: Diagnosis present

## 2023-04-07 DIAGNOSIS — L98499 Non-pressure chronic ulcer of skin of other sites with unspecified severity: Secondary | ICD-10-CM

## 2023-04-07 DIAGNOSIS — I872 Venous insufficiency (chronic) (peripheral): Secondary | ICD-10-CM | POA: Diagnosis present

## 2023-04-07 DIAGNOSIS — N61 Mastitis without abscess: Secondary | ICD-10-CM | POA: Diagnosis not present

## 2023-04-07 DIAGNOSIS — I10 Essential (primary) hypertension: Secondary | ICD-10-CM | POA: Diagnosis present

## 2023-04-07 DIAGNOSIS — L308 Other specified dermatitis: Secondary | ICD-10-CM | POA: Insufficient documentation

## 2023-04-07 DIAGNOSIS — F32A Depression, unspecified: Secondary | ICD-10-CM | POA: Diagnosis present

## 2023-04-07 DIAGNOSIS — L03313 Cellulitis of chest wall: Principal | ICD-10-CM | POA: Diagnosis present

## 2023-04-07 DIAGNOSIS — Z7901 Long term (current) use of anticoagulants: Secondary | ICD-10-CM

## 2023-04-07 DIAGNOSIS — I4819 Other persistent atrial fibrillation: Secondary | ICD-10-CM | POA: Diagnosis present

## 2023-04-07 DIAGNOSIS — L0390 Cellulitis, unspecified: Secondary | ICD-10-CM | POA: Diagnosis present

## 2023-04-07 DIAGNOSIS — Z883 Allergy status to other anti-infective agents status: Secondary | ICD-10-CM

## 2023-04-07 LAB — CBC WITH DIFFERENTIAL/PLATELET
Abs Immature Granulocytes: 0.05 10*3/uL (ref 0.00–0.07)
Basophils Absolute: 0.1 10*3/uL (ref 0.0–0.1)
Basophils Relative: 1 %
Eosinophils Absolute: 0.4 10*3/uL (ref 0.0–0.5)
Eosinophils Relative: 4 %
HCT: 33.6 % — ABNORMAL LOW (ref 36.0–46.0)
Hemoglobin: 10.7 g/dL — ABNORMAL LOW (ref 12.0–15.0)
Immature Granulocytes: 1 %
Lymphocytes Relative: 17 %
Lymphs Abs: 1.4 10*3/uL (ref 0.7–4.0)
MCH: 30.1 pg (ref 26.0–34.0)
MCHC: 31.8 g/dL (ref 30.0–36.0)
MCV: 94.4 fL (ref 80.0–100.0)
Monocytes Absolute: 0.8 10*3/uL (ref 0.1–1.0)
Monocytes Relative: 10 %
Neutro Abs: 5.9 10*3/uL (ref 1.7–7.7)
Neutrophils Relative %: 67 %
Platelets: 369 10*3/uL (ref 150–400)
RBC: 3.56 MIL/uL — ABNORMAL LOW (ref 3.87–5.11)
RDW: 13.2 % (ref 11.5–15.5)
WBC: 8.6 10*3/uL (ref 4.0–10.5)
nRBC: 0 % (ref 0.0–0.2)

## 2023-04-07 LAB — RESP PANEL BY RT-PCR (RSV, FLU A&B, COVID)  RVPGX2
Influenza A by PCR: NEGATIVE
Influenza B by PCR: NEGATIVE
Resp Syncytial Virus by PCR: NEGATIVE
SARS Coronavirus 2 by RT PCR: NEGATIVE

## 2023-04-07 LAB — COMPREHENSIVE METABOLIC PANEL
ALT: 32 U/L (ref 0–44)
AST: 39 U/L (ref 15–41)
Albumin: 3 g/dL — ABNORMAL LOW (ref 3.5–5.0)
Alkaline Phosphatase: 73 U/L (ref 38–126)
Anion gap: 5 (ref 5–15)
BUN: 21 mg/dL (ref 8–23)
CO2: 33 mmol/L — ABNORMAL HIGH (ref 22–32)
Calcium: 8.8 mg/dL — ABNORMAL LOW (ref 8.9–10.3)
Chloride: 99 mmol/L (ref 98–111)
Creatinine, Ser: 1.25 mg/dL — ABNORMAL HIGH (ref 0.44–1.00)
GFR, Estimated: 41 mL/min — ABNORMAL LOW (ref >60)
Glucose, Bld: 104 mg/dL — ABNORMAL HIGH (ref 70–99)
Potassium: 4.4 mmol/L (ref 3.5–5.1)
Sodium: 137 mmol/L (ref 135–145)
Total Bilirubin: 0.6 mg/dL (ref 0.0–1.2)
Total Protein: 6.7 g/dL (ref 6.5–8.1)

## 2023-04-07 LAB — LACTIC ACID, PLASMA: Lactic Acid, Venous: 0.9 mmol/L (ref 0.5–1.9)

## 2023-04-07 MED ORDER — LACTATED RINGERS IV BOLUS
500.0000 mL | Freq: Once | INTRAVENOUS | Status: AC
  Administered 2023-04-07: 500 mL via INTRAVENOUS

## 2023-04-07 MED ORDER — MEROPENEM 1 G IV SOLR
1.0000 g | Freq: Once | INTRAVENOUS | Status: AC
  Administered 2023-04-07: 1 g via INTRAVENOUS

## 2023-04-07 NOTE — ED Provider Notes (Addendum)
[place] EMERGENCY DEPARTMENT AT [facility] Provider Note   CSN: [id number] Arrival date & time: 04/07/23  1211     History  Chief Complaint  Patient presents with   Breast Pain    Dawn Roy is a 89 y.o. female.  HPI   89 year old female with medical history significant for hypertension, depression, breast cancer status postchemotherapy and double mastectomy with breast implants in place who presents to the emergency department with ulcerations on her breasts.  The patient has had roughly 2 months of ulcers on her breast bilaterally, worse on the right.  She initially saw her PCP regarding this and was referred to an outpatient plastic surgeon however the plastic surgeon recommended an MRI of the breast prior to any immediate intervention.  The patient was unable to tolerate MRI and her PCP sent her to the emergency department as her symptoms were worsening.  The patient has multiple ulcerations along her chest wall that are draining serous fluid, has had no fevers or chills but has had worsening redness along the chest wall and breast on the right.  Home Medications Prior to Admission medications   Medication Sig Start Date End Date Taking? Authorizing Provider  apixaban (ELIQUIS) 5 MG TABS tablet Take 1 tablet (5 mg total) by mouth 2 (two) times daily. 12/28/22   Eustace Pen, PA-C  cholecalciferol (VITAMIN D) 1000 UNITS tablet Take 1,000 Units by mouth daily.    [provider]  citalopram (CELEXA) 40 MG tablet Take 20 mg by mouth daily.    [provider]  diclofenac Sodium (VOLTAREN) 1 % GEL as directed Transdermal Four times a day as needed to knee 05/23/18   [provider]  furosemide (LASIX) 20 MG tablet Take 20 mg by mouth daily as needed (for stasis detrmatitis).    [provider]  gabapentin (NEURONTIN) 100 MG capsule Take 100 mg by mouth every evening.    [provider]  guaiFENesin (MUCINEX) 600 MG 12  hr tablet Take 1 tablet (600 mg total) by mouth 2 (two) times daily as needed for cough or to loosen phlegm. Patient taking differently: Take 600 mg by mouth 2 (two) times daily. 12/11/22   Joseph Art, DO  loratadine (CLARITIN) 10 MG tablet Take 10 mg by mouth daily.    [provider]  metoprolol succinate (TOPROL-XL) 50 MG 24 hr tablet Take 1 tablet (50 mg total) by mouth daily. Take with or immediately following a meal. 12/28/22   Eustace Pen, PA-C  Probiotic Product (PROBIOTIC DAILY PO) Take 1 capsule by mouth daily. Culturelle    [provider]      Allergies    Azithromycin, Dalmane [flurazepam], Demerol [meperidine], Felodipine, Fosamax [alendronate sodium], Hctz [hydrochlorothiazide], Ivp dye [iodinated contrast media], Keflin [cephalothin], Macrodantin [nitrofurantoin macrocrystal], Morphine and codeine, Phenergan [promethazine hcl], Valium [diazepam], Doxycycline, Keflex [cephalexin], Neosporin [neomycin-bacitracin zn-polymyx], and Penicillins    Review of Systems   Review of Systems  All other systems reviewed and are negative.   Physical Exam Updated Vital Signs BP 110/71 (BP Location: Left Arm)   Pulse 83   Temp 98 F (36.7 C) (Oral)   Resp 18   Ht 5\' 2"  (1.575 m)   Wt 90.7 kg   SpO2 94%   BMI 36.58 kg/m  Physical Exam Vitals and nursing note reviewed.  Constitutional:      General: She is not in acute distress.    Appearance: She is well-developed.  HENT:  Head: Normocephalic and atraumatic.  Eyes:     Conjunctiva/sclera: Conjunctivae normal.  Cardiovascular:     Rate and Rhythm: Normal rate and regular rhythm.  Pulmonary:     Effort: Pulmonary effort is normal. No respiratory distress.     Breath sounds: Normal breath sounds.  Chest:     Comments: Chest wall with erythema, mild tenderness multiple ulcerations present along the right breast, no exposed implant noted, no active bleeding, single ulceration noted along the left chest  wall with mild erythema. Abdominal:     Palpations: Abdomen is soft.     Tenderness: There is no abdominal tenderness.  Musculoskeletal:        General: No swelling.     Cervical back: Neck supple.  Skin:    General: Skin is warm and dry.     Capillary Refill: Capillary refill takes less than 2 seconds.  Neurological:     Mental Status: She is alert.  Psychiatric:        Mood and Affect: Mood normal.       ED Results / Procedures / Treatments   Labs (all labs ordered are listed, but only abnormal results are displayed) Labs Reviewed  CBC WITH DIFFERENTIAL/PLATELET - Abnormal; Notable for the following components:      Result Value   RBC 3.56 (*)    Hemoglobin 10.7 (*)    HCT 33.6 (*)    All other components within normal limits  COMPREHENSIVE METABOLIC PANEL - Abnormal; Notable for the following components:   CO2 33 (*)    Glucose, Bld 104 (*)    Creatinine, Ser 1.25 (*)    Calcium 8.8 (*)    Albumin 3.0 (*)    GFR, Estimated 41 (*)    All other components within normal limits  RESP PANEL BY RT-PCR (RSV, FLU A&B, COVID)  [id number]  CULTURE, BLOOD (ROUTINE X 2)  CULTURE, BLOOD (ROUTINE X 2)  LACTIC ACID, PLASMA  LACTIC ACID, PLASMA    EKG None  Radiology No results found.  Procedures Procedures    Medications Ordered in ED Medications  meropenem (MERREM) 1 g in sodium chloride 0.9 % 100 mL IVPB (0 g Intravenous Stopped 04/07/23 1452)  lactated ringers bolus 500 mL (500 mLs Intravenous New Bag/Given 04/07/23 1453)    ED Course/ Medical Decision Making/ A&P Clinical Course as of 04/07/23 1516  Wed Apr 07, 2023  1459 Assumed care from Dr Karene Fry. 89 yo F with hx of mastectomy and breast implantation who presented with 2 months of slow skin breakdown and ulceration of her breasts R>L. Saw PCP who referred to plastics who wouldn't see her until the MRI. Awaiting callback from plastic surgery regarding imaging. Has lots of allergies. Started on meropenem.  [RP]     Clinical Course User Index [RP] Rondel Baton, MD                                 Medical Decision Making Amount and/or Complexity of Data Reviewed Labs: ordered. Radiology: ordered.  Risk Decision regarding hospitalization.    89 year old female with medical history significant for hypertension, depression, breast cancer status postchemotherapy and double mastectomy with breast implants in place who presents to the emergency department with ulcerations on her breasts.  The patient has had roughly 2 months of ulcers on her breast bilaterally, worse on the right.  She initially saw her PCP regarding this and was referred to  an outpatient plastic surgeon however the plastic surgeon recommended an MRI of the breast prior to any immediate intervention.  The patient was unable to tolerate MRI and her PCP sent her to the emergency department as her symptoms were worsening.  The patient has multiple ulcerations along her chest wall that are draining serous fluid, has had no fevers or chills but has had worsening redness along the chest wall and breast on the right.  On arrival, the patient was afebrile, not tachycardic or tachypneic, hemodynamically stable, BP 110/71, saturating 94% on room air.  Physical exam as per above with a chest wall with erythema, mild tenderness with multiple ulcerations present along the right chest wall and right breast.  Concern for chest wall cellulitis, possible pressure ulcerations, unclear if abscess present.  Patient not meeting SIRS criteria on arrival.  IV access was obtained, screening labs were obtained and the patient was administered a 500 cc IV fluid bolus.  CBC was without a leukocytosis, mild stable anemia to 10.7, CMP without significant electrolyte abnormalities, serum creatinine at 1.25, at the patient's baseline, COVID-19, influenza, RSV PCR testing was collected and negative, lactic acid was normal at 0.9, blood cultures x 2 were collected and pending  prior to antibiotic administration.  On chart review, the patient has multiple allergies to cephalosporins, has required aztreonam in the past for IV antibiotics.  Most recently had a MRSA PCR screen on 10/324 which was negative.  I initially consulted on-call general surgery who directed me to on-call plastic surgery given the patient's breast implants.  I spoke with Dr. Weyman Croon of on-call plastic surgery who recommended admission to [facility] to the medicine service, IV antibiotics, will see the patient in consultation in the hospital.  CT imaging of the chest without contrast was obtained given the patient's history of contrast allergy.  CT Chest WO: Pending  Hospitalist medicine was consulted for admission.  Patient and family updated bedside regarding the ultimate plan of care.   Final Clinical Impression(s) / ED Diagnoses Final diagnoses:  Skin ulcer, unspecified ulcer stage (HCC)  Cellulitis of chest wall  Complication of breast implant    Rx / DC Orders ED Discharge Orders     None         Ernie Avena, MD 04/07/23 1516    Ernie Avena, MD 04/07/23 1517

## 2023-04-07 NOTE — ED Notes (Signed)
Pt assisted to bedside commode to urinate.  Helped back into bed and monitoring cords in place and warm blanket given with call bell in reach.

## 2023-04-07 NOTE — Progress Notes (Signed)
ED Pharmacy Antibiotic Sign Off An antibiotic consult was received from an ED provider for meropenem per pharmacy dosing for cellulitis. A chart review was completed to assess appropriateness.   The following one time order(s) were placed:  Meropenem 1g IV x 1  Further antibiotic and/or antibiotic pharmacy consults should be ordered by the admitting provider if indicated.   Thank you for allowing pharmacy to be a part of this patient's care.   Mackinley Cassaday, Drake Leach, [facility]  Clinical Pharmacist 04/07/23 2:03 PM

## 2023-04-07 NOTE — Progress Notes (Addendum)
Dr. Eloise Harman gave report:  89 yo breast ca. Double mastectomy and implanted.   Now : 2 months right greater than left wound erythema on breasts. Concern for cellulitis. Plastic surgery engaged (they will consult at [facility]). Iv abx meropenem started in ER. Vitally stable. Lab non actionable.  A/p - wil accept to [facility] only. Full H&P and eval to be done after patient arrive here. Intend to recall plastic surgery at that time. Please call Garett. L Green PAC on arrival to [facility]. Plan for ?Will likely try to take her to the OR for implant removal w/ washout and biopsies tomorrow afternoon or Friday afternoon ? ??NPO after midnight.     Media Information  Document Information   Media Information  Document Information   Labs on Admission:  Results for orders placed or performed during the hospital encounter of 04/07/23 (from the past 24 hours)  CBC with Differential     Status: Abnormal   Collection Time: 04/07/23  1:37 PM  Result Value Ref Range   WBC 8.6 4.0 - 10.5 K/uL   RBC 3.56 (L) 3.87 - 5.11 MIL/uL   Hemoglobin 10.7 (L) 12.0 - 15.0 g/dL   HCT 62.1 (L) 30.8 - 65.7 %   MCV 94.4 80.0 - 100.0 fL   MCH 30.1 26.0 - 34.0 pg   MCHC 31.8 30.0 - 36.0 g/dL   RDW 84.6 96.2 - 95.2 %   Platelets 369 150 - 400 K/uL   nRBC 0.0 0.0 - 0.2 %   Neutrophils Relative % 67 %   Neutro Abs 5.9 1.7 - 7.7 K/uL   Lymphocytes Relative 17 %   Lymphs Abs 1.4 0.7 - 4.0 K/uL   Monocytes Relative 10 %   Monocytes Absolute 0.8 0.1 - 1.0 K/uL   Eosinophils Relative 4 %   Eosinophils Absolute 0.4 0.0 - 0.5 K/uL   Basophils Relative 1 %   Basophils Absolute 0.1 0.0 - 0.1 K/uL   Immature Granulocytes 1 %   Abs Immature Granulocytes 0.05 0.00 - 0.07 K/uL  Comprehensive metabolic panel     Status: Abnormal   Collection Time: 04/07/23  1:37 PM  Result Value Ref Range   Sodium 137 135 - 145 mmol/L   Potassium 4.4 3.5 - 5.1 mmol/L   Chloride 99 98 - 111 mmol/L   CO2 33 (H) 22 - 32 mmol/L   Glucose, Bld 104  (H) 70 - 99 mg/dL   BUN 21 8 - 23 mg/dL   Creatinine, Ser 8.41 (H) 0.44 - 1.00 mg/dL   Calcium 8.8 (L) 8.9 - 10.3 mg/dL   Total Protein 6.7 6.5 - 8.1 g/dL   Albumin 3.0 (L) 3.5 - 5.0 g/dL   AST 39 15 - 41 U/L   ALT 32 0 - 44 U/L   Alkaline Phosphatase 73 38 - 126 U/L   Total Bilirubin 0.6 0.0 - 1.2 mg/dL   GFR, Estimated 41 (L) >60 mL/min   Anion gap 5 5 - 15  Lactic acid, plasma     Status: None   Collection Time: 04/07/23  1:37 PM  Result Value Ref Range   Lactic Acid, Venous 0.9 0.5 - 1.9 mmol/L  Resp panel by RT-PCR (RSV, Flu A&B, Covid) Anterior Nasal Swab     Status: None   Collection Time: 04/07/23  1:37 PM   Specimen: Anterior Nasal Swab  Result Value Ref Range   SARS Coronavirus 2 by RT PCR NEGATIVE NEGATIVE   Influenza A by PCR NEGATIVE  NEGATIVE   Influenza B by PCR NEGATIVE NEGATIVE   Resp Syncytial Virus by PCR NEGATIVE NEGATIVE   Basic Metabolic Panel: Recent Labs  Lab 04/07/23 1337  NA 137  K 4.4  CL 99  CO2 33*  GLUCOSE 104*  BUN 21  CREATININE 1.25*  CALCIUM 8.8*   Liver Function Tests: Recent Labs  Lab 04/07/23 1337  AST 39  ALT 32  ALKPHOS 73  BILITOT 0.6  PROT 6.7  ALBUMIN 3.0*   No results for input(s): "LIPASE", "AMYLASE" in the last 168 hours. No results for input(s): "AMMONIA" in the last 168 hours. CBC: Recent Labs  Lab 04/07/23 1337  WBC 8.6  NEUTROABS 5.9  HGB 10.7*  HCT 33.6*  MCV 94.4  PLT 369   Cardiac Enzymes: No results for input(s): "CKTOTAL", "CKMB", "CKMBINDEX", "TROPONINIHS" in the last 168 hours.  BNP (last 3 results) No results for input(s): "PROBNP" in the last 8760 hours. CBG: No results for input(s): "GLUCAP" in the last 168 hours.  Radiological Exams on Admission:   No intake/output data recorded. Total I/O In: 100 [IV Piggyback:100] Out: -

## 2023-04-07 NOTE — ED Notes (Signed)
Report given to the next RN.Marland KitchenMarland Kitchen

## 2023-04-07 NOTE — ED Provider Notes (Signed)
  Physical Exam  BP 110/71 (BP Location: Left Arm)   Pulse 83   Temp 98 F (36.7 C) (Oral)   Resp 18   Ht 5\' 2"  (1.575 m)   Wt 90.7 kg   SpO2 94%   BMI 36.58 kg/m   Physical Exam  Procedures  Procedures  ED Course / MDM   Clinical Course as of 04/07/23 1559  Wed Apr 07, 2023  1459 Assumed care from Dr Karene Fry. 89 yo F with hx of breast cancer sp mastectomy and breast implantation who presented with 2 months of slow skin breakdown and ulceration of her breasts R>L. Saw PCP who referred to plastics who wouldn't see her until the MRI. Awaiting callback from plastic surgery regarding imaging. Has lots of allergies. Started on meropenem.  [RP]  1519 Dr Karene Fry discussed with plastic surgery who will see the patient when admitted. Awaiting CT scan.  [RP]  1558 Discussed with hospitalist Dr. Maryjean Ka who will admit the patient to [facility].  CT scan still pending at this time. [RP]    Clinical Course User Index [RP] Rondel Baton, MD   Medical Decision Making Amount and/or Complexity of Data Reviewed Labs: ordered. Radiology: ordered.  Risk Decision regarding hospitalization.      Rondel Baton, MD 04/07/23 [phone]

## 2023-04-07 NOTE — ED Triage Notes (Signed)
Pt via pov from home; states that her breasts have "ulcers" on them. Daughter reports that pt has implants and there is concern that they are leaking. Pt could not tolerate MRI on breasts; she is supposed to see surgeon, but plastic surgeon requires MRI before seeing her. Pt is apparently picking at the spots on her breasts and there is redness and fluid coming from the sores. Pt alert & oriented, nad noted.

## 2023-04-08 DIAGNOSIS — Z9013 Acquired absence of bilateral breasts and nipples: Secondary | ICD-10-CM

## 2023-04-08 DIAGNOSIS — L98499 Non-pressure chronic ulcer of skin of other sites with unspecified severity: Secondary | ICD-10-CM

## 2023-04-08 DIAGNOSIS — N61 Mastitis without abscess: Secondary | ICD-10-CM

## 2023-04-08 DIAGNOSIS — S21002A Unspecified open wound of left breast, initial encounter: Secondary | ICD-10-CM

## 2023-04-08 DIAGNOSIS — Z9049 Acquired absence of other specified parts of digestive tract: Secondary | ICD-10-CM | POA: Diagnosis not present

## 2023-04-08 DIAGNOSIS — Z9221 Personal history of antineoplastic chemotherapy: Secondary | ICD-10-CM | POA: Diagnosis not present

## 2023-04-08 DIAGNOSIS — I129 Hypertensive chronic kidney disease with stage 1 through stage 4 chronic kidney disease, or unspecified chronic kidney disease: Secondary | ICD-10-CM | POA: Diagnosis present

## 2023-04-08 DIAGNOSIS — Z888 Allergy status to other drugs, medicaments and biological substances status: Secondary | ICD-10-CM | POA: Diagnosis not present

## 2023-04-08 DIAGNOSIS — Z9882 Breast implant status: Secondary | ICD-10-CM

## 2023-04-08 DIAGNOSIS — N1831 Chronic kidney disease, stage 3a: Secondary | ICD-10-CM | POA: Diagnosis present

## 2023-04-08 DIAGNOSIS — Z85828 Personal history of other malignant neoplasm of skin: Secondary | ICD-10-CM | POA: Diagnosis not present

## 2023-04-08 DIAGNOSIS — Z883 Allergy status to other anti-infective agents status: Secondary | ICD-10-CM | POA: Diagnosis not present

## 2023-04-08 DIAGNOSIS — Z880 Allergy status to penicillin: Secondary | ICD-10-CM | POA: Diagnosis not present

## 2023-04-08 DIAGNOSIS — D631 Anemia in chronic kidney disease: Secondary | ICD-10-CM | POA: Diagnosis present

## 2023-04-08 DIAGNOSIS — Z79899 Other long term (current) drug therapy: Secondary | ICD-10-CM | POA: Diagnosis not present

## 2023-04-08 DIAGNOSIS — F32A Depression, unspecified: Secondary | ICD-10-CM | POA: Diagnosis present

## 2023-04-08 DIAGNOSIS — R799 Abnormal finding of blood chemistry, unspecified: Secondary | ICD-10-CM | POA: Diagnosis not present

## 2023-04-08 DIAGNOSIS — Z66 Do not resuscitate: Secondary | ICD-10-CM | POA: Diagnosis present

## 2023-04-08 DIAGNOSIS — Z90710 Acquired absence of both cervix and uterus: Secondary | ICD-10-CM | POA: Diagnosis not present

## 2023-04-08 DIAGNOSIS — Z20822 Contact with and (suspected) exposure to covid-19: Secondary | ICD-10-CM | POA: Diagnosis present

## 2023-04-08 DIAGNOSIS — L03313 Cellulitis of chest wall: Secondary | ICD-10-CM | POA: Diagnosis present

## 2023-04-08 DIAGNOSIS — Z853 Personal history of malignant neoplasm of breast: Secondary | ICD-10-CM | POA: Diagnosis not present

## 2023-04-08 DIAGNOSIS — Z881 Allergy status to other antibiotic agents status: Secondary | ICD-10-CM | POA: Diagnosis not present

## 2023-04-08 DIAGNOSIS — E538 Deficiency of other specified B group vitamins: Secondary | ICD-10-CM | POA: Diagnosis not present

## 2023-04-08 DIAGNOSIS — I4819 Other persistent atrial fibrillation: Secondary | ICD-10-CM | POA: Diagnosis present

## 2023-04-08 DIAGNOSIS — Z7901 Long term (current) use of anticoagulants: Secondary | ICD-10-CM | POA: Diagnosis not present

## 2023-04-08 DIAGNOSIS — S21001A Unspecified open wound of right breast, initial encounter: Secondary | ICD-10-CM

## 2023-04-08 DIAGNOSIS — L0390 Cellulitis, unspecified: Secondary | ICD-10-CM | POA: Diagnosis not present

## 2023-04-08 DIAGNOSIS — E669 Obesity, unspecified: Secondary | ICD-10-CM | POA: Diagnosis present

## 2023-04-08 DIAGNOSIS — Z885 Allergy status to narcotic agent status: Secondary | ICD-10-CM | POA: Diagnosis not present

## 2023-04-08 LAB — CBC WITH DIFFERENTIAL/PLATELET
Abs Immature Granulocytes: 0.05 10*3/uL (ref 0.00–0.07)
Basophils Absolute: 0.1 10*3/uL (ref 0.0–0.1)
Basophils Relative: 1 %
Eosinophils Absolute: 0.4 10*3/uL (ref 0.0–0.5)
Eosinophils Relative: 5 %
HCT: 33.6 % — ABNORMAL LOW (ref 36.0–46.0)
Hemoglobin: 10.6 g/dL — ABNORMAL LOW (ref 12.0–15.0)
Immature Granulocytes: 1 %
Lymphocytes Relative: 19 %
Lymphs Abs: 1.6 10*3/uL (ref 0.7–4.0)
MCH: 30.1 pg (ref 26.0–34.0)
MCHC: 31.5 g/dL (ref 30.0–36.0)
MCV: 95.5 fL (ref 80.0–100.0)
Monocytes Absolute: 1 10*3/uL (ref 0.1–1.0)
Monocytes Relative: 12 %
Neutro Abs: 5.3 10*3/uL (ref 1.7–7.7)
Neutrophils Relative %: 62 %
Platelets: 340 10*3/uL (ref 150–400)
RBC: 3.52 MIL/uL — ABNORMAL LOW (ref 3.87–5.11)
RDW: 13.2 % (ref 11.5–15.5)
WBC: 8.4 10*3/uL (ref 4.0–10.5)
nRBC: 0 % (ref 0.0–0.2)

## 2023-04-08 LAB — BASIC METABOLIC PANEL
Anion gap: 8 (ref 5–15)
BUN: 19 mg/dL (ref 8–23)
CO2: 26 mmol/L (ref 22–32)
Calcium: 8.6 mg/dL — ABNORMAL LOW (ref 8.9–10.3)
Chloride: 103 mmol/L (ref 98–111)
Creatinine, Ser: 1.19 mg/dL — ABNORMAL HIGH (ref 0.44–1.00)
GFR, Estimated: 43 mL/min — ABNORMAL LOW (ref >60)
Glucose, Bld: 85 mg/dL (ref 70–99)
Potassium: 4.4 mmol/L (ref 3.5–5.1)
Sodium: 137 mmol/L (ref 135–145)

## 2023-04-08 LAB — HEMOGLOBIN A1C
Hgb A1c MFr Bld: 5.2 % (ref 4.8–5.6)
Mean Plasma Glucose: 102.54 mg/dL

## 2023-04-08 MED ORDER — GABAPENTIN 100 MG PO CAPS
100.0000 mg | ORAL_CAPSULE | Freq: Every evening | ORAL | Status: DC
Start: 2023-04-08 — End: 2023-04-16
  Administered 2023-04-08 – 2023-04-15 (×8): 100 mg via ORAL
  Filled 2023-04-08 (×8): qty 1

## 2023-04-08 MED ORDER — FUROSEMIDE 20 MG PO TABS: 20.0000 mg | ORAL_TABLET | Freq: Every day | ORAL | Status: DC | PRN

## 2023-04-08 MED ORDER — ACETAMINOPHEN 325 MG PO TABS: 650.0000 mg | ORAL_TABLET | Freq: Four times a day (QID) | ORAL | Status: DC | PRN

## 2023-04-08 MED ORDER — ACETAMINOPHEN 650 MG RE SUPP
650.0000 mg | Freq: Four times a day (QID) | RECTAL | Status: DC | PRN
Start: 2023-04-08 — End: 2023-04-16

## 2023-04-08 MED ORDER — SODIUM CHLORIDE 0.9 % IV SOLN
1.0000 g | Freq: Two times a day (BID) | INTRAVENOUS | Status: DC
  Administered 2023-04-08 – 2023-04-12 (×10): 1 g via INTRAVENOUS
  Filled 2023-04-08 (×11): qty 20

## 2023-04-08 MED ORDER — CITALOPRAM HYDROBROMIDE 20 MG PO TABS
20.0000 mg | ORAL_TABLET | Freq: Every day | ORAL | Status: DC
  Administered 2023-04-08 – 2023-04-16 (×9): 20 mg via ORAL
  Filled 2023-04-08 (×9): qty 1

## 2023-04-08 MED ORDER — ENOXAPARIN SODIUM 40 MG/0.4ML IJ SOSY
40.0000 mg | PREFILLED_SYRINGE | Freq: Once | INTRAMUSCULAR | Status: AC
  Administered 2023-04-08: 40 mg via SUBCUTANEOUS
  Filled 2023-04-08: qty 0.4

## 2023-04-08 MED ORDER — APIXABAN 5 MG PO TABS
5.0000 mg | ORAL_TABLET | Freq: Two times a day (BID) | ORAL | Status: DC
  Administered 2023-04-08 – 2023-04-16 (×16): 5 mg via ORAL
  Filled 2023-04-08 (×16): qty 1

## 2023-04-08 MED ORDER — METOPROLOL SUCCINATE ER 25 MG PO TB24
50.0000 mg | ORAL_TABLET | Freq: Every day | ORAL | Status: DC
  Administered 2023-04-08 – 2023-04-14 (×7): 50 mg via ORAL
  Filled 2023-04-08: qty 2
  Filled 2023-04-08 (×7): qty 1

## 2023-04-08 NOTE — H&P (Addendum)
History and Physical    Patient: Dawn Roy WGN:[id number] DOB: 09-24-1929 DOA: 04/07/2023 DOS: the patient was seen and examined on 04/08/2023 PCP: Lorenda Ishihara, MD  Patient coming from: [facility]  Chief Complaint:  Chief Complaint  Patient presents with   Breast Pain   HPI: Dawn Roy is a 89 y.o. female with medical history significant of atrial fibrillation on Eliquis, hypertension, depression, CKD 3, breast cancer status postchemotherapy and double mastectomy with breast implants who presents to the ED with ulcerations on her breast for the past 2 months.  Patient and granddaughter at bedside, both assisted in providing history.  Patient reports that she had breast cancer diagnosed in 1968 for which she underwent chemotherapy.  She denies any history of radiation therapy.  She reports that she had a left mastectomy done in 1969 and right mastectomy done in 1977.  She subsequently underwent reconstructive surgery with bilateral gel implants in 1981.  Since that time, she has done very well and has not had any recurrence.  However, about a couple of months ago she began noticing wounds around her right breast.  She also noticed 1 wound on the side of her left breast that appeared several weeks ago.  Her primary care doctor ordered an MRI and referred her to outpatient plastic surgery.  However she was unable to obtain the MRI as she was unable to lay on her stomach.  Her PCP subsequently advised her to come to the ED for further evaluation.  She denies any fevers, chills, nausea, vomiting, chest pain, palpitations, shortness of breath, abdominal pain, diarrhea.  She denies any tenderness around her breast ulcerations.  ED course: Vital signs stable.  CBC without leukocytosis and with stable chronic anemia.  CMP with mild hyperglycemia and creatinine 1.25 (baseline creatinine appears to be around 1.1-1.2).  Lactic acid normal.  Blood cultures x 2 collected and pending.   CT chest with skin thickening along the breast bilaterally but no underlying abscess noted.  She was transferred to [facility] for further evaluation by plastic surgery.  [facility] hospitalist was asked to evaluate patient for admission.  Review of Systems: As mentioned in the history of present illness. All other systems reviewed and are negative. Past Medical History:  Diagnosis Date   Arthritis    Basal cell carcinoma    Breast cancer (HCC)    Depression    Hypertension    Osteoporosis    Vertigo    Past Surgical History:  Procedure Laterality Date   ABDOMINAL HYSTERECTOMY     BREAST RECONSTRUCTION     capsulotomy with vaginal laser     cataract surgery     CHOLECYSTECTOMY     COLONOSCOPY     DILATION AND CURETTAGE OF UTERUS     MASTECTOMY     TONSILLECTOMY     Social History:  reports that she has never smoked. She has never used smokeless tobacco. She reports that she does not drink alcohol and does not use drugs.  Allergies  Allergen Reactions   Azithromycin Hives   Dalmane [Flurazepam] Other (See Comments)    unknown   Demerol [Meperidine] Other (See Comments)    Unknown    Felodipine Other (See Comments)    unknown   Fosamax [Alendronate Sodium] Other (See Comments)    unknown   Hctz [Hydrochlorothiazide] Other (See Comments)    Unknown    Ivp Dye [Iodinated Contrast Media] Other (See Comments)    unknown   Keflin [Cephalothin]  Other (See Comments)    unknown   Macrodantin [Nitrofurantoin Macrocrystal] Other (See Comments)    unknown   Morphine And Codeine Other (See Comments)    unknown   Phenergan [Promethazine Hcl] Other (See Comments)    unknown   Valium [Diazepam] Other (See Comments)    unknown   Doxycycline Rash    headache   Keflex [Cephalexin] Rash   Neosporin [Neomycin-Bacitracin Zn-Polymyx] Rash   Penicillins Rash    History reviewed. No pertinent family history.  Prior to Admission medications   Medication Sig Start Date End Date Taking?  Authorizing Provider  apixaban (ELIQUIS) 5 MG TABS tablet Take 1 tablet (5 mg total) by mouth 2 (two) times daily. 12/28/22   Eustace Pen, PA-C  cholecalciferol (VITAMIN D) 1000 UNITS tablet Take 1,000 Units by mouth daily.    [provider]  citalopram (CELEXA) 40 MG tablet Take 20 mg by mouth daily.    [provider]  diclofenac Sodium (VOLTAREN) 1 % GEL as directed Transdermal Four times a day as needed to knee 05/23/18   [provider]  furosemide (LASIX) 20 MG tablet Take 20 mg by mouth daily as needed (for stasis detrmatitis).    [provider]  gabapentin (NEURONTIN) 100 MG capsule Take 100 mg by mouth every evening.    [provider]  guaiFENesin (MUCINEX) 600 MG 12 hr tablet Take 1 tablet (600 mg total) by mouth 2 (two) times daily as needed for cough or to loosen phlegm. Patient taking differently: Take 600 mg by mouth 2 (two) times daily. 12/11/22   Joseph Art, DO  loratadine (CLARITIN) 10 MG tablet Take 10 mg by mouth daily.    [provider]  metoprolol succinate (TOPROL-XL) 50 MG 24 hr tablet Take 1 tablet (50 mg total) by mouth daily. Take with or immediately following a meal. 12/28/22   Eustace Pen, PA-C  Probiotic Product (PROBIOTIC DAILY PO) Take 1 capsule by mouth daily. Culturelle    [provider]    Physical Exam: Vitals:   04/08/23 0430 04/08/23 0500 04/08/23 0600 04/08/23 0750  BP:  116/63 116/63 (!) 142/54  Pulse: 80 85 90 (!) 106  Resp:  16 18   Temp:   98.9 F (37.2 C) 97.9 F (36.6 C)  TempSrc:    Oral  SpO2: 97% 96% 97% 95%  Weight:      Height:       Physical Exam Constitutional:      Appearance: Normal appearance. She is obese. She is not ill-appearing.  HENT:     Head: Normocephalic and atraumatic.     Ears:     Comments: Hard of hearing    Mouth/Throat:     Mouth: Mucous membranes are moist.     Pharynx: Oropharynx is clear. No oropharyngeal exudate.  Eyes:      General: No scleral icterus.    Extraocular Movements: Extraocular movements intact.     Conjunctiva/sclera: Conjunctivae normal.     Pupils: Pupils are equal, round, and reactive to light.  Cardiovascular:     Rate and Rhythm: Normal rate and regular rhythm.     Pulses: Normal pulses.     Heart sounds: No murmur heard.    No friction rub. No gallop.  Pulmonary:     Effort: Pulmonary effort is normal.     Breath sounds: Normal breath sounds. No wheezing, rhonchi or rales.  Abdominal:     General: Bowel sounds are normal.  There is no distension.     Palpations: Abdomen is soft.     Tenderness: There is no abdominal tenderness. There is no guarding or rebound.  Musculoskeletal:        General: Normal range of motion.  Skin:    General: Skin is warm and dry.     Comments: Examined alongside plastic surgery.    Right breast is erythematous throughout with scattered ulcerations.  Skin is mildly indurated with no palpable underlying fluid collections.  No obvious visible implant.  No TTP.  Left breast with a single lateral ulceration but no overlying erythema. No TTP.  Neurological:     General: No focal deficit present.     Mental Status: She is alert and oriented to person, place, and time.  Psychiatric:        Mood and Affect: Mood normal.        Behavior: Behavior normal.         Data Reviewed:  There are no new results to review at this time.     Latest Ref Rng & Units 04/08/2023    8:30 AM 04/07/2023    1:37 PM 12/28/2022    1:57 PM  CMP  Glucose 70 - 99 mg/dL 85  409  91   BUN 8 - 23 mg/dL 19  21  16    Creatinine 0.44 - 1.00 mg/dL 8.11  9.14  7.82   Sodium 135 - 145 mmol/L 137  137  135   Potassium 3.5 - 5.1 mmol/L 4.4  4.4  4.5   Chloride 98 - 111 mmol/L 103  99  100   CO2 22 - 32 mmol/L 26  33  31   Calcium 8.9 - 10.3 mg/dL 8.6  8.8  8.4   Total Protein 6.5 - 8.1 g/dL  6.7    Total Bilirubin 0.0 - 1.2 mg/dL  0.6    Alkaline Phos 38 - 126 U/L  73    AST 15 -  41 U/L  39    ALT 0 - 44 U/L  32        Latest Ref Rng & Units 04/08/2023    8:30 AM 04/07/2023    1:37 PM 12/11/2022    5:43 AM  CBC  WBC 4.0 - 10.5 K/uL 8.4  8.6  8.3   Hemoglobin 12.0 - 15.0 g/dL 95.6  21.3  08.6   Hematocrit 36.0 - 46.0 % 33.6  33.6  33.8   Platelets 150 - 400 K/uL 340  369  241    Lactic Acid, Venous    Component Value Date/Time   LATICACIDVEN 0.9 04/07/2023 1337    Assessment and Plan: No notes have been filed under this hospital service. Service: Hospitalist  Bilateral breast ulcerations with overlying cellulitis Hx of breast cancer s/p chemotherapy, bilateral mastectomy with bilateral gel implant-based reconstruction Patient presenting with bilateral breast ulcerations with overlying cellulitis, right worse than left.  Plastic surgery has evaluated patient, suspect ruptured gel implants bilaterally and concern for possible extrusion given ulcerations and erythema to the right breast.  Fortunately CT imaging without abscess.  Plan is to take patient for right sided or bilateral implant removal with irrigation and biopsy in the OR tomorrow.  Patient denies any tenderness at wound sites.  She remains afebrile, without leukocytosis, and with normal lactate.  She is hemodynamically stable. -plastic surgery following, appreciate assistance -Right-sided or bilateral implant removal with irrigation and biopsy tomorrow -meropenem for empiric coverage (multiple antibiotic allergies listed in  chart) -Follow-up biopsy pathology tomorrow -Follow-up blood cultures x 2 -heart healthy diet, NPO at midnight -One-time dose of Lovenox for dvt ppx today, SCDs alone thereafter -Holding home Eliquis -Admit to inpatient/telemetry  Addendum: Discussed with plastic surgery again this afternoon.  Plan has changed to instead perform a bedside biopsy of ulcers to determine if there is any sign of recurrent cancer prior to attempting to remove the implants.  They have advised to resume  her anticoagulation and she will not need to be n.p.o. tonight for the biopsies tomorrow. -Resumed home Eliquis -Removed n.p.o. at midnight order -Plan for bedside biopsies tomorrow, follow-up pathologies  Atrial fibrillation -on eliquis at home, holding in anticipation for OR tomorrow -resume home metoprolol succinate 50mg  daily -She is currently rate controlled  CKD 3A/B Baseline creatinine appears to be around 1.1 to 1.2.  Baseline GFR appears to be at the borderline of CKD 3A and 3B.  On admission creatinine 1.25 with GFR 41.  Kidney function stable.  Chronic normocytic anemia Baseline hemoglobin appears to be around 10.5-11.  On admission, hemoglobin 10.6, at baseline.  She denies any melena, hematochezia. unclear if her chronic anemia is secondary to underlying chronic kidney disease.  Will check iron studies, folate, B12 while here. -Follow-up iron studies -Follow-up B12, folate  Chronic venous stasis dermatitis She has a history of chronic leg swelling.  She had an echocardiogram done in 12/2022 which showed LVEF 60 to 65%, normal LV function, no regional wall motion abnormalities, moderate LVH, normal RV systolic function, no significant valvular abnormalities.  She is on Lasix 20 mg daily for management of her chronic leg swelling. -Resume home Lasix 20 mg daily  HTN -resume home toprol-xl 50mg  daily  Depression -resume home citalopram 20mg  daily   Advance Care Planning:   Code Status: Limited: Do not attempt resuscitation (DNR) -DNR-LIMITED -Do Not Intubate/DNI    Consults: plastic surgery  Family Communication: updated family at bedside  Severity of Illness: The appropriate patient status for this patient is INPATIENT. Inpatient status is judged to be reasonable and necessary in order to provide the required intensity of service to ensure the patient's safety. The patient's presenting symptoms, physical exam findings, and initial radiographic and laboratory data in the  context of their chronic comorbidities is felt to place them at high risk for further clinical deterioration. Furthermore, it is not anticipated that the patient will be medically stable for discharge from the hospital within 2 midnights of admission.   * I certify that at the point of admission it is my clinical judgment that the patient will require inpatient hospital care spanning beyond 2 midnights from the point of admission due to high intensity of service, high risk for further deterioration and high frequency of surveillance required.*  Portions of this note were generated with Dragon dictation software. Dictation errors may occur despite best attempts at proofreading.   Author: Briscoe Burns, MD 04/08/2023 9:20 AM  For on call review [email or web address].

## 2023-04-08 NOTE — ED Notes (Signed)
Non stick dressing placed over right breast wound.

## 2023-04-08 NOTE — ED Notes (Signed)
Bed linens and gown changed d/t being wet, pt cleaned, repositioned, and call bell in place.

## 2023-04-08 NOTE — Progress Notes (Signed)
Pharmacy Antibiotic Note  Dawn Roy is a 89 y.o. female admitted on 04/07/2023 with  bilateral breast ulcerations . Hx breast cancer and implants. Pharmacy has been consulted for Meropenem dosing, hx multiple medication allergies.  Meropenem x 1 given 1/29.    Plan: Meropenem 1gm IV q12h. Follow renal function for any need to adjust regimen. Plastic surgery plans right-sided or bilateral implant removal on 04/09/23.  Height: 5\' 2"  (157.5 cm) Weight: 90.7 kg (200 lb) IBW/kg (Calculated) : 50.1  Temp (24hrs), Avg:98.1 F (36.7 C), Min:97.9 F (36.6 C), Max:98.9 F (37.2 C)  Recent Labs  Lab 04/07/23 1337 04/08/23 0830  WBC 8.6 8.4  CREATININE 1.25* 1.19*  LATICACIDVEN 0.9  --     Estimated Creatinine Clearance: 32.2 mL/min (A) (by C-G formula based on SCr of 1.19 mg/dL (H)).    Allergies  Allergen Reactions   Azithromycin Hives   Dalmane [Flurazepam] Other (See Comments)    unknown   Demerol [Meperidine] Other (See Comments)    Unknown    Felodipine Other (See Comments)    unknown   Fosamax [Alendronate Sodium] Other (See Comments)    unknown   Hctz [Hydrochlorothiazide] Other (See Comments)    Unknown    Ivp Dye [Iodinated Contrast Media] Other (See Comments)    unknown   Keflin [Cephalothin] Other (See Comments)    unknown   Macrodantin [Nitrofurantoin Macrocrystal] Other (See Comments)    unknown   Morphine And Codeine Other (See Comments)    unknown   Phenergan [Promethazine Hcl] Other (See Comments)    unknown   Valium [Diazepam] Other (See Comments)    unknown   Doxycycline Rash    headache   Keflex [Cephalexin] Rash   Neosporin [Neomycin-Bacitracin Zn-Polymyx] Rash   Penicillins Rash    Antimicrobials this admission: Meropenem 1/29 >>  Dose adjustments this admission: n/a  Microbiology results: 1/29 blood: no growth < 24 hours to date 1/29 COVID, flu and RSV: neg  Thank you for allowing pharmacy to be a part of this patient's  care.  Dennie Fetters. RPh 04/08/2023 9:31 AM

## 2023-04-08 NOTE — Plan of Care (Addendum)
Problem: Activity: Goal: Risk for activity intolerance will decrease Outcome: Progressing

## 2023-04-08 NOTE — Consult Note (Addendum)
Reason for Consult: Ulceration over bilateral reconstructed breasts Referring Physician: Dr. Faye Ramsay Dawn Roy is an 89 y.o. female.  HPI: Patient is a pleasant 89 year old female with history of bilateral mastectomy and gel implant-based reconstruction who was admitted to the hospital after presenting to the ED for ulcerations of her bilateral breasts, R > L.   Evidently this began a couple of months ago and PCP had ordered MRI prior to seeing plastic surgery outpatient.  After she was unable to tolerate the MRI, she presented to the ED.  Laboratory workup and vital signs all largely unremarkable.  Given overlying erythema right breast as well as skin thickening noted on CT, IV meropenem initiated.  On examination this morning, patient states that she had her left mastectomy in 1969, right mastectomy 1977, and bilateral gel implant placement 1981.  She reports that she has not had implant exchange since then.  Reports history of chemotherapy, but no radiation treatments.  Denies any discomfort.  She is accompanied by her granddaughter at bedside who reports that she has been "picking" at her ulcerations.    Past Medical History:  Diagnosis Date   Arthritis    Basal cell carcinoma    Breast cancer (HCC)    Depression    Hypertension    Osteoporosis    Vertigo     Past Surgical History:  Procedure Laterality Date   ABDOMINAL HYSTERECTOMY     BREAST RECONSTRUCTION     capsulotomy with vaginal laser     cataract surgery     CHOLECYSTECTOMY     COLONOSCOPY     DILATION AND CURETTAGE OF UTERUS     MASTECTOMY     TONSILLECTOMY      History reviewed. No pertinent family history.  Social History:  reports that she has never smoked. She has never used smokeless tobacco. She reports that she does not drink alcohol and does not use drugs.  Allergies:  Allergies  Allergen Reactions   Azithromycin Hives   Dalmane [Flurazepam] Other (See Comments)    unknown   Demerol  [Meperidine] Other (See Comments)    Unknown    Felodipine Other (See Comments)    unknown   Fosamax [Alendronate Sodium] Other (See Comments)    unknown   Hctz [Hydrochlorothiazide] Other (See Comments)    Unknown    Ivp Dye [Iodinated Contrast Media] Other (See Comments)    unknown   Keflin [Cephalothin] Other (See Comments)    unknown   Macrodantin [Nitrofurantoin Macrocrystal] Other (See Comments)    unknown   Morphine And Codeine Other (See Comments)    unknown   Phenergan [Promethazine Hcl] Other (See Comments)    unknown   Valium [Diazepam] Other (See Comments)    unknown   Doxycycline Rash    headache   Keflex [Cephalexin] Rash   Neosporin [Neomycin-Bacitracin Zn-Polymyx] Rash   Penicillins Rash    Medications: I have reviewed the patient's current medications.  Results for orders placed or performed during the hospital encounter of 04/07/23 (from the past 48 hours)  CBC with Differential     Status: Abnormal   Collection Time: 04/07/23  1:37 PM  Result Value Ref Range   WBC 8.6 4.0 - 10.5 K/uL   RBC 3.56 (L) 3.87 - 5.11 MIL/uL   Hemoglobin 10.7 (L) 12.0 - 15.0 g/dL   HCT 78.2 (L) 95.6 - 21.3 %   MCV 94.4 80.0 - 100.0 fL   MCH 30.1 26.0 - 34.0 pg  MCHC 31.8 30.0 - 36.0 g/dL   RDW 40.9 81.1 - 91.4 %   Platelets 369 150 - 400 K/uL   nRBC 0.0 0.0 - 0.2 %   Neutrophils Relative % 67 %   Neutro Abs 5.9 1.7 - 7.7 K/uL   Lymphocytes Relative 17 %   Lymphs Abs 1.4 0.7 - 4.0 K/uL   Monocytes Relative 10 %   Monocytes Absolute 0.8 0.1 - 1.0 K/uL   Eosinophils Relative 4 %   Eosinophils Absolute 0.4 0.0 - 0.5 K/uL   Basophils Relative 1 %   Basophils Absolute 0.1 0.0 - 0.1 K/uL   Immature Granulocytes 1 %   Abs Immature Granulocytes 0.05 0.00 - 0.07 K/uL    Comment: Performed at [facility], [address] [place], [place] [address]  Comprehensive metabolic panel     Status: Abnormal   Collection Time: 04/07/23  1:37 PM  Result Value Ref  Range   Sodium 137 135 - 145 mmol/L   Potassium 4.4 3.5 - 5.1 mmol/L   Chloride 99 98 - 111 mmol/L   CO2 33 (H) 22 - 32 mmol/L   Glucose, Bld 104 (H) 70 - 99 mg/dL    Comment: Glucose reference range applies only to samples taken after fasting for at least 8 hours.   BUN 21 8 - 23 mg/dL   Creatinine, Ser 6.21 (H) 0.44 - 1.00 mg/dL   Calcium 8.8 (L) 8.9 - 10.3 mg/dL   Total Protein 6.7 6.5 - 8.1 g/dL   Albumin 3.0 (L) 3.5 - 5.0 g/dL   AST 39 15 - 41 U/L   ALT 32 0 - 44 U/L   Alkaline Phosphatase 73 38 - 126 U/L   Total Bilirubin 0.6 0.0 - 1.2 mg/dL   GFR, Estimated 41 (L) >60 mL/min    Comment: (NOTE) Calculated using the CKD-EPI Creatinine Equation (2021)    Anion gap 5 5 - 15    Comment: Performed at [facility], [address], [place], [place] [address]  Lactic acid, plasma     Status: None   Collection Time: 04/07/23  1:37 PM  Result Value Ref Range   Lactic Acid, Venous 0.9 0.5 - 1.9 mmol/L    Comment: Performed at [facility], [address] [place], [place] [address]  Resp panel by RT-PCR (RSV, Flu A&B, Covid) Anterior Nasal Swab     Status: None   Collection Time: 04/07/23  1:37 PM   Specimen: Anterior Nasal Swab  Result Value Ref Range   SARS Coronavirus 2 by RT PCR NEGATIVE NEGATIVE    Comment: (NOTE) SARS-CoV-2 target nucleic acids are NOT DETECTED.  The SARS-CoV-2 RNA is generally detectable in upper respiratory specimens during the acute phase of infection. The lowest concentration of SARS-CoV-2 viral copies this assay can detect is 138 copies/mL. A negative result does not preclude SARS-Cov-2 infection and should not be used as the sole basis for treatment or other patient management decisions. A negative result may occur with  improper specimen collection/handling, submission of specimen other than nasopharyngeal swab, presence of viral mutation(s) within the areas targeted by this assay, and inadequate number of  viral copies(<138 copies/mL). A negative result must be combined with clinical observations, patient history, and epidemiological information. The expected result is Negative.  Fact Sheet for Patients:  BloggerCourse.com  Fact Sheet for Healthcare Providers:  SeriousBroker.it  This test is no t yet approved or cleared by the [place] FDA and  has been  authorized for detection and/or diagnosis of SARS-CoV-2 by FDA under an Emergency Use Authorization (EUA). This EUA will remain  in effect (meaning this test can be used) for the duration of the COVID-19 declaration under Section 564(b)(1) of the Act, 21 U.S.C.section 360bbb-3(b)(1), unless the authorization is terminated  or revoked sooner.       Influenza A by PCR NEGATIVE NEGATIVE   Influenza B by PCR NEGATIVE NEGATIVE    Comment: (NOTE) The Xpert Xpress SARS-CoV-2/FLU/RSV plus assay is intended as an aid in the diagnosis of influenza from Nasopharyngeal swab specimens and should not be used as a sole basis for treatment. Nasal washings and aspirates are unacceptable for Xpert Xpress SARS-CoV-2/FLU/RSV testing.  Fact Sheet for Patients: BloggerCourse.com  Fact Sheet for Healthcare Providers: SeriousBroker.it  This test is not yet approved or cleared by the [place] FDA and has been authorized for detection and/or diagnosis of SARS-CoV-2 by FDA under an Emergency Use Authorization (EUA). This EUA will remain in effect (meaning this test can be used) for the duration of the COVID-19 declaration under Section 564(b)(1) of the Act, 21 U.S.C. section 360bbb-3(b)(1), unless the authorization is terminated or revoked.     Resp Syncytial Virus by PCR NEGATIVE NEGATIVE    Comment: (NOTE) Fact Sheet for Patients: BloggerCourse.com  Fact Sheet for Healthcare  Providers: SeriousBroker.it  This test is not yet approved or cleared by the [place] FDA and has been authorized for detection and/or diagnosis of SARS-CoV-2 by FDA under an Emergency Use Authorization (EUA). This EUA will remain in effect (meaning this test can be used) for the duration of the COVID-19 declaration under Section 564(b)(1) of the Act, 21 U.S.C. section 360bbb-3(b)(1), unless the authorization is terminated or revoked.  Performed at [facility], [address], [place], [place] [address]     CT Chest Wo Contrast Result Date: 04/07/2023 CLINICAL DATA:  Chest wall cellulitis, possible abscess. Breast ulcers. EXAM: CT CHEST WITHOUT CONTRAST TECHNIQUE: Multidetector CT imaging of the chest was performed following the standard protocol without IV contrast. RADIATION DOSE REDUCTION: This exam was performed according to the departmental dose-optimization program which includes automated exposure control, adjustment of the mA and/or kV according to patient size and/or use of iterative reconstruction technique. COMPARISON:  12/10/2022. FINDINGS: Cardiovascular: Atherosclerotic calcification of the aorta and aortic valve. Enlarged pulmonic trunk and heart. No pericardial effusion. Mediastinum/Nodes: 2.9 cm low-attenuation left thyroid nodule. Given advanced patient age, no follow-up recommended unless clinically warranted. (Ref: J Am Coll Radiol. 2015 Feb;12(2): 143-50). No pathologically enlarged mediastinal or axillary lymph nodes. Hilar regions are difficult to definitively evaluate without IV contrast. Internally heterogeneous breast implants, as on 12/10/2022, indicative of intracapsular rupture. There is skin thickening along both breasts. No underlying fluid collection to indicate an abscess. Lungs/Pleura: Mild septal thickening. 5 mm ground-glass right lower lobe nodule, unchanged. Per Fleischner Society guidelines, no follow-up is  necessary. No pleural fluid. Airway is unremarkable. Upper Abdomen: Mild intrahepatic biliary ductal dilatation, as before. Cholecystectomy. Tiny hiatal hernia. Visualized portions of the liver, adrenal glands, kidneys, spleen, pancreas, stomach and bowel are otherwise grossly unremarkable. No upper abdominal adenopathy. Musculoskeletal: Degenerative changes in the spine. IMPRESSION: 1. Skin thickening along the breasts bilaterally without underlying abscess. 2. Mild pulmonary parenchymal septal thickening is suggestive of pulmonary edema. 3. Aortic atherosclerosis (ICD10-I70.0). 4. Enlarged pulmonic trunk, indicative of pulmonary arterial hypertension. Electronically Signed   By: Leanna Battles M.D.   On: 04/07/2023 17:13    ROS Skin: Endorses ulceration  with mild associated itching General: Denies pain or fevers   Blood pressure (!) 142/54, pulse (!) 106, temperature 97.9 F (36.6 C), temperature source Oral, resp. rate 18, height 5\' 2"  (1.575 m), weight 90.7 kg, SpO2 95%. Physical Exam Constitutional:      General: She is not in acute distress.    Appearance: She is not toxic-appearing.  Pulmonary:     Effort: Pulmonary effort is normal.  Skin:    Comments: Right breast: Erythematous throughout with scattered ulcerations.  No obvious visible implant.  Skin mildly indurated.  No palpable underlying fluid collections.  Suspected ruptured gel implant based on firmness. Left breast: Single ulceration out laterally, but no overlying erythema or other skin changes.  Suspected rupture gel implant based on firmness.  Neurological:     Mental Status: She is alert.  Psychiatric:        Mood and Affect: Mood normal.        Behavior: Behavior normal.     Assessment/Plan:  Ulcerations to bilateral reconstructed breasts:  Suspect that patient has ruptured gel implants bilaterally and that her ulcerations and erythema to the right breast is concerning for possible extrusion.  Personally reviewed  CT which shows particularly thin flap/coverage of implant on the right side, particularly medially.  Discussed case with Dr. Ladona Ridgel who also reviewed imaging and media in chart.  -Plan for right-sided or bilateral implant removal with irrigation and biopsy in the operating room tomorrow. -Request to hold patient's Eliquis.  Lovenox/SCDs for DVT prophylaxis per primary team. -N.p.o. after midnight and hold tomorrow morning's Lovenox. -Continue IV antibiotics per primary team.  Patient and granddaughter seemed agreeable to the idea of surgery, but patient wants to first speak with her daughter before moving forward.  Will follow-up with patient later today to confirm plan.  Picture(s) obtained of the patient and placed in the chart were with the patient's or guardian's permission.   Evelena Leyden 04/08/2023, 8:49 AM   Agree with Mr Chilton Si.  As noted above Dawn Roy is a 89 year old female with a long history of breast cancer and reconstruction who over the past 2 months has developed erythema and ulceration of the right breast and now has ulceration on the left breast as well.  I had a long discussion with Dawn Roy and her daughter this afternoon.  The erythema has not significantly changed over the past month.  The ulcerations have gotten worse.  At no time has Dawn Roy appeared septic or ill.  She currently is afebrile with a normal white count.  CT scan does not show any fluid collection around the implants and while I agree that the implants probably are ruptured they are contained within the capsule.  The implant capsule is clearly adjacent to the skin but is unclear how long that has been the case.  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  I likewise share her concern that her mother may not be medically able to tolerate a large surgery.  Given the chronicity of the issue and lack of systemic symptoms I would prefer to do biopsies of  the skin at the bedside to determine if there is any sign of recurrent cancer prior to attempting to remove the implants.  Dawn Roy and her daughter are both agreeable to this.  I will obtain biopsies of both breasts over the ulcerations tomorrow in the early afternoon.  And will move forward based on the results.  Dawn Roy  may continue on her anticoagulation and does not need to be n.p.o. tonight for the biopsies.

## 2023-04-08 NOTE — ED Notes (Signed)
ED TO INPATIENT HANDOFF REPORT  ED Nurse Name and Phone #: Radonna Ricker Name/Age/Gender Dawn Roy 89 y.o. female Room/Bed: DB013/DB013  Code Status   Code Status: Prior  Home/SNF/Other Home Patient oriented to: self, place, time, and situation Is this baseline? Yes   Triage Complete: Triage complete  Chief Complaint Cellulitis [L03.90]  Triage Note Pt via pov from home; states that her breasts have "ulcers" on them. Daughter reports that pt has implants and there is concern that they are leaking. Pt could not tolerate MRI on breasts; she is supposed to see surgeon, but plastic surgeon requires MRI before seeing her. Pt is apparently picking at the spots on her breasts and there is redness and fluid coming from the sores. Pt alert & oriented, nad noted.    Allergies Allergies  Allergen Reactions   Azithromycin Hives   Dalmane [Flurazepam] Other (See Comments)    unknown   Demerol [Meperidine] Other (See Comments)    Unknown    Felodipine Other (See Comments)    unknown   Fosamax [Alendronate Sodium] Other (See Comments)    unknown   Hctz [Hydrochlorothiazide] Other (See Comments)    Unknown    Ivp Dye [Iodinated Contrast Media] Other (See Comments)    unknown   Keflin [Cephalothin] Other (See Comments)    unknown   Macrodantin [Nitrofurantoin Macrocrystal] Other (See Comments)    unknown   Morphine And Codeine Other (See Comments)    unknown   Phenergan [Promethazine Hcl] Other (See Comments)    unknown   Valium [Diazepam] Other (See Comments)    unknown   Doxycycline Rash    headache   Keflex [Cephalexin] Rash   Neosporin [Neomycin-Bacitracin Zn-Polymyx] Rash   Penicillins Rash    Level of Care/Admitting Diagnosis ED Disposition     ED Disposition  Admit   Condition  --   Comment  Hospital Area: [facility] [[id number]]  Level of Care: Med-Surg [16]  May admit patient to [facility] or [facility] if equivalent level of care  is available:: No  Interfacility transfer: Yes  Covid Evaluation: Asymptomatic - no recent exposure (last 10 days) testing not required  Diagnosis: Cellulitis [161096]  Admitting Physician: Nolberto Hanlon [[id number]]  Attending Physician: Nolberto Hanlon [[id number]]  Certification:: I certify this patient will need inpatient services for at least 2 midnights  Expected Medical Readiness: 04/09/2023          B Medical/Surgery History Past Medical History:  Diagnosis Date   Arthritis    Basal cell carcinoma    Breast cancer (HCC)    Depression    Hypertension    Osteoporosis    Vertigo    Past Surgical History:  Procedure Laterality Date   ABDOMINAL HYSTERECTOMY     BREAST RECONSTRUCTION     capsulotomy with vaginal laser     cataract surgery     CHOLECYSTECTOMY     COLONOSCOPY     DILATION AND CURETTAGE OF UTERUS     MASTECTOMY     TONSILLECTOMY       A IV Location/Drains/Wounds Patient Lines/Drains/Airways Status     Active Line/Drains/Airways     Name Placement date Placement time Site Days   Peripheral IV 04/07/23 20 G Anterior;Proximal;Right Forearm 04/07/23  1414  Forearm  1            Intake/Output Last 24 hours  Intake/Output Summary (Last 24 hours) at 04/08/2023 0220 Last data filed at 04/07/2023 1452 Gross  per 24 hour  Intake 100 ml  Output --  Net 100 ml    Labs/Imaging Results for orders placed or performed during the hospital encounter of 04/07/23 (from the past 48 hours)  CBC with Differential     Status: Abnormal   Collection Time: 04/07/23  1:37 PM  Result Value Ref Range   WBC 8.6 4.0 - 10.5 K/uL   RBC 3.56 (L) 3.87 - 5.11 MIL/uL   Hemoglobin 10.7 (L) 12.0 - 15.0 g/dL   HCT 16.1 (L) 09.6 - 04.5 %   MCV 94.4 80.0 - 100.0 fL   MCH 30.1 26.0 - 34.0 pg   MCHC 31.8 30.0 - 36.0 g/dL   RDW 40.9 81.1 - 91.4 %   Platelets 369 150 - 400 K/uL   nRBC 0.0 0.0 - 0.2 %   Neutrophils Relative % 67 %   Neutro Abs 5.9 1.7 - 7.7 K/uL   Lymphocytes  Relative 17 %   Lymphs Abs 1.4 0.7 - 4.0 K/uL   Monocytes Relative 10 %   Monocytes Absolute 0.8 0.1 - 1.0 K/uL   Eosinophils Relative 4 %   Eosinophils Absolute 0.4 0.0 - 0.5 K/uL   Basophils Relative 1 %   Basophils Absolute 0.1 0.0 - 0.1 K/uL   Immature Granulocytes 1 %   Abs Immature Granulocytes 0.05 0.00 - 0.07 K/uL    Comment: Performed at [facility], [address], [place], [place] [address]  Comprehensive metabolic panel     Status: Abnormal   Collection Time: 04/07/23  1:37 PM  Result Value Ref Range   Sodium 137 135 - 145 mmol/L   Potassium 4.4 3.5 - 5.1 mmol/L   Chloride 99 98 - 111 mmol/L   CO2 33 (H) 22 - 32 mmol/L   Glucose, Bld 104 (H) 70 - 99 mg/dL    Comment: Glucose reference range applies only to samples taken after fasting for at least 8 hours.   BUN 21 8 - 23 mg/dL   Creatinine, Ser 6.21 (H) 0.44 - 1.00 mg/dL   Calcium 8.8 (L) 8.9 - 10.3 mg/dL   Total Protein 6.7 6.5 - 8.1 g/dL   Albumin 3.0 (L) 3.5 - 5.0 g/dL   AST 39 15 - 41 U/L   ALT 32 0 - 44 U/L   Alkaline Phosphatase 73 38 - 126 U/L   Total Bilirubin 0.6 0.0 - 1.2 mg/dL   GFR, Estimated 41 (L) >60 mL/min    Comment: (NOTE) Calculated using the CKD-EPI Creatinine Equation (2021)    Anion gap 5 5 - 15    Comment: Performed at [facility], [address], [place], [place] [address]  Lactic acid, plasma     Status: None   Collection Time: 04/07/23  1:37 PM  Result Value Ref Range   Lactic Acid, Venous 0.9 0.5 - 1.9 mmol/L    Comment: Performed at [facility], [address], [place], [place] [address]  Resp panel by RT-PCR (RSV, Flu A&B, Covid) Anterior Nasal Swab     Status: None   Collection Time: 04/07/23  1:37 PM   Specimen: Anterior Nasal Swab  Result Value Ref Range   SARS Coronavirus 2 by RT PCR NEGATIVE NEGATIVE    Comment: (NOTE) SARS-CoV-2 target nucleic acids are NOT DETECTED.  The SARS-CoV-2 RNA is generally detectable  in upper respiratory specimens during the acute phase of infection. The lowest concentration of SARS-CoV-2 viral copies this assay can detect is 138 copies/mL. A  negative result does not preclude SARS-Cov-2 infection and should not be used as the sole basis for treatment or other patient management decisions. A negative result may occur with  improper specimen collection/handling, submission of specimen other than nasopharyngeal swab, presence of viral mutation(s) within the areas targeted by this assay, and inadequate number of viral copies(<138 copies/mL). A negative result must be combined with clinical observations, patient history, and epidemiological information. The expected result is Negative.  Fact Sheet for Patients:  BloggerCourse.com  Fact Sheet for Healthcare Providers:  SeriousBroker.it  This test is no t yet approved or cleared by the [place] FDA and  has been authorized for detection and/or diagnosis of SARS-CoV-2 by FDA under an Emergency Use Authorization (EUA). This EUA will remain  in effect (meaning this test can be used) for the duration of the COVID-19 declaration under Section 564(b)(1) of the Act, 21 U.S.C.section 360bbb-3(b)(1), unless the authorization is terminated  or revoked sooner.       Influenza A by PCR NEGATIVE NEGATIVE   Influenza B by PCR NEGATIVE NEGATIVE    Comment: (NOTE) The Xpert Xpress SARS-CoV-2/FLU/RSV plus assay is intended as an aid in the diagnosis of influenza from Nasopharyngeal swab specimens and should not be used as a sole basis for treatment. Nasal washings and aspirates are unacceptable for Xpert Xpress SARS-CoV-2/FLU/RSV testing.  Fact Sheet for Patients: BloggerCourse.com  Fact Sheet for Healthcare Providers: SeriousBroker.it  This test is not yet approved or cleared by the [place] FDA and has been  authorized for detection and/or diagnosis of SARS-CoV-2 by FDA under an Emergency Use Authorization (EUA). This EUA will remain in effect (meaning this test can be used) for the duration of the COVID-19 declaration under Section 564(b)(1) of the Act, 21 U.S.C. section 360bbb-3(b)(1), unless the authorization is terminated or revoked.     Resp Syncytial Virus by PCR NEGATIVE NEGATIVE    Comment: (NOTE) Fact Sheet for Patients: BloggerCourse.com  Fact Sheet for Healthcare Providers: SeriousBroker.it  This test is not yet approved or cleared by the [place] FDA and has been authorized for detection and/or diagnosis of SARS-CoV-2 by FDA under an Emergency Use Authorization (EUA). This EUA will remain in effect (meaning this test can be used) for the duration of the COVID-19 declaration under Section 564(b)(1) of the Act, 21 U.S.C. section 360bbb-3(b)(1), unless the authorization is terminated or revoked.  Performed at [facility], [address] [place], [place] [address]    CT Chest Wo Contrast Result Date: 04/07/2023 CLINICAL DATA:  Chest wall cellulitis, possible abscess. Breast ulcers. EXAM: CT CHEST WITHOUT CONTRAST TECHNIQUE: Multidetector CT imaging of the chest was performed following the standard protocol without IV contrast. RADIATION DOSE REDUCTION: This exam was performed according to the departmental dose-optimization program which includes automated exposure control, adjustment of the mA and/or kV according to patient size and/or use of iterative reconstruction technique. COMPARISON:  12/10/2022. FINDINGS: Cardiovascular: Atherosclerotic calcification of the aorta and aortic valve. Enlarged pulmonic trunk and heart. No pericardial effusion. Mediastinum/Nodes: 2.9 cm low-attenuation left thyroid nodule. Given advanced patient age, no follow-up recommended unless clinically warranted. (Ref: J Am Coll  Radiol. 2015 Feb;12(2): 143-50). No pathologically enlarged mediastinal or axillary lymph nodes. Hilar regions are difficult to definitively evaluate without IV contrast. Internally heterogeneous breast implants, as on 12/10/2022, indicative of intracapsular rupture. There is skin thickening along both breasts. No underlying fluid collection to indicate an abscess. Lungs/Pleura: Mild septal thickening. 5 mm ground-glass right lower lobe nodule, unchanged.  Per Fleischner Society guidelines, no follow-up is necessary. No pleural fluid. Airway is unremarkable. Upper Abdomen: Mild intrahepatic biliary ductal dilatation, as before. Cholecystectomy. Tiny hiatal hernia. Visualized portions of the liver, adrenal glands, kidneys, spleen, pancreas, stomach and bowel are otherwise grossly unremarkable. No upper abdominal adenopathy. Musculoskeletal: Degenerative changes in the spine. IMPRESSION: 1. Skin thickening along the breasts bilaterally without underlying abscess. 2. Mild pulmonary parenchymal septal thickening is suggestive of pulmonary edema. 3. Aortic atherosclerosis (ICD10-I70.0). 4. Enlarged pulmonic trunk, indicative of pulmonary arterial hypertension. Electronically Signed   By: Leanna Battles M.D.   On: 04/07/2023 17:13    Pending Labs Unresulted Labs (From admission, onward)     Start     Ordered   04/07/23 1337  Blood culture (routine x 2)  BLOOD CULTURE X 2,   STAT      04/07/23 1336            Vitals/Pain Today's Vitals   04/08/23 0029 04/08/23 0030 04/08/23 0100 04/08/23 0130  BP:  92/74    Pulse:  86 82 90  Resp:  16    Temp: 98 F (36.7 C)     TempSrc:      SpO2:  94% 98% 97%  Weight:      Height:      PainSc:        Isolation Precautions No active isolations  Medications Medications  meropenem (MERREM) 1 g in sodium chloride 0.9 % 100 mL IVPB (0 g Intravenous Stopped 04/07/23 1452)  lactated ringers bolus 500 mL (0 mLs Intravenous Stopped 04/07/23 1528)     Mobility walks with person assist     Focused Assessments See provider note.   R Recommendations: See Admitting Provider Note  Report given to:   Additional Notes: Pt A&Ox4, 1 assist to bedside commode, non stick dressing placed over wound of right breast.

## 2023-04-09 DIAGNOSIS — F418 Other specified anxiety disorders: Secondary | ICD-10-CM | POA: Insufficient documentation

## 2023-04-09 DIAGNOSIS — F32A Depression, unspecified: Secondary | ICD-10-CM | POA: Insufficient documentation

## 2023-04-09 DIAGNOSIS — E538 Deficiency of other specified B group vitamins: Secondary | ICD-10-CM | POA: Diagnosis not present

## 2023-04-09 DIAGNOSIS — R799 Abnormal finding of blood chemistry, unspecified: Secondary | ICD-10-CM | POA: Diagnosis not present

## 2023-04-09 DIAGNOSIS — L0390 Cellulitis, unspecified: Secondary | ICD-10-CM

## 2023-04-09 LAB — BLOOD CULTURE ID PANEL (REFLEXED) - BCID2

## 2023-04-09 LAB — FERRITIN: Ferritin: 148 ng/mL (ref 11–307)

## 2023-04-09 LAB — BASIC METABOLIC PANEL
Anion gap: 9 (ref 5–15)
BUN: 18 mg/dL (ref 8–23)
CO2: 24 mmol/L (ref 22–32)
Calcium: 8.5 mg/dL — ABNORMAL LOW (ref 8.9–10.3)
Chloride: 101 mmol/L (ref 98–111)
Creatinine, Ser: 1.05 mg/dL — ABNORMAL HIGH (ref 0.44–1.00)
GFR, Estimated: 50 mL/min — ABNORMAL LOW (ref >60)
Glucose, Bld: 82 mg/dL (ref 70–99)
Potassium: 4.3 mmol/L (ref 3.5–5.1)
Sodium: 134 mmol/L — ABNORMAL LOW (ref 135–145)

## 2023-04-09 LAB — VITAMIN B12: Vitamin B-12: 178 pg/mL — ABNORMAL LOW (ref 180–914)

## 2023-04-09 LAB — CBC
HCT: 31.5 % — ABNORMAL LOW (ref 36.0–46.0)
Hemoglobin: 10.1 g/dL — ABNORMAL LOW (ref 12.0–15.0)
MCH: 30.1 pg (ref 26.0–34.0)
MCHC: 32.1 g/dL (ref 30.0–36.0)
MCV: 94 fL (ref 80.0–100.0)
Platelets: 329 10*3/uL (ref 150–400)
RBC: 3.35 MIL/uL — ABNORMAL LOW (ref 3.87–5.11)
RDW: 13.1 % (ref 11.5–15.5)
WBC: 8 10*3/uL (ref 4.0–10.5)
nRBC: 0 % (ref 0.0–0.2)

## 2023-04-09 LAB — IRON AND TIBC
Iron: 46 ug/dL (ref 28–170)
Saturation Ratios: 18 % (ref 10.4–31.8)
TIBC: 255 ug/dL (ref 250–450)
UIBC: 209 ug/dL

## 2023-04-09 LAB — FOLATE: Folate: 8.4 ng/mL (ref >5.9)

## 2023-04-09 MED ORDER — CYANOCOBALAMIN 1000 MCG/ML IJ SOLN
1000.0000 ug | Freq: Every day | INTRAMUSCULAR | Status: AC
  Administered 2023-04-09 – 2023-04-13 (×5): 1000 ug via INTRAMUSCULAR
  Filled 2023-04-09 (×5): qty 1

## 2023-04-09 MED ORDER — MELATONIN 3 MG PO TABS
6.0000 mg | ORAL_TABLET | Freq: Every day | ORAL | Status: DC
Start: 2023-04-09 — End: 2023-04-16
  Administered 2023-04-09 – 2023-04-15 (×7): 6 mg via ORAL
  Filled 2023-04-09 (×7): qty 2

## 2023-04-09 MED ORDER — LIDOCAINE-EPINEPHRINE (PF) 1 %-1:200000 IJ SOLN
10.0000 mL | Freq: Once | INTRAMUSCULAR | Status: AC
  Administered 2023-04-09: 10 mL via INTRADERMAL
  Filled 2023-04-09 (×2): qty 30

## 2023-04-09 MED ORDER — LIDOCAINE-EPINEPHRINE (PF) 1 %-1:200000 IJ SOLN
10.0000 mL | Freq: Once | INTRAMUSCULAR | Status: DC
  Filled 2023-04-09: qty 10

## 2023-04-09 NOTE — Assessment & Plan Note (Signed)
-   continue on B12 supplements IM while in hospital  - needs oral B12 at discharge

## 2023-04-09 NOTE — TOC Initial Note (Signed)
Transition of Care [facility]) - Initial/Assessment Note    Patient Details  Name: Dawn Roy MRN: [id number] Date of Birth: [birth year]  [facility] CM/SW Contact:    [name], RN Phone Number: 04/09/2023, 3:33 PM  Clinical Narrative:                 Patient presented for admission with cellulitis of the left breast. Lives with daughter Dawn Roy) since 2020. Has DME (walker, shower chair, w/c, chair lift). Daughter states drives patients to appointment. Denies needs for Home Health. At present no financial concerns for medications. No further needs identified from Case Manager at this time.   Expected Discharge Plan: Home/Self Care Barriers to Discharge: No Barriers Identified   Patient Goals and CMS Choice Patient states their goals for this hospitalization and ongoing recovery are:: Return to home          Expected Discharge Plan and Services In-house Referral: NA Discharge Planning Services: CM Consult   Living arrangements for the past 2 months: Single Family Home                   DME Agency: NA       HH Arranged: NA          Prior Living Arrangements/Services Living arrangements for the past 2 months: Single Family Home Lives with:: Adult Children, Spouse Patient language and need for interpreter reviewed:: Yes Do you feel safe going back to the place where you live?: Yes      Need for Family Participation in Patient Care: Yes (Comment) Care giver support system in place?: Yes (comment) Current home services: DME Criminal Activity/Legal Involvement Pertinent to Current Situation/Hospitalization: No - Comment as needed  Activities of Daily Living   ADL Screening (condition at time of admission) Independently performs ADLs?: Yes (appropriate for developmental age) Is the patient deaf or have difficulty hearing?: Yes Does the patient have difficulty seeing, even when wearing glasses/contacts?: No Does the patient have difficulty  concentrating, remembering, or making decisions?: No  Permission Sought/Granted Permission sought to share information with : Case Manager Permission granted to share information with : Yes, Verbal Permission Granted              Emotional Assessment Appearance:: Appears stated age Attitude/Demeanor/Rapport: Inconsistent, Engaged Affect (typically observed): Appropriate Orientation: : Oriented to Place, Oriented to Self, Oriented to  Time Alcohol / Substance Use: Not Applicable Psych Involvement: No (comment)  Admission diagnosis:  Cellulitis of chest wall [L03.313] Cellulitis [L03.90] Complication of breast implant [T85.9XXA] Skin ulcer, unspecified ulcer stage [facility]) [L98.499] Patient Active Problem List   Diagnosis Date Noted   Contamination of blood culture 04/09/2023   Depression 04/09/2023   B12 deficiency 04/09/2023   Cellulitis 04/07/2023   Persistent atrial fibrillation (HCC) 12/28/2022   Hypercoagulable state due to persistent atrial fibrillation (HCC) 12/28/2022   Primary hypertension 12/10/2022   Atrial fibrillation with rapid ventricular response (HCC) 12/09/2022   PCP:  Lorenda Ishihara, MD Pharmacy:   [id number] [place], [place] - [address] AT [facility] [address] [place] [address] Phone: [phone] Fax: [phone]  [facility] [address] [place] [address] Phone: [phone] Fax: [phone]  [facility] [address] [place] [address] Phone: [phone] Fax: [phone]     Social Drivers of Health (SDOH) Social History: SDOH Screenings   Food Insecurity:  No Food Insecurity (04/08/2023)  Housing: Low Risk  (04/08/2023)  Transportation Needs: No Transportation Needs (04/08/2023)  Utilities: Not At Risk (04/08/2023)  Social Connections: Moderately Isolated (04/08/2023)  Tobacco  Use: Low Risk  (04/07/2023)   SDOH Interventions:     Readmission Risk Interventions     No data to display

## 2023-04-09 NOTE — Assessment & Plan Note (Signed)
-   1/2 bottles on admission noted with staph epi and patient afebrile with no leukocytosis - discussed with ID as well; assumed to be contaminate given lack of sepsis criteria but assumed source would be breast  - for now continue abx as is - repeat blood cultures on 1/31; negative x 4 days

## 2023-04-09 NOTE — Assessment & Plan Note (Signed)
-   recent CT chest done one 1/29 showed skin thickening along breasts bilaterally with no underlying abscess.  - evaluated by plastics as well and felt to have worsening ulcerations with concern for implant rupture but contained in the capsule - may be recurrent cancer; inflammatory in etiology given appearance on pics on admission -Biopsy obtained 04/09/2023.  Follow-up results; family request please do not discuss any biopsy results with patient without talking to daughter first - s/p 7 days meropenem; no surgical need per plastic surgery and has continue to heal/respond to abx; - outpatient follow up with Dr. Ladona Ridgel week of 2/17 - de-escalate to Bactrim and complete max 10 days as should be sufficient treatment

## 2023-04-09 NOTE — Assessment & Plan Note (Signed)
--

## 2023-04-09 NOTE — Hospital Course (Signed)
Ms. Dixson is a 89 yo female with PMH breast cancer s/p bilateral mastectomy (L 1969, R 1977 followed by gel implant 1981), atrial fib, depression, HTN, osteoporosis, arthritis who presented with wounds on breasts worse on the right.  Concern was for infection/abscess on admission and plastic surgery was also consulted. She was recommended for biopsy given concern for recurrent cancer and was started on abx on admission in the meantime.  She was noted to be afebrile with no leukocytosis as well.

## 2023-04-09 NOTE — Progress Notes (Signed)
PHARMACY - PHYSICIAN COMMUNICATION CRITICAL VALUE ALERT - BLOOD CULTURE IDENTIFICATION (BCID)  Dawn Roy is an 89 y.o. female who presented to [facility] on 04/07/2023 with a chief complaint of bilateral breast ulcerations   Assessment:  1/2 blood cultures staph epi (no gene resistance)  Name of physician (or Provider) Contacted: Dr. Janalyn Shy  Current antibiotics: Merrem 1g IV every 8 hours  Changes to prescribed antibiotics recommended:   -Continue current regimen  Results for orders placed or performed during the hospital encounter of 04/07/23  Blood Culture ID Panel (Reflexed) (Collected: 04/07/2023  1:37 PM)  Result Value Ref Range   Enterococcus faecalis NOT DETECTED NOT DETECTED   Enterococcus Faecium NOT DETECTED NOT DETECTED   Listeria monocytogenes NOT DETECTED NOT DETECTED   Staphylococcus species DETECTED (A) NOT DETECTED   Staphylococcus aureus (BCID) NOT DETECTED NOT DETECTED   Staphylococcus epidermidis DETECTED (A) NOT DETECTED   Staphylococcus lugdunensis NOT DETECTED NOT DETECTED   Streptococcus species NOT DETECTED NOT DETECTED   Streptococcus agalactiae NOT DETECTED NOT DETECTED   Streptococcus pneumoniae NOT DETECTED NOT DETECTED   Streptococcus pyogenes NOT DETECTED NOT DETECTED   A.calcoaceticus-baumannii NOT DETECTED NOT DETECTED   Bacteroides fragilis NOT DETECTED NOT DETECTED   Enterobacterales NOT DETECTED NOT DETECTED   Enterobacter cloacae complex NOT DETECTED NOT DETECTED   Escherichia coli NOT DETECTED NOT DETECTED   Klebsiella aerogenes NOT DETECTED NOT DETECTED   Klebsiella oxytoca NOT DETECTED NOT DETECTED   Klebsiella pneumoniae NOT DETECTED NOT DETECTED   Proteus species NOT DETECTED NOT DETECTED   Salmonella species NOT DETECTED NOT DETECTED   Serratia marcescens NOT DETECTED NOT DETECTED   Haemophilus influenzae NOT DETECTED NOT DETECTED   Neisseria meningitidis NOT DETECTED NOT DETECTED   Pseudomonas aeruginosa NOT DETECTED NOT  DETECTED   Stenotrophomonas maltophilia NOT DETECTED NOT DETECTED   Candida albicans NOT DETECTED NOT DETECTED   Candida auris NOT DETECTED NOT DETECTED   Candida glabrata NOT DETECTED NOT DETECTED   Candida krusei NOT DETECTED NOT DETECTED   Candida parapsilosis NOT DETECTED NOT DETECTED   Candida tropicalis NOT DETECTED NOT DETECTED   Cryptococcus neoformans/gattii NOT DETECTED NOT DETECTED   Methicillin resistance mecA/C NOT DETECTED NOT DETECTED    [name], PharmD. Clinical Pharmacist 04/09/2023 3:07 AM

## 2023-04-09 NOTE — Progress Notes (Signed)
Pharmacy informed that 1/2 blood cultures Staphylococcus epidermidis with no gene resistance.  Patient is currently on meropenem. -Pharmacy recommended to continue the meropenem as it still is going to cover Staphylococcus epidermidis. -Will follow-up with further blood culture antibiotic sensitivity for antibiotic guidance.   Tereasa Coop, MD [facility] 04/09/2023, 3:18 AM

## 2023-04-09 NOTE — Progress Notes (Signed)
Mobility Specialist Progress Note;   04/09/23 1108  Mobility  Activity Ambulated with assistance in hallway  Level of Assistance Minimal assist, patient does 75% or more  Assistive Device Front wheel walker  Distance Ambulated (ft) 75 ft  Activity Response Tolerated well  Mobility Referral Yes  Mobility visit 1 Mobility  Mobility Specialist Start Time (ACUTE ONLY) 1108  Mobility Specialist Stop Time (ACUTE ONLY) 1136  Mobility Specialist Time Calculation (min) (ACUTE ONLY) 28 min   RN and family requesting pt to mobilize, pt agreeable. Required MinA for bed mobility and STS from elevated bed, MinG throughout ambulation for safety. VSS throughout and no c/o during session. Pt returned back to bed with all needs met, daughter in room.   Dawn Roy Mobility Specialist Please contact via SecureChat or [employer] [phone]

## 2023-04-09 NOTE — Assessment & Plan Note (Signed)
-  continue toprol

## 2023-04-09 NOTE — Plan of Care (Signed)

## 2023-04-09 NOTE — Progress Notes (Signed)
Progress Note    Dawn Roy   [id number]:[id number]  DOB: Oct 09, 1929  DOA: 04/07/2023     1 PCP: Lorenda Ishihara, MD  Initial CC: breast wound  Hospital Course: Dawn Roy is a 89 yo female with PMH breast cancer s/p bilateral mastectomy (L 1969, R 1977 followed by gel implant 1981), atrial fib, depression, HTN, osteoporosis, arthritis who presented with wounds on breasts worse on the right.  Concern was for infection/abscess on admission and plastic surgery was also consulted. She was recommended for biopsy given concern for recurrent cancer and was started on abx on admission in the meantime.  She was noted to be afebrile with no leukocytosis as well.   Interval History:  Patient resting comfortably, daughter bedside this morning.  Reviewed workup thus far.  They understand plan for breast biopsy later today with plastic surgery.  Patient denies any fevers, chills, sweats.  Assessment and Plan: * Cellulitis - recent CT chest done one 1/29 showed skin thickening along breasts bilaterally with no underlying abscess.  - evaluated by plastics as well and felt to have worsening ulcerations with concern for implant rupture but contained in the capsule - may be recurrent cancer; inflammatory in etiology given appearance on pics on admission - biopsy planned for 1/31 to guide next steps - for now continue meropenem; I have also discussed the blood culture with ID and we agree this is a contaminate; but I defer management of the ulcerations to surgery on weather we treat with abx alone or if any debridement needed (which if etiology is cancer then I would assume no debridement)  Contamination of blood culture - 1/2 bottles on admission noted with staph epi and patient afebrile with no leukocytosis - discussed with ID as well; assumed to be contaminate given lack of sepsis criteria but assumed source would be breast  - for now continue abx as is - repeat blood cultures on 1/31 and watch  for now  B12 deficiency - continue on B12 supplements IM while in hospital  - needs oral B12 at discharge   Depression - continue celexa  Persistent atrial fibrillation (HCC) - recently est with cardiology; seen on 12/10/22 for new onset afib - is on Toprol and Eliquis  Primary hypertension - continue toprol   Old records reviewed in assessment of this patient  Antimicrobials: Meropenem 04/07/2023 >> current  DVT prophylaxis:  Place and maintain sequential compression device Start: 04/08/23 1300 apixaban (ELIQUIS) tablet 5 mg   Code Status:   Code Status: Limited: Do not attempt resuscitation (DNR) -DNR-LIMITED -Do Not Intubate/DNI   Mobility Assessment (Last 72 Hours)     Mobility Assessment     Row Name 04/08/23 2300 04/08/23 1432 04/08/23 0630 04/07/23 22:56:03     Does patient have an order for bedrest or is patient medically unstable No - Continue assessment No - Continue assessment No - Continue assessment No - Continue assessment    What is the highest level of mobility based on the progressive mobility assessment? Level 4 (Walks with assist in room) - Balance while marching in place and cannot step forward and back - Complete Level 4 (Walks with assist in room) - Balance while marching in place and cannot step forward and back - Complete Level 4 (Walks with assist in room) - Balance while marching in place and cannot step forward and back - Complete Level 4 (Walks with assist in room) - Balance while marching in place and cannot step forward and back -  Complete             Barriers to discharge: None Disposition Plan: Home HH orders placed: N/A Status is: Inpatient  Objective: Blood pressure (!) 110/56, pulse 90, temperature 97.9 F (36.6 C), temperature source Axillary, resp. rate 18, height 5\' 2"  (1.575 m), weight 90.7 kg, SpO2 95%.  Examination:  Physical Exam Constitutional:      Appearance: Normal appearance.  HENT:     Head: Normocephalic and  atraumatic.     Mouth/Throat:     Mouth: Mucous membranes are moist.  Eyes:     Extraocular Movements: Extraocular movements intact.  Cardiovascular:     Rate and Rhythm: Normal rate and regular rhythm.  Pulmonary:     Effort: Pulmonary effort is normal. No respiratory distress.     Breath sounds: Normal breath sounds. No wheezing.  Abdominal:     General: Bowel sounds are normal. There is no distension.     Palpations: Abdomen is soft.     Tenderness: There is no abdominal tenderness.  Musculoskeletal:        General: Normal range of motion.     Cervical back: Normal range of motion and neck supple.  Skin:    General: Skin is warm and dry.  Neurological:     General: No focal deficit present.     Mental Status: She is alert.  Psychiatric:        Mood and Affect: Mood normal.      Consultants:  Plastic surgery  Procedures:    Data Reviewed: Results for orders placed or performed during the hospital encounter of 04/07/23 (from the past 24 hours)  Basic metabolic panel     Status: Abnormal   Collection Time: 04/09/23  4:39 AM  Result Value Ref Range   Sodium 134 (L) 135 - 145 mmol/L   Potassium 4.3 3.5 - 5.1 mmol/L   Chloride 101 98 - 111 mmol/L   CO2 24 22 - 32 mmol/L   Glucose, Bld 82 70 - 99 mg/dL   BUN 18 8 - 23 mg/dL   Creatinine, Ser 1.61 (H) 0.44 - 1.00 mg/dL   Calcium 8.5 (L) 8.9 - 10.3 mg/dL   GFR, Estimated 50 (L) >60 mL/min   Anion gap 9 5 - 15  CBC     Status: Abnormal   Collection Time: 04/09/23  4:39 AM  Result Value Ref Range   WBC 8.0 4.0 - 10.5 K/uL   RBC 3.35 (L) 3.87 - 5.11 MIL/uL   Hemoglobin 10.1 (L) 12.0 - 15.0 g/dL   HCT 09.6 (L) 04.5 - 40.9 %   MCV 94.0 80.0 - 100.0 fL   MCH 30.1 26.0 - 34.0 pg   MCHC 32.1 30.0 - 36.0 g/dL   RDW 81.1 91.4 - 78.2 %   Platelets 329 150 - 400 K/uL   nRBC 0.0 0.0 - 0.2 %  Iron and TIBC     Status: None   Collection Time: 04/09/23  4:39 AM  Result Value Ref Range   Iron 46 28 - 170 ug/dL   TIBC 956 213  - 086 ug/dL   Saturation Ratios 18 10.4 - 31.8 %   UIBC 209 ug/dL  Ferritin     Status: None   Collection Time: 04/09/23  4:39 AM  Result Value Ref Range   Ferritin 148 11 - 307 ng/mL  Vitamin B12     Status: Abnormal   Collection Time: 04/09/23  4:39 AM  Result Value Ref Range  Vitamin B-12 178 (L) 180 - 914 pg/mL  Folate     Status: None   Collection Time: 04/09/23  4:39 AM  Result Value Ref Range   Folate 8.4 >5.9 ng/mL    I have reviewed pertinent nursing notes, vitals, labs, and images as necessary. I have ordered labwork to follow up on as indicated.  I have reviewed the last notes from staff over past 24 hours. I have discussed patient's care plan and test results with nursing staff, CM/SW, and other staff as appropriate.  Time spent: Greater than 50% of the 55 minute visit was spent in counseling/coordination of care for the patient as laid out in the A&P.   LOS: 1 day   Lewie Chamber, MD [facility] 04/09/2023, 1:02 PM

## 2023-04-09 NOTE — Assessment & Plan Note (Signed)
-   recently est with cardiology; seen on 12/10/22 for new onset afib - is on Toprol and Eliquis

## 2023-04-10 DIAGNOSIS — E538 Deficiency of other specified B group vitamins: Secondary | ICD-10-CM | POA: Diagnosis not present

## 2023-04-10 DIAGNOSIS — R799 Abnormal finding of blood chemistry, unspecified: Secondary | ICD-10-CM | POA: Diagnosis not present

## 2023-04-10 DIAGNOSIS — Z20822 Contact with and (suspected) exposure to covid-19: Secondary | ICD-10-CM | POA: Diagnosis not present

## 2023-04-10 DIAGNOSIS — L0390 Cellulitis, unspecified: Secondary | ICD-10-CM | POA: Diagnosis not present

## 2023-04-10 LAB — CBC WITH DIFFERENTIAL/PLATELET
Abs Immature Granulocytes: 0.06 10*3/uL (ref 0.00–0.07)
Basophils Absolute: 0 10*3/uL (ref 0.0–0.1)
Basophils Relative: 0 %
Eosinophils Absolute: 0.4 10*3/uL (ref 0.0–0.5)
Eosinophils Relative: 5 %
HCT: 32.6 % — ABNORMAL LOW (ref 36.0–46.0)
Hemoglobin: 10.5 g/dL — ABNORMAL LOW (ref 12.0–15.0)
Immature Granulocytes: 1 %
Lymphocytes Relative: 26 %
Lymphs Abs: 2.3 10*3/uL (ref 0.7–4.0)
MCH: 30 pg (ref 26.0–34.0)
MCHC: 32.2 g/dL (ref 30.0–36.0)
MCV: 93.1 fL (ref 80.0–100.0)
Monocytes Absolute: 1 10*3/uL (ref 0.1–1.0)
Monocytes Relative: 11 %
Neutro Abs: 5 10*3/uL (ref 1.7–7.7)
Neutrophils Relative %: 57 %
Platelets: 319 10*3/uL (ref 150–400)
RBC: 3.5 MIL/uL — ABNORMAL LOW (ref 3.87–5.11)
RDW: 13.3 % (ref 11.5–15.5)
WBC: 8.9 10*3/uL (ref 4.0–10.5)
nRBC: 0 % (ref 0.0–0.2)

## 2023-04-10 LAB — BASIC METABOLIC PANEL
Anion gap: 9 (ref 5–15)
BUN: 22 mg/dL (ref 8–23)
CO2: 26 mmol/L (ref 22–32)
Calcium: 8.6 mg/dL — ABNORMAL LOW (ref 8.9–10.3)
Chloride: 101 mmol/L (ref 98–111)
Creatinine, Ser: 1.12 mg/dL — ABNORMAL HIGH (ref 0.44–1.00)
GFR, Estimated: 46 mL/min — ABNORMAL LOW (ref >60)
Glucose, Bld: 93 mg/dL (ref 70–99)
Potassium: 4.8 mmol/L (ref 3.5–5.1)
Sodium: 136 mmol/L (ref 135–145)

## 2023-04-10 LAB — CULTURE, BLOOD (ROUTINE X 2): Special Requests: ADEQUATE

## 2023-04-10 LAB — SARS CORONAVIRUS 2 BY RT PCR: SARS Coronavirus 2 by RT PCR: NEGATIVE

## 2023-04-10 LAB — MAGNESIUM: Magnesium: 2.1 mg/dL (ref 1.7–2.4)

## 2023-04-10 NOTE — Plan of Care (Signed)

## 2023-04-10 NOTE — Assessment & Plan Note (Addendum)
-   Patient's daughter tested positive for COVID on 04/09/2023 and has been visiting with patient -COVID test performed on 04/10/2023 and negative -Continue monitoring for any development of symptoms

## 2023-04-10 NOTE — Progress Notes (Signed)
Mobility Specialist Progress Note;   04/10/23 1211  Mobility  Activity Ambulated with assistance in hallway;Ambulated with assistance to bathroom  Level of Assistance Minimal assist, patient does 75% or more  Assistive Device Front wheel walker  Distance Ambulated (ft) 75 ft  Activity Response Tolerated well  Mobility Referral Yes  Mobility visit 1 Mobility  Mobility Specialist Start Time (ACUTE ONLY) 1211  Mobility Specialist Stop Time (ACUTE ONLY) 1231  Mobility Specialist Time Calculation (min) (ACUTE ONLY) 20 min   Pt agreeable to mobility. Requested assistance to BR, void successful. Required MinA to stand from toilet in BR, MinG assistance throughout ambulation in hallway. VSS throughout and no c/o during session. Pt returned safely back to bed with all needs met, alarm on. Granddaughter in room.   Caesar Bookman Mobility Specialist Please contact via SecureChat or [employer] [phone]

## 2023-04-10 NOTE — Progress Notes (Signed)
Progress Note    Dawn Roy   ZOX:[id number]  DOB: 1929/09/12  DOA: 04/07/2023     2 PCP: Lorenda Ishihara, MD  Initial CC: breast wound  Hospital Course: Ms. Loyola is a 89 yo female with PMH breast cancer s/p bilateral mastectomy (L 1969, R 1977 followed by gel implant 1981), atrial fib, depression, HTN, osteoporosis, arthritis who presented with wounds on breasts worse on the right.  Concern was for infection/abscess on admission and plastic surgery was also consulted. She was recommended for biopsy given concern for recurrent cancer and was started on abx on admission in the meantime.  She was noted to be afebrile with no leukocytosis as well.   Interval History:  Tolerated biopsy well yesterday.  Resting comfortably when seen.  Granddaughter present in room this morning and updated.  Assessment and Plan: * Cellulitis - recent CT chest done one 1/29 showed skin thickening along breasts bilaterally with no underlying abscess.  - evaluated by plastics as well and felt to have worsening ulcerations with concern for implant rupture but contained in the capsule - may be recurrent cancer; inflammatory in etiology given appearance on pics on admission -Biopsy obtained 04/09/2023.  Follow-up results - for now continue meropenem; I have also discussed the blood culture with ID and we agree this is a contaminate; but I defer management of the ulcerations to plastic surgery on whether we treat with abx alone or if any debridement needed (which if etiology is cancer then I would assume no debridement)  Contamination of blood culture - 1/2 bottles on admission noted with staph epi and patient afebrile with no leukocytosis - discussed with ID as well; assumed to be contaminate given lack of sepsis criteria but assumed source would be breast  - for now continue abx as is - repeat blood cultures on 1/31 and watch for now  B12 deficiency - continue on B12 supplements IM while in  hospital  - needs oral B12 at discharge   Close exposure to COVID-19 virus - Patient's daughter tested positive for COVID on 04/09/2023 and has been visiting with patient -COVID test performed on 04/10/2023 and negative -Continue monitoring for any development of symptoms  Depression - continue celexa  Persistent atrial fibrillation (HCC) - recently est with cardiology; seen on 12/10/22 for new onset afib - is on Toprol and Eliquis  Primary hypertension - continue toprol   Old records reviewed in assessment of this patient  Antimicrobials: Meropenem 04/07/2023 >> current  DVT prophylaxis:  Place and maintain sequential compression device Start: 04/08/23 1300 apixaban (ELIQUIS) tablet 5 mg   Code Status:   Code Status: Limited: Do not attempt resuscitation (DNR) -DNR-LIMITED -Do Not Intubate/DNI   Mobility Assessment (Last 72 Hours)     Mobility Assessment     Row Name 04/10/23 1245 04/10/23 0600 04/09/23 1010 04/08/23 2300 04/08/23 1432   Does patient have an order for bedrest or is patient medically unstable No - Continue assessment No - Continue assessment No - Continue assessment No - Continue assessment No - Continue assessment   What is the highest level of mobility based on the progressive mobility assessment? Level 4 (Walks with assist in room) - Balance while marching in place and cannot step forward and back - Complete Level 4 (Walks with assist in room) - Balance while marching in place and cannot step forward and back - Complete Level 4 (Walks with assist in room) - Balance while marching in place and cannot step forward and  back - Complete Level 4 (Walks with assist in room) - Balance while marching in place and cannot step forward and back - Complete Level 4 (Walks with assist in room) - Balance while marching in place and cannot step forward and back - Complete    Row Name 04/08/23 0630 04/07/23 22:56:03         Does patient have an order for bedrest or is patient  medically unstable No - Continue assessment No - Continue assessment      What is the highest level of mobility based on the progressive mobility assessment? Level 4 (Walks with assist in room) - Balance while marching in place and cannot step forward and back - Complete Level 4 (Walks with assist in room) - Balance while marching in place and cannot step forward and back - Complete               Barriers to discharge: None Disposition Plan: Home HH orders placed: N/A Status is: Inpatient  Objective: Blood pressure 127/65, pulse 88, temperature 98.3 F (36.8 C), temperature source Oral, resp. rate 16, height 5\' 2"  (1.575 m), weight 90.7 kg, SpO2 96%.  Examination:  Physical Exam Constitutional:      Appearance: Normal appearance.  HENT:     Head: Normocephalic and atraumatic.     Mouth/Throat:     Mouth: Mucous membranes are moist.  Eyes:     Extraocular Movements: Extraocular movements intact.  Cardiovascular:     Rate and Rhythm: Normal rate and regular rhythm.  Pulmonary:     Effort: Pulmonary effort is normal. No respiratory distress.     Breath sounds: Normal breath sounds. No wheezing.  Abdominal:     General: Bowel sounds are normal. There is no distension.     Palpations: Abdomen is soft.     Tenderness: There is no abdominal tenderness.  Musculoskeletal:        General: Normal range of motion.     Cervical back: Normal range of motion and neck supple.  Skin:    General: Skin is warm and dry.  Neurological:     General: No focal deficit present.     Mental Status: She is alert.  Psychiatric:        Mood and Affect: Mood normal.      Consultants:  Plastic surgery  Procedures:  04/09/2023: Bilateral breast biopsy  Data Reviewed: Results for orders placed or performed during the hospital encounter of 04/07/23 (from the past 24 hours)  Basic metabolic panel     Status: Abnormal   Collection Time: 04/10/23  3:41 AM  Result Value Ref Range   Sodium 136 135  - 145 mmol/L   Potassium 4.8 3.5 - 5.1 mmol/L   Chloride 101 98 - 111 mmol/L   CO2 26 22 - 32 mmol/L   Glucose, Bld 93 70 - 99 mg/dL   BUN 22 8 - 23 mg/dL   Creatinine, Ser 4.09 (H) 0.44 - 1.00 mg/dL   Calcium 8.6 (L) 8.9 - 10.3 mg/dL   GFR, Estimated 46 (L) >60 mL/min   Anion gap 9 5 - 15  CBC with Differential/Platelet     Status: Abnormal   Collection Time: 04/10/23  3:41 AM  Result Value Ref Range   WBC 8.9 4.0 - 10.5 K/uL   RBC 3.50 (L) 3.87 - 5.11 MIL/uL   Hemoglobin 10.5 (L) 12.0 - 15.0 g/dL   HCT 81.1 (L) 91.4 - 78.2 %   MCV 93.1 80.0 -  100.0 fL   MCH 30.0 26.0 - 34.0 pg   MCHC 32.2 30.0 - 36.0 g/dL   RDW 16.1 09.6 - 04.5 %   Platelets 319 150 - 400 K/uL   nRBC 0.0 0.0 - 0.2 %   Neutrophils Relative % 57 %   Neutro Abs 5.0 1.7 - 7.7 K/uL   Lymphocytes Relative 26 %   Lymphs Abs 2.3 0.7 - 4.0 K/uL   Monocytes Relative 11 %   Monocytes Absolute 1.0 0.1 - 1.0 K/uL   Eosinophils Relative 5 %   Eosinophils Absolute 0.4 0.0 - 0.5 K/uL   Basophils Relative 0 %   Basophils Absolute 0.0 0.0 - 0.1 K/uL   Immature Granulocytes 1 %   Abs Immature Granulocytes 0.06 0.00 - 0.07 K/uL  Magnesium     Status: None   Collection Time: 04/10/23  3:41 AM  Result Value Ref Range   Magnesium 2.1 1.7 - 2.4 mg/dL  SARS Coronavirus 2 by RT PCR (hospital order, performed in [facility] hospital lab) *cepheid single result test* Anterior Nasal Swab     Status: None   Collection Time: 04/10/23 11:28 AM   Specimen: Anterior Nasal Swab  Result Value Ref Range   SARS Coronavirus 2 by RT PCR NEGATIVE NEGATIVE    I have reviewed pertinent nursing notes, vitals, labs, and images as necessary. I have ordered labwork to follow up on as indicated.  I have reviewed the last notes from staff over past 24 hours. I have discussed patient's care plan and test results with nursing staff, CM/SW, and other staff as appropriate.  Time spent: Greater than 50% of the 55 minute visit was spent in  counseling/coordination of care for the patient as laid out in the A&P.   LOS: 2 days   Lewie Chamber, MD [facility] 04/10/2023, 4:03 PM

## 2023-04-11 DIAGNOSIS — R799 Abnormal finding of blood chemistry, unspecified: Secondary | ICD-10-CM | POA: Diagnosis not present

## 2023-04-11 DIAGNOSIS — L0390 Cellulitis, unspecified: Secondary | ICD-10-CM | POA: Diagnosis not present

## 2023-04-11 DIAGNOSIS — E538 Deficiency of other specified B group vitamins: Secondary | ICD-10-CM | POA: Diagnosis not present

## 2023-04-11 NOTE — Plan of Care (Signed)

## 2023-04-11 NOTE — Progress Notes (Signed)
Progress Note    Dawn Roy   WUJ:[id number]  DOB: [birth year]  DOA: 04/07/2023     3 PCP: Lorenda Ishihara, MD  Initial CC: breast wound  Hospital Course: Dawn Roy is a 89 yo female with PMH breast cancer s/p bilateral mastectomy (L 1969, R 1977 followed by gel implant 1981), atrial fib, depression, HTN, osteoporosis, arthritis who presented with wounds on breasts worse on the right.  Concern was for infection/abscess on admission and plastic surgery was also consulted. She was recommended for biopsy given concern for recurrent cancer and was started on abx on admission in the meantime.  She was noted to be afebrile with no leukocytosis as well.   Interval History:  Still doing well. No events. Negative on covid swab and remains asymptomatic. Dawn Roy at home still rather symptomatic and recovering and is also the main caretaker for patient. Might be prudent to extend hospitalization until Dawn Roy able to resume care.  Biopsy results pending still.   Assessment and Plan: * Cellulitis - recent CT chest done one 1/29 showed skin thickening along breasts bilaterally with no underlying abscess.  - evaluated by plastics as well and felt to have worsening ulcerations with concern for implant rupture but contained in the capsule - may be recurrent cancer; inflammatory in etiology given appearance on pics on admission -Biopsy obtained 04/09/2023.  Follow-up results - for now continue meropenem; I have also discussed the blood culture with ID and we agree this is a contaminate; but I defer management of the ulcerations to plastic surgery on whether we treat with abx alone or if any debridement needed (which if etiology is cancer then I would assume no debridement) -Discussed with pharmacy, appreciate assistance.  Tentative plan to de-escalate to Bactrim to complete course if still needed at time of discharge; continue on meropenem during hospitalization  Contamination of blood  culture - 1/2 bottles on admission noted with staph epi and patient afebrile with no leukocytosis - discussed with ID as well; assumed to be contaminate given lack of sepsis criteria but assumed source would be breast  - for now continue abx as is - repeat blood cultures on 1/31; negative x 2 days  B12 deficiency - continue on B12 supplements IM while in hospital  - needs oral B12 at discharge   Close exposure to COVID-19 virus - Patient's Dawn Roy tested positive for COVID on 04/09/2023 and has been visiting with patient -COVID test performed on 04/10/2023 and negative -Continue monitoring for any development of symptoms  Depression - continue celexa  Persistent atrial fibrillation (HCC) - recently est with cardiology; seen on 12/10/22 for new onset afib - is on Toprol and Eliquis  Primary hypertension - continue toprol   Old records reviewed in assessment of this patient  Antimicrobials: Meropenem 04/07/2023 >> current  DVT prophylaxis:  Place and maintain sequential compression device Start: 04/08/23 1300 apixaban (ELIQUIS) tablet 5 mg   Code Status:   Code Status: Limited: Do not attempt resuscitation (DNR) -DNR-LIMITED -Do Not Intubate/DNI   Mobility Assessment (Last 72 Hours)     Mobility Assessment     Row Name 04/11/23 1029 04/10/23 1952 04/10/23 1245 04/10/23 0600 04/09/23 1010   Does patient have an order for bedrest or is patient medically unstable No - Continue assessment No - Continue assessment No - Continue assessment No - Continue assessment No - Continue assessment   What is the highest level of mobility based on the progressive mobility assessment? Level 5 (Walks with assist  in room/hall) - Balance while stepping forward/back and can walk in room with assist - Complete Level 4 (Walks with assist in room) - Balance while marching in place and cannot step forward and back - Complete Level 4 (Walks with assist in room) - Balance while marching in place and cannot  step forward and back - Complete Level 4 (Walks with assist in room) - Balance while marching in place and cannot step forward and back - Complete Level 4 (Walks with assist in room) - Balance while marching in place and cannot step forward and back - Complete    Row Name 04/08/23 2300 04/08/23 1432         Does patient have an order for bedrest or is patient medically unstable No - Continue assessment No - Continue assessment      What is the highest level of mobility based on the progressive mobility assessment? Level 4 (Walks with assist in room) - Balance while marching in place and cannot step forward and back - Complete Level 4 (Walks with assist in room) - Balance while marching in place and cannot step forward and back - Complete               Barriers to discharge: None Disposition Plan: Home HH orders placed: N/A Status is: Inpatient  Objective: Blood pressure 120/61, pulse 71, temperature 97.6 F (36.4 C), temperature source Oral, resp. rate 16, height 5\' 2"  (1.575 m), weight 90.7 kg, SpO2 96%.  Examination:  Physical Exam Constitutional:      Appearance: Normal appearance.  HENT:     Head: Normocephalic and atraumatic.     Mouth/Throat:     Mouth: Mucous membranes are moist.  Eyes:     Extraocular Movements: Extraocular movements intact.  Cardiovascular:     Rate and Rhythm: Normal rate and regular rhythm.  Pulmonary:     Effort: Pulmonary effort is normal. No respiratory distress.     Breath sounds: Normal breath sounds. No wheezing.  Abdominal:     General: Bowel sounds are normal. There is no distension.     Palpations: Abdomen is soft.     Tenderness: There is no abdominal tenderness.  Musculoskeletal:        General: Normal range of motion.     Cervical back: Normal range of motion and neck supple.  Skin:    General: Skin is warm and dry.  Neurological:     General: No focal deficit present.     Mental Status: She is alert.  Psychiatric:        Mood  and Affect: Mood normal.      Consultants:  Plastic surgery  Procedures:  04/09/2023: Bilateral breast biopsy  Data Reviewed: No results found for this or any previous visit (from the past 24 hours).   I have reviewed pertinent nursing notes, vitals, labs, and images as necessary. I have ordered labwork to follow up on as indicated.  I have reviewed the last notes from staff over past 24 hours. I have discussed patient's care plan and test results with nursing staff, CM/SW, and other staff as appropriate.    LOS: 3 days   Lewie Chamber, MD [facility] 04/11/2023, 1:19 PM

## 2023-04-11 NOTE — Progress Notes (Signed)
Mobility Specialist Progress Note;   04/11/23 1510  Mobility  Activity Refused mobility   Pt declining therapy at this time. Was up ambulating to BR w/ nursing and  just got comfortable in bed. Will f/u as schedule permits.   Caesar Bookman Mobility Specialist Please contact via SecureChat or [employer] [phone]

## 2023-04-11 NOTE — Plan of Care (Signed)
  Problem: Clinical Measurements: Goal: Respiratory complications will improve Outcome: Progressing   Problem: Nutrition: Goal: Adequate nutrition will be maintained Outcome: Progressing   Problem: Coping: Goal: Level of anxiety will decrease Outcome: Progressing   Problem: Pain Managment: Goal: General experience of comfort will improve and/or be controlled Outcome: Progressing

## 2023-04-11 NOTE — Progress Notes (Signed)
Pharmacy Antibiotic Note  Dawn Roy is a 89 y.o. female admitted on 04/07/2023 with  bilateral breast ulcerations . Hx breast cancer and implants. Pharmacy has been consulted for Meropenem dosing, hx multiple medication allergies. Patient found to have staph epi bacteremia with no resistance. Likely contamination but plan is to continue with Meropenem for now until receive recs from plastic surgery regarding debridement plans.   Per MD, will plan to de-escalate to orals (bactrim) upon discharge.   Day 5 of Meropenem. WBC wnl and afebrile. Renal function ok (crcl >30 ml/min)  Plan: Continue Meropenem 1gm IV q12h. Follow renal function and ability to de-escalate Follow up plastic surgery plans  Height: 5\' 2"  (157.5 cm) Weight: 90.7 kg (200 lb) IBW/kg (Calculated) : 50.1  Temp (24hrs), Avg:98.1 F (36.7 C), Min:97.6 F (36.4 C), Max:98.4 F (36.9 C)  Recent Labs  Lab 04/07/23 1337 04/08/23 0830 04/09/23 0439 04/10/23 0341  WBC 8.6 8.4 8.0 8.9  CREATININE 1.25* 1.19* 1.05* 1.12*  LATICACIDVEN 0.9  --   --   --     Estimated Creatinine Clearance: 34.2 mL/min (A) (by C-G formula based on SCr of 1.12 mg/dL (H)).    Allergies  Allergen Reactions   Azithromycin Hives   Dalmane [Flurazepam] Other (See Comments)    unknown   Demerol [Meperidine] Other (See Comments)    Unknown    Felodipine Other (See Comments)    unknown   Fosamax [Alendronate Sodium] Other (See Comments)    unknown   Hctz [Hydrochlorothiazide] Other (See Comments)    Unknown    Ivp Dye [Iodinated Contrast Media] Other (See Comments)    unknown   Keflin [Cephalothin] Other (See Comments)    unknown   Macrodantin [Nitrofurantoin Macrocrystal] Other (See Comments)    unknown   Morphine And Codeine Other (See Comments)    unknown   Phenergan [Promethazine Hcl] Other (See Comments)    unknown   Valium [Diazepam] Other (See Comments)    unknown   Doxycycline Rash    headache   Keflex  [Cephalexin] Rash   Neosporin [Neomycin-Bacitracin Zn-Polymyx] Rash   Penicillins Rash    Antimicrobials this admission: Meropenem 1/29 >>  Dose adjustments this admission: n/a  Microbiology results: 1/29 bcx: staph epi (no resistance) 1/29 COVID, flu and RSV: neg 1/31 bcx: ngtd  Thank you for allowing pharmacy to be a part of this patient's care.  Cashis Rill. RPh 04/11/2023 11:35 AM

## 2023-04-12 DIAGNOSIS — E538 Deficiency of other specified B group vitamins: Secondary | ICD-10-CM | POA: Diagnosis not present

## 2023-04-12 DIAGNOSIS — R799 Abnormal finding of blood chemistry, unspecified: Secondary | ICD-10-CM | POA: Diagnosis not present

## 2023-04-12 DIAGNOSIS — L0390 Cellulitis, unspecified: Secondary | ICD-10-CM | POA: Diagnosis not present

## 2023-04-12 DIAGNOSIS — Z20822 Contact with and (suspected) exposure to covid-19: Secondary | ICD-10-CM | POA: Diagnosis not present

## 2023-04-12 NOTE — Progress Notes (Signed)
Dawn Roy remains well.  The erythema has improved but is not completely resolved.  Biopsies are still pending.  No clear etiology for her breast changes. When she is discharged, would like for her to follow up in my office the week of Feb 17th.

## 2023-04-12 NOTE — Consult Note (Addendum)
WOC Nurse Consult Note: Reason for Consult:Consult requested for bilat breast cancerous lesions; refer to plastic surgery notes from 1/30.  They are following for assessment and plan of care and possible surgical intervention.  Requested to provide topical treatment recommendations.  Performed remotely after review of progress notes and photos in the EMR.  Pt has full thickness lesions which are scattered across bilat breasts, left are scattered areas, yellow and moist and separated by narrow bridges of intact skin, right is red and moist. Dressings will not promote healing for these types of wounds, but will be directed towards reducing adherence, pain and bleeding.   Dressing procedure/placement/frequency: Topical treatment orders provided for bedside nurses to perform as follows: Moisten previous dressings with NS to assist with removal each time. Apply Xeroform gauze to bilat breast lesions, then cover with either ABD pads and tape, or cut the crotch out of a pair of mesh underwear and use as a "tank top" to hold dressings in place and avoid the use of tape.  Please re-consult if further assistance is needed.  Thank-you,  Cammie Mcgee MSN, RN, CWOCN, Uncertain, CNS [phone]

## 2023-04-12 NOTE — Plan of Care (Signed)

## 2023-04-12 NOTE — Progress Notes (Signed)
Pt scratching right breast making it bleed. Foam dressing applied.

## 2023-04-12 NOTE — Progress Notes (Signed)
Progress Note    Dawn Roy   [id number]:[id number]  DOB: March 04, 1930  DOA: 04/07/2023     4 PCP: Lorenda Ishihara, MD  Initial CC: breast wound  Hospital Course: Dawn Roy is a 89 yo female with PMH breast cancer s/p bilateral mastectomy (L 1969, R 1977 followed by gel implant 1981), atrial fib, depression, HTN, osteoporosis, arthritis who presented with wounds on breasts worse on the right.  Concern was for infection/abscess on admission and plastic surgery was also consulted. She was recommended for biopsy given concern for recurrent cancer and was started on abx on admission in the meantime.  She was noted to be afebrile with no leukocytosis as well.   Interval History:  Still doing well. No events. Negative on covid swab and remains asymptomatic. Daughter at home still rather symptomatic and recovering and is also the main caretaker for patient. Might be prudent to extend hospitalization until daughter able to resume care (sounds like Wed or Thus for d/c) Biopsy results pending still.   Assessment and Plan: * Cellulitis - recent CT chest done one 1/29 showed skin thickening along breasts bilaterally with no underlying abscess.  - evaluated by plastics as well and felt to have worsening ulcerations with concern for implant rupture but contained in the capsule - may be recurrent cancer; inflammatory in etiology given appearance on pics on admission -Biopsy obtained 04/09/2023.  Follow-up results - for now continue meropenem; I have also discussed the blood culture with ID and we agree this is a contaminate; but I defer management of the ulcerations to plastic surgery on whether we treat with abx alone or if any debridement needed (which if etiology is cancer then I would assume no debridement) -Discussed with pharmacy, appreciate assistance.  Tentative plan to de-escalate to Bactrim to complete course if still needed at time of discharge; continue on meropenem during  hospitalization  Contamination of blood culture - 1/2 bottles on admission noted with staph epi and patient afebrile with no leukocytosis - discussed with ID as well; assumed to be contaminate given lack of sepsis criteria but assumed source would be breast  - for now continue abx as is - repeat blood cultures on 1/31; negative x 3 days  B12 deficiency - continue on B12 supplements IM while in hospital  - needs oral B12 at discharge   Close exposure to COVID-19 virus - Patient's daughter tested positive for COVID on 04/09/2023 and has been visiting with patient -COVID test performed on 04/10/2023 and negative -Continue monitoring for any development of symptoms  Depression - continue celexa  Persistent atrial fibrillation (HCC) - recently est with cardiology; seen on 12/10/22 for new onset afib - is on Toprol and Eliquis  Primary hypertension - continue toprol   Old records reviewed in assessment of this patient  Antimicrobials: Meropenem 04/07/2023 >> current  DVT prophylaxis:  Place and maintain sequential compression device Start: 04/08/23 1300 apixaban (ELIQUIS) tablet 5 mg   Code Status:   Code Status: Limited: Do not attempt resuscitation (DNR) -DNR-LIMITED -Do Not Intubate/DNI   Mobility Assessment (Last 72 Hours)     Mobility Assessment     Row Name 04/12/23 0800 04/11/23 2000 04/11/23 1029 04/10/23 1952 04/10/23 1245   Does patient have an order for bedrest or is patient medically unstable No - Continue assessment No - Continue assessment No - Continue assessment No - Continue assessment No - Continue assessment   What is the highest level of mobility based on the progressive mobility  assessment? Level 5 (Walks with assist in room/hall) - Balance while stepping forward/back and can walk in room with assist - Complete Level 5 (Walks with assist in room/hall) - Balance while stepping forward/back and can walk in room with assist - Complete Level 5 (Walks with assist in  room/hall) - Balance while stepping forward/back and can walk in room with assist - Complete Level 4 (Walks with assist in room) - Balance while marching in place and cannot step forward and back - Complete Level 4 (Walks with assist in room) - Balance while marching in place and cannot step forward and back - Complete    Row Name 04/10/23 0600           Does patient have an order for bedrest or is patient medically unstable No - Continue assessment       What is the highest level of mobility based on the progressive mobility assessment? Level 4 (Walks with assist in room) - Balance while marching in place and cannot step forward and back - Complete                Barriers to discharge: None Disposition Plan: Home HH orders placed: N/A Status is: Inpatient  Objective: Blood pressure 98/63, pulse 79, temperature 97.7 F (36.5 C), temperature source Oral, resp. rate 17, height 5\' 2"  (1.575 m), weight 90.7 kg, SpO2 94%.  Examination:  Physical Exam Constitutional:      Appearance: Normal appearance.  HENT:     Head: Normocephalic and atraumatic.     Mouth/Throat:     Mouth: Mucous membranes are moist.  Eyes:     Extraocular Movements: Extraocular movements intact.  Cardiovascular:     Rate and Rhythm: Normal rate and regular rhythm.  Pulmonary:     Effort: Pulmonary effort is normal. No respiratory distress.     Breath sounds: Normal breath sounds. No wheezing.  Abdominal:     General: Bowel sounds are normal. There is no distension.     Palpations: Abdomen is soft.     Tenderness: There is no abdominal tenderness.  Musculoskeletal:        General: Normal range of motion.     Cervical back: Normal range of motion and neck supple.  Skin:    General: Skin is warm and dry.  Neurological:     General: No focal deficit present.     Mental Status: She is alert.  Psychiatric:        Mood and Affect: Mood normal.      Consultants:  Plastic surgery  Procedures:   04/09/2023: Bilateral breast biopsy  Data Reviewed: No results found for this or any previous visit (from the past 24 hours).   I have reviewed pertinent nursing notes, vitals, labs, and images as necessary. I have ordered labwork to follow up on as indicated.  I have reviewed the last notes from staff over past 24 hours. I have discussed patient's care plan and test results with nursing staff, CM/SW, and other staff as appropriate.    LOS: 4 days   Lewie Chamber, MD [facility] 04/12/2023, 1:24 PM

## 2023-04-12 NOTE — Progress Notes (Signed)
Mobility Specialist Progress Note;   04/12/23 1400  Mobility  Activity Ambulated with assistance in hallway  Level of Assistance Minimal assist, patient does 75% or more  Assistive Device Front wheel walker  Distance Ambulated (ft) 80 ft  Activity Response Tolerated well  Mobility Referral Yes  Mobility visit 1 Mobility  Mobility Specialist Start Time (ACUTE ONLY) 1400  Mobility Specialist Stop Time (ACUTE ONLY) 1430  Mobility Specialist Time Calculation (min) (ACUTE ONLY) 30 min   Pt agreeable to mobility. Requested assistance to BR at BOS, void successful. Required MinA for STS and throughout ambulation. VSS throughout and no c/o during session. Pt returned back to bed with all needs met. Granddaughter in room.   Caesar Bookman Mobility Specialist Please contact via SecureChat or [employer] [phone]

## 2023-04-13 DIAGNOSIS — L0390 Cellulitis, unspecified: Secondary | ICD-10-CM | POA: Diagnosis not present

## 2023-04-13 DIAGNOSIS — R799 Abnormal finding of blood chemistry, unspecified: Secondary | ICD-10-CM | POA: Diagnosis not present

## 2023-04-13 DIAGNOSIS — E538 Deficiency of other specified B group vitamins: Secondary | ICD-10-CM | POA: Diagnosis not present

## 2023-04-13 DIAGNOSIS — Z20822 Contact with and (suspected) exposure to covid-19: Secondary | ICD-10-CM | POA: Diagnosis not present

## 2023-04-13 MED ORDER — SULFAMETHOXAZOLE-TRIMETHOPRIM 800-160 MG PO TABS
1.0000 | ORAL_TABLET | Freq: Two times a day (BID) | ORAL | Status: DC
  Administered 2023-04-13 – 2023-04-14 (×4): 1 via ORAL
  Filled 2023-04-13 (×5): qty 1

## 2023-04-13 MED ORDER — DIPHENHYDRAMINE HCL 25 MG PO CAPS
25.0000 mg | ORAL_CAPSULE | Freq: Four times a day (QID) | ORAL | Status: DC | PRN
  Administered 2023-04-13 – 2023-04-15 (×3): 25 mg via ORAL
  Filled 2023-04-13 (×3): qty 1

## 2023-04-13 NOTE — Plan of Care (Signed)
  Problem: Education: Goal: Knowledge of General Education information will improve Description: Including pain rating scale, medication(s)/side effects and non-pharmacologic comfort measures Outcome: Progressing   Problem: Health Behavior/Discharge Planning: Goal: Ability to manage health-related needs will improve Outcome: Progressing   Problem: Clinical Measurements: Goal: Ability to maintain clinical measurements within normal limits will improve Outcome: Progressing Goal: Will remain free from infection Outcome: Progressing Goal: Diagnostic test results will improve Outcome: Progressing Goal: Respiratory complications will improve Outcome: Progressing Goal: Cardiovascular complication will be avoided Outcome: Progressing   Problem: Activity: Goal: Risk for activity intolerance will decrease Outcome: Progressing   Problem: Nutrition: Goal: Adequate nutrition will be maintained Outcome: Progressing   Problem: Coping: Goal: Level of anxiety will decrease Outcome: Progressing   Problem: Elimination: Goal: Will not experience complications related to bowel motility Outcome: Progressing Goal: Will not experience complications related to urinary retention Outcome: Progressing   Problem: Pain Managment: Goal: General experience of comfort will improve and/or be controlled Outcome: Progressing   Problem: Safety: Goal: Ability to remain free from injury will improve Outcome: Progressing   Problem: Skin Integrity: Goal: Risk for impaired skin integrity will decrease Outcome: Progressing   Problem: Education: Goal: Knowledge of disease or condition will improve Outcome: Progressing Goal: Understanding of medication regimen will improve Outcome: Progressing Goal: Individualized Educational Video(s) Outcome: Progressing

## 2023-04-13 NOTE — Progress Notes (Signed)
 Progress Note    Dawn Roy   FMW:[id number]  DOB: 1929-09-11  DOA: 04/07/2023     5 PCP: Dawn Charm, MD  Initial CC: breast wound  Hospital Course: Dawn Roy is a 89 yo female with PMH breast cancer s/p bilateral mastectomy (L 1969, R 1977 followed by gel implant 1981), atrial fib, depression, HTN, osteoporosis, arthritis who presented with wounds on breasts worse on the right.  Concern was for infection/abscess on admission and plastic surgery was also consulted. She was recommended for biopsy given concern for recurrent cancer and was started on abx on admission in the meantime.  She was noted to be afebrile with no leukocytosis as well.   Interval History:  Still doing well. No events. Negative on covid swab and remains asymptomatic. Daughter at home still rather symptomatic and recovering and is also the main caretaker for patient. Might be prudent to extend hospitalization until daughter able to resume care (sounds like Wed or Thus for d/c) Biopsy results pending still.  Continues to do okay.   Assessment and Plan: * Cellulitis - recent CT chest done one 1/29 showed skin thickening along breasts bilaterally with no underlying abscess.  - evaluated by plastics as well and felt to have worsening ulcerations with concern for implant rupture but contained in the capsule - may be recurrent cancer; inflammatory in etiology given appearance on pics on admission -Biopsy obtained 04/09/2023.  Follow-up results; family request please do not discuss any biopsy results with patient without talking to daughter first - s/p 7 days meropenem ; no surgical need per plastic surgery and has continue to heal/respond to abx; - outpatient follow up with Dawn Roy week of 2/17 - de-escalate to Bactrim  and complete max 10 days as should be sufficient treatment  Contamination of blood culture - 1/2 bottles on admission noted with staph epi and patient afebrile with no leukocytosis -  discussed with ID as well; assumed to be contaminate given lack of sepsis criteria but assumed source would be breast  - for now continue abx as is - repeat blood cultures on 1/31; negative x 4 days  B12 deficiency - continue on B12 supplements IM while in hospital  - needs oral B12 at discharge   Close exposure to COVID-19 virus - Patient's daughter tested positive for COVID on 04/09/2023 and has been visiting with patient -COVID test performed on 04/10/2023 and negative -Continue monitoring for any development of symptoms  Depression - continue celexa   Persistent atrial fibrillation (HCC) - recently est with cardiology; seen on 12/10/22 for new onset afib - is on Toprol  and Eliquis   Primary hypertension - continue toprol    Old records reviewed in assessment of this patient  Antimicrobials: Meropenem  04/07/2023 >> 2/4 Bactrim  2/4 >> current   DVT prophylaxis:  Place and maintain sequential compression device Start: 04/08/23 1300 apixaban  (ELIQUIS ) tablet 5 mg   Code Status:   Code Status: Limited: Do not attempt resuscitation (DNR) -DNR-LIMITED -Do Not Intubate/DNI   Mobility Assessment (Last 72 Hours)     Mobility Assessment     Row Name 04/13/23 0736 04/12/23 2000 04/12/23 0800 04/11/23 2000 04/11/23 1029   Does patient have an order for bedrest or is patient medically unstable No - Continue assessment No - Continue assessment No - Continue assessment No - Continue assessment No - Continue assessment   What is the highest level of mobility based on the progressive mobility assessment? Level 5 (Walks with assist in room/hall) - Balance while stepping forward/back  and can walk in room with assist - Complete Level 5 (Walks with assist in room/hall) - Balance while stepping forward/back and can walk in room with assist - Complete Level 5 (Walks with assist in room/hall) - Balance while stepping forward/back and can walk in room with assist - Complete Level 5 (Walks with assist in  room/hall) - Balance while stepping forward/back and can walk in room with assist - Complete Level 5 (Walks with assist in room/hall) - Balance while stepping forward/back and can walk in room with assist - Complete    Row Name 04/10/23 1952           Does patient have an order for bedrest or is patient medically unstable No - Continue assessment       What is the highest level of mobility based on the progressive mobility assessment? Level 4 (Walks with assist in room) - Balance while marching in place and cannot step forward and back - Complete                Barriers to discharge: None Disposition Plan: Home HH orders placed: N/A Status is: Inpatient  Objective: Blood pressure (!) 156/67, pulse 70, temperature 97.8 F (36.6 C), temperature source Oral, resp. rate 16, height 5' 2 (1.575 m), weight 90.7 kg, SpO2 97%.  Examination:  Physical Exam Constitutional:      Appearance: Normal appearance.  HENT:     Head: Normocephalic and atraumatic.     Mouth/Throat:     Mouth: Mucous membranes are moist.  Eyes:     Extraocular Movements: Extraocular movements intact.  Cardiovascular:     Rate and Rhythm: Normal rate and regular rhythm.  Pulmonary:     Effort: Pulmonary effort is normal. No respiratory distress.     Breath sounds: Normal breath sounds. No wheezing.  Abdominal:     General: Bowel sounds are normal. There is no distension.     Palpations: Abdomen is soft.     Tenderness: There is no abdominal tenderness.  Musculoskeletal:        General: Normal range of motion.     Cervical back: Normal range of motion and neck supple.  Skin:    General: Skin is warm and dry.  Neurological:     General: No focal deficit present.     Mental Status: She is alert.  Psychiatric:        Mood and Affect: Mood normal.      Consultants:  Plastic surgery  Procedures:  04/09/2023: Bilateral breast biopsy  Data Reviewed: No results found for this or any previous visit  (from the past 24 hours).   I have reviewed pertinent nursing notes, vitals, labs, and images as necessary. I have ordered labwork to follow up on as indicated.  I have reviewed the last notes from staff over past 24 hours. I have discussed patient's care plan and test results with nursing staff, CM/SW, and other staff as appropriate.    LOS: 5 days   Alm Apo, MD [facility] 04/13/2023, 12:57 PM

## 2023-04-13 NOTE — TOC CM/SW Note (Addendum)
 No care identified at this time. Case Manager will continue to follow for home needs.

## 2023-04-13 NOTE — Progress Notes (Signed)
 Mobility Specialist Progress Note:    04/13/23 1559  Mobility  Activity Ambulated with assistance in hallway  Level of Assistance Moderate assist, patient does 50-74%  Assistive Device Front wheel walker  Distance Ambulated (ft) 100 ft  Activity Response Tolerated well  Mobility Referral Yes  Mobility visit 1 Mobility  Mobility Specialist Start Time (ACUTE ONLY) 1448  Mobility Specialist Stop Time (ACUTE ONLY) 1504  Mobility Specialist Time Calculation (min) (ACUTE ONLY) 16 min   Pt received in bed agreeable to mobility. Pt needed ModA for STS and MinG for ambulation. No c/o throughout. Returned to room w/o fault. Grand daughter assisted w/ bed change prior to patient returning to bed. Situated in bed w/ call bell and personal belongings. Grand daughter in room.  Thersia Minder Mobility Specialist  Please contact vis Secure Chat or  Rehab Office [phone]

## 2023-04-13 NOTE — Plan of Care (Signed)

## 2023-04-14 DIAGNOSIS — L0390 Cellulitis, unspecified: Secondary | ICD-10-CM | POA: Diagnosis not present

## 2023-04-14 DIAGNOSIS — E538 Deficiency of other specified B group vitamins: Secondary | ICD-10-CM | POA: Diagnosis not present

## 2023-04-14 DIAGNOSIS — I10 Essential (primary) hypertension: Secondary | ICD-10-CM

## 2023-04-14 DIAGNOSIS — F32A Depression, unspecified: Secondary | ICD-10-CM | POA: Diagnosis not present

## 2023-04-14 DIAGNOSIS — I4819 Other persistent atrial fibrillation: Secondary | ICD-10-CM

## 2023-04-14 DIAGNOSIS — Z20822 Contact with and (suspected) exposure to covid-19: Secondary | ICD-10-CM | POA: Diagnosis not present

## 2023-04-14 LAB — CULTURE, BLOOD (ROUTINE X 2)
Culture: NO GROWTH
Culture: NO GROWTH

## 2023-04-14 LAB — GLUCOSE, CAPILLARY: Glucose-Capillary: 82 mg/dL (ref 70–99)

## 2023-04-14 NOTE — Evaluation (Signed)
 Physical Therapy Evaluation Patient Details Name: Dawn Roy MRN: [id number] DOB: Jun 12, 1929 Today's Date: 04/14/2023  History of Present Illness  Pt is a 89 y.o. female who presented 04/07/23 with ulcerations of her breasts. Admitted with cellulitis of bil breasts without abscess. PMH includes breast cancer s/p chemotherapy and double mastectomy with breast implants in place, depression, HTN, vertigo, OP, abdominal hysterectomy, cholecystectomy.   Clinical Impression  Pt presents with condition above and deficits mentioned below, see PT Problem List. PTA, she was living with her daughter in a 1-level house with 2 STE and x1 stair to access the sun room (pt's favorite room of house). Pt is typically mod I utilizing her RW for all household mobility, but intermittently needs assist depending on how she is feeling. She also typically needs assist for car transfers and transfers out of short chairs in the community. Currently, the pt is demonstrating questionable memory deficits along with deficits in gross strength, balance, and activity tolerance/endurance. She is requiring CGA-minA for bed mobility and CGA for safety with transfers and gait bouts with a RW at this time. Provided pt and granddaughter with a HEP handout to try to improve pt's lower extremity strength while here in the hospital and at a home. The pt is at risk for falls. Recommending follow-up with HHPT. Will continue to follow acutely.      If plan is discharge home, recommend the following: A little help with walking and/or transfers;A little help with bathing/dressing/bathroom;Assistance with cooking/housework;Assist for transportation;Direct supervision/assist for medications management;Direct supervision/assist for financial management;Help with stairs or ramp for entrance   Can travel by private vehicle        Equipment Recommendations None recommended by PT  Recommendations for Other Services       Functional Status  Assessment Patient has had a recent decline in their functional status and demonstrates the ability to make significant improvements in function in a reasonable and predictable amount of time.     Precautions / Restrictions Precautions Precautions: Fall Restrictions Weight Bearing Restrictions Per Provider Order: No      Mobility  Bed Mobility Overal bed mobility: Needs Assistance Bed Mobility: Supine to Sit, Sit to Supine, Rolling Rolling: Contact guard assist, Used rails   Supine to sit: Contact guard, HOB elevated, Used rails Sit to supine: Min assist, HOB elevated, Used rails   General bed mobility comments: Extra time needed to ascend trunk to sit up L EOB using rails with HOB elevated, CGA for safety. Cues needed to flex leg to place foot on bed to push through to rotate trunk to assist in her reaching the rail to pull and roll. MinA needed at legs to lift and place back on bed with return to supine.    Transfers Overall transfer level: Needs assistance Equipment used: Rolling walker (2 wheels) Transfers: Sit to/from Stand Sit to Stand: Contact guard assist           General transfer comment: Attempted to cue pt to push up from bed with 1 UE for safety, but pt returned bil hands to the RW and pulled up to stand, RW tipped back a  little, but overall no LOB, CGA for safety    Ambulation/Gait Ambulation/Gait assistance: Contact guard assist Gait Distance (Feet): 90 Feet Assistive device: Rolling walker (2 wheels) Gait Pattern/deviations: Step-through pattern, Decreased stride length, Shuffle, Trunk flexed Gait velocity: reduced Gait velocity interpretation: <1.31 ft/sec, indicative of household ambulator   General Gait Details: Verbal and tactile cues provided to  improve upright posture, momentary success noted. No LOB, CGA for safety  Stairs            Wheelchair Mobility     Tilt Bed    Modified Rankin (Stroke Patients Only)       Balance Overall  balance assessment: Needs assistance Sitting-balance support: No upper extremity supported, Feet supported Sitting balance-Leahy Scale: Good Sitting balance - Comments: No LOB sitting EOB   Standing balance support: Bilateral upper extremity supported, During functional activity, No upper extremity supported Standing balance-Leahy Scale: Fair Standing balance comment: Able to stand at sink and brush her hair without UE support or LOB, reliant on RW to ambulate                             Pertinent Vitals/Pain Pain Assessment Pain Assessment: Faces Faces Pain Scale: No hurt Pain Intervention(s): Monitored during session    Home Living Family/patient expects to be discharged to:: Private residence Living Arrangements: Children (daughter) Available Help at Discharge: Family;Available 24 hours/day Type of Home: House Home Access: Stairs to enter Entrance Stairs-Rails: Can reach both Entrance Stairs-Number of Steps: 2   Home Layout: One level (x1 stair to access the sun room (pt's favorite room of house), has rails on either side but pt uses the L rail to ascend) Home Equipment: Grab bars - tub/shower;Grab bars - toilet;Rolling Walker (2 wheels);Wheelchair - power;Shower seat;Lift chair      Prior Function Prior Level of Function : Needs assist             Mobility Comments: Uses RW to mobilize, typically mod I for all household mobility, but intermittently needs assist depending on how she is feeling; typically needs assist for car transfers and transfers out of short chairs in the community       Extremity/Trunk Assessment   Upper Extremity Assessment Upper Extremity Assessment: Defer to OT evaluation    Lower Extremity Assessment Lower Extremity Assessment: Generalized weakness    Cervical / Trunk Assessment Cervical / Trunk Assessment: Kyphotic  Communication   Communication Communication: No apparent difficulties  Cognition Arousal: Alert Behavior  During Therapy: WFL for tasks assessed/performed Overall Cognitive Status: Within Functional Limits for tasks assessed                                 General Comments: Follows cues appropriately. Questionable memory though.        General Comments General comments (skin integrity, edema, etc.): provided pt and granddaughter with [facility] HEP handout with Access Code: [email or web address]    Exercises     Assessment/Plan    PT Assessment Patient needs continued PT services  PT Problem List Decreased strength;Decreased balance;Decreased activity tolerance;Decreased mobility;Decreased skin integrity       PT Treatment Interventions DME instruction;Gait training;Stair training;Functional mobility training;Therapeutic activities;Therapeutic exercise;Balance training;Neuromuscular re-education;Patient/family education    PT Goals (Current goals can be found in the Care Plan section)  Acute Rehab PT Goals Patient Stated Goal: to continue to improve PT Goal Formulation: With patient/family Time For Goal Achievement: 04/28/23 Potential to Achieve Goals: Good    Frequency Min 1X/week     Co-evaluation               AM-PAC PT 6 Clicks Mobility  Outcome Measure Help needed turning from your back to your side while in a flat bed without using bedrails?: A Little Help  needed moving from lying on your back to sitting on the side of a flat bed without using bedrails?: A Little Help needed moving to and from a bed to a chair (including a wheelchair)?: A Little Help needed standing up from a chair using your arms (e.g., wheelchair or bedside chair)?: A Little Help needed to walk in hospital room?: A Little Help needed climbing 3-5 steps with a railing? : A Little 6 Click Score: 18    End of Session Equipment Utilized During Treatment: Gait belt Activity Tolerance: Patient tolerated treatment well Patient left: in bed;with call bell/phone within reach;with bed alarm set;with  family/visitor present Nurse Communication: Mobility status PT Visit Diagnosis: Unsteadiness on feet (R26.81);Other abnormalities of gait and mobility (R26.89);Muscle weakness (generalized) (M62.81);Difficulty in walking, not elsewhere classified (R26.2)    Time: 8476-8455 PT Time Calculation (min) (ACUTE ONLY): 21 min   Charges:   PT Evaluation $PT Eval Low Complexity: 1 Low   PT General Charges $$ ACUTE PT VISIT: 1 Visit         Theo Ferretti, PT, DPT Acute Rehabilitation Services  Office: [phone]   Theo CHRISTELLA Ferretti 04/14/2023, 6:09 PM

## 2023-04-14 NOTE — Progress Notes (Signed)
 PROGRESS NOTE  Dawn Roy FMW:[id number] DOB: [birth year]   PCP: Elliot Charm, MD  Patient is from: Home.  Lives with daughter.  DOA: 04/07/2023 LOS: 6  Chief complaints Chief Complaint  Patient presents with   Breast Pain     Brief Narrative / Interim history: 89 year old F with PMH of  breast cancer s/p bilateral mastectomy (L 1969, R 1977 followed by gel implant 1981), atrial fib, depression, HTN, osteoporosis, arthritis who presented with wounds on breasts worse on the right, and admitted with working diagnosis of cellulitis.  CT chest without contrast showed skin thickening along the breasts bilaterally without underlying abscess, mild pulmonary edema and enlarged pulmonary trunk suggesting pulmonary hypertension.  Started on IV meropenem .  Plastic surgery consulted and patient underwent punch biopsies of bilateral breast tissue.   Patient improved with IV antibiotics.  Antibiotic discolored to Bactrim  on 2/4.  Biopsy results pending.  Subjective: Seen and examined earlier this morning.  No major events overnight of this morning.  Sleepy but wakes to voice.  She is fairly oriented.  She denies pain, shortness of breath, GI or UTI symptoms.  Objective: Vitals:   04/13/23 2015 04/14/23 0400 04/14/23 0449 04/14/23 0740  BP: 138/68  (!) 146/81 (!) 142/67  Pulse: 80  91 91  Resp: 18  (!) 22 16  Temp:   98.1 F (36.7 C) 98.3 F (36.8 C)  TempSrc:   Oral Oral  SpO2:  97% 95% 95%  Weight:      Height:        Examination:  GENERAL: No apparent distress.  Nontoxic. HEENT: MMM.  Vision and hearing grossly intact.  NECK: Supple.  No apparent JVD.  RESP:  No IWOB.  Fair aeration bilaterally. CVS: Irregular rhythm.  Normal rate.SABRA Heart sounds normal.  ABD/GI/GU: BS+. Abd soft, NTND.  MSK/EXT:  Moves extremities. No apparent deformity. No edema.  SKIN: See breast exam below.  No LAD or palpable mass in both armpits. NEURO: Sleepy but wakes to voice.  Oriented  appropriately.  No apparent focal neuro deficit. PSYCH: Calm. Normal affect.   Breast exam: S/p bilateral mastectomy.  Erythema in right breast with nickel size ulceration at the center.  No increased warmth to touch.  No erythema in left breast but some nickel sized ulceration.  See pictures for more.   Fabio Hendricks HERO, RN was present for chaperone during breast exam       Procedures:  1/31-punch biopsies of bilateral breast  Microbiology summarized: 1/29-COVID-19, influenza and RSV PCR nonreactive 1/29-blood culture with staph epidermis but only 1 bottle obtained. 1/31-repeat blood cultures NGTD  Assessment and plan: Cellulitis of bilateral breast without abscess-CT without drainable abscess.   Patient with history of bilateral breast cancer s/p remote bilateral mastectomy and implant. Cellulitis improved with IV antibiotics.  Underwent bilateral punch biopsy by plastic surgery.  She has been transitioned to oral antibiotics on 2/4.  -Follow-up pathology from punch biopsies.  Family requested not to discuss result with patient without notifying them.   -IV meropenem  1/29->> p.o. Bactrim  2/4 >>>.  Limited antibiotic choice due to extended allergies. -Continue wound care  -Outpatient follow up with Dr. Waddell week of 2/17   Positive blood culture: Likely contaminant.  Blood culture on 1/29 with staph epidermis but only 1 bottle obtained.  Repeat blood culture on 1/31 NGTD.  Persistent A-fib: Rate controlled. -Continue Toprol  and Eliquis  -Optimize electrolytes  Essential hypertension: BP slightly evaded. -Continue Toprol -XL   B12 deficiency: 178 on 1/31. -  Received B12 injection from 1/31 to 2/4. -P.o. vitamin B12 on discharge   Close exposure to COVID-19 virus: Daughter tested positive for 1/31.  Patient is negative on the 1/29 and 2/1.  No respiratory symptoms.   Depression: Stable -Continue celexa     Class II obesity Body mass index is 36.58 kg/m.          DVT  prophylaxis:  Place and maintain sequential compression device Start: 04/08/23 1300 apixaban  (ELIQUIS ) tablet 5 mg  Code Status: DNR/DNI Family Communication: None at bedside Level of care: Med-Surg Status is: Inpatient Remains inpatient appropriate because: Bilateral breast infection   Final disposition: Home Consultants:  Plastic surgery  55 minutes with more than 50% spent in reviewing records, counseling patient/family and coordinating care.   Sch Meds:  Scheduled Meds:  apixaban   5 mg Oral BID   citalopram   20 mg Oral Daily   gabapentin   100 mg Oral QPM   melatonin  6 mg Oral QHS   metoprolol  succinate  50 mg Oral Daily   sulfamethoxazole -trimethoprim   1 tablet Oral Q12H   Continuous Infusions: PRN Meds:.acetaminophen  **OR** acetaminophen , diphenhydrAMINE , furosemide   Antimicrobials: Anti-infectives (From admission, onward)    Start     Dose/Rate Route Frequency Ordered Stop   04/13/23 1000  sulfamethoxazole -trimethoprim  (BACTRIM  DS) 800-160 MG per tablet 1 tablet        1 tablet Oral Every 12 hours 04/13/23 0757     04/08/23 1000  meropenem  (MERREM ) 1 g in sodium chloride  0.9 % 100 mL IVPB  Status:  Discontinued        1 g 200 mL/hr over 30 Minutes Intravenous Every 12 hours 04/08/23 0827 04/13/23 0757   04/07/23 1415  meropenem  (MERREM ) 1 g in sodium chloride  0.9 % 100 mL IVPB        1 g 200 mL/hr over 30 Minutes Intravenous  Once 04/07/23 1403 04/07/23 1452        I have personally reviewed the following labs and images: CBC: Recent Labs  Lab 04/07/23 1337 04/08/23 0830 04/09/23 0439 04/10/23 0341  WBC 8.6 8.4 8.0 8.9  NEUTROABS 5.9 5.3  --  5.0  HGB 10.7* 10.6* 10.1* 10.5*  HCT 33.6* 33.6* 31.5* 32.6*  MCV 94.4 95.5 94.0 93.1  PLT 369 340 329 319   BMP &GFR Recent Labs  Lab 04/07/23 1337 04/08/23 0830 04/09/23 0439 04/10/23 0341  NA 137 137 134* 136  K 4.4 4.4 4.3 4.8  CL 99 103 101 101  CO2 33* 26 24 26   GLUCOSE 104* 85 82 93  BUN 21  19 18 22   CREATININE 1.25* 1.19* 1.05* 1.12*  CALCIUM 8.8* 8.6* 8.5* 8.6*  MG  --   --   --  2.1   Estimated Creatinine Clearance: 34.2 mL/min (A) (by C-G formula based on SCr of 1.12 mg/dL (H)). Liver & Pancreas: Recent Labs  Lab 04/07/23 1337  AST 39  ALT 32  ALKPHOS 73  BILITOT 0.6  PROT 6.7  ALBUMIN 3.0*   No results for input(s): LIPASE, AMYLASE in the last 168 hours. No results for input(s): AMMONIA in the last 168 hours. Diabetic: No results for input(s): HGBA1C in the last 72 hours. Recent Labs  Lab 04/14/23 0737  GLUCAP 82   Cardiac Enzymes: No results for input(s): CKTOTAL, CKMB, CKMBINDEX, TROPONINI in the last 168 hours. No results for input(s): PROBNP in the last 8760 hours. Coagulation Profile: No results for input(s): INR, PROTIME in the last 168 hours. Thyroid Function  Tests: No results for input(s): TSH, T4TOTAL, FREET4, T3FREE, THYROIDAB in the last 72 hours. Lipid Profile: No results for input(s): CHOL, HDL, LDLCALC, TRIG, CHOLHDL, LDLDIRECT in the last 72 hours. Anemia Panel: No results for input(s): VITAMINB12, FOLATE, FERRITIN, TIBC, IRON, RETICCTPCT in the last 72 hours. Urine analysis:    Component Value Date/Time   COLORURINE AMBER (A) 12/10/2022 0105   APPEARANCEUR HAZY (A) 12/10/2022 0105   LABSPEC 1.021 12/10/2022 0105   PHURINE 5.0 12/10/2022 0105   GLUCOSEU NEGATIVE 12/10/2022 0105   HGBUR NEGATIVE 12/10/2022 0105   BILIRUBINUR NEGATIVE 12/10/2022 0105   KETONESUR 5 (A) 12/10/2022 0105   PROTEINUR NEGATIVE 12/10/2022 0105   NITRITE NEGATIVE 12/10/2022 0105   LEUKOCYTESUR NEGATIVE 12/10/2022 0105   Sepsis Labs: Invalid input(s): PROCALCITONIN, LACTICIDVEN  Microbiology: Recent Results (from the past 240 hours)  Blood culture (routine x 2)     Status: Abnormal   Collection Time: 04/07/23  1:37 PM   Specimen: BLOOD  Result Value Ref Range Status   Specimen Description    Final    BLOOD RIGHT ANTECUBITAL Performed at [facility], [address] [place], [place] [address]    Special Requests   Final    Blood Culture adequate volume BOTTLES DRAWN AEROBIC AND ANAEROBIC Performed at [facility], [address], [place], [place] [address]    Culture  Setup Time   Final    GRAM POSITIVE COCCI ANAEROBIC BOTTLE ONLY CRITICAL RESULT CALLED TO, READ BACK BY AND VERIFIED WITH: J [facility]  04/09/23 MK    Culture (A)  Final    STAPHYLOCOCCUS EPIDERMIDIS THE SIGNIFICANCE OF ISOLATING THIS ORGANISM FROM A SINGLE VENIPUNCTURE CANNOT BE PREDICTED WITHOUT FURTHER CLINICAL AND CULTURE CORRELATION. SUSCEPTIBILITIES AVAILABLE ONLY ON REQUEST. Performed at [facility], [address], [place], [place] [address]    Report Status 04/10/2023 FINAL  Final  Resp panel by RT-PCR (RSV, Flu A&B, Covid) Anterior Nasal Swab     Status: None   Collection Time: 04/07/23  1:37 PM   Specimen: Anterior Nasal Swab  Result Value Ref Range Status   SARS Coronavirus 2 by RT PCR NEGATIVE NEGATIVE Final    Comment: (NOTE) SARS-CoV-2 target nucleic acids are NOT DETECTED.  The SARS-CoV-2 RNA is generally detectable in upper respiratory specimens during the acute phase of infection. The lowest concentration of SARS-CoV-2 viral copies this assay can detect is 138 copies/mL. A negative result does not preclude SARS-Cov-2 infection and should not be used as the sole basis for treatment or other patient management decisions. A negative result may occur with  improper specimen collection/handling, submission of specimen other than nasopharyngeal swab, presence of viral mutation(s) within the areas targeted by this assay, and inadequate number of viral copies(<138 copies/mL). A negative result must be combined with clinical observations, patient history, and epidemiological information. The expected result is Negative.  Fact Sheet for  Patients:  bloggercourse.com  Fact Sheet for Healthcare Providers:  seriousbroker.it  This test is no t yet approved or cleared by the [place]  FDA and  has been authorized for detection and/or diagnosis of SARS-CoV-2 by FDA under an Emergency Use Authorization (EUA). This EUA will remain  in effect (meaning this test can be used) for the duration of the COVID-19 declaration under Section 564(b)(1) of the Act, 21 U.S.C.section 360bbb-3(b)(1), unless the authorization is terminated  or revoked sooner.       Influenza A by PCR NEGATIVE NEGATIVE Final   Influenza B by PCR NEGATIVE  NEGATIVE Final    Comment: (NOTE) The Xpert Xpress SARS-CoV-2/FLU/RSV plus assay is intended as an aid in the diagnosis of influenza from Nasopharyngeal swab specimens and should not be used as a sole basis for treatment. Nasal washings and aspirates are unacceptable for Xpert Xpress SARS-CoV-2/FLU/RSV testing.  Fact Sheet for Patients: bloggercourse.com  Fact Sheet for Healthcare Providers: seriousbroker.it  This test is not yet approved or cleared by the [place]  FDA and has been authorized for detection and/or diagnosis of SARS-CoV-2 by FDA under an Emergency Use Authorization (EUA). This EUA will remain in effect (meaning this test can be used) for the duration of the COVID-19 declaration under Section 564(b)(1) of the Act, 21 U.S.C. section 360bbb-3(b)(1), unless the authorization is terminated or revoked.     Resp Syncytial Virus by PCR NEGATIVE NEGATIVE Final    Comment: (NOTE) Fact Sheet for Patients: bloggercourse.com  Fact Sheet for Healthcare Providers: seriousbroker.it  This test is not yet approved or cleared by the [place]  FDA and has been authorized for detection and/or diagnosis of SARS-CoV-2 by FDA under an Emergency  Use Authorization (EUA). This EUA will remain in effect (meaning this test can be used) for the duration of the COVID-19 declaration under Section 564(b)(1) of the Act, 21 U.S.C. section 360bbb-3(b)(1), unless the authorization is terminated or revoked.  Performed at [facility], [address] [place], [place] [address]   Blood Culture ID Panel (Reflexed)     Status: Abnormal   Collection Time: 04/07/23  1:37 PM  Result Value Ref Range Status   Enterococcus faecalis NOT DETECTED NOT DETECTED Final   Enterococcus Faecium NOT DETECTED NOT DETECTED Final   Listeria monocytogenes NOT DETECTED NOT DETECTED Final   Staphylococcus species DETECTED (A) NOT DETECTED Final    Comment: CRITICAL RESULT CALLED TO, READ BACK BY AND VERIFIED WITH: J WYLAND,PHARMD@[email or web address]  04/09/23 MK    Staphylococcus aureus (BCID) NOT DETECTED NOT DETECTED Final   Staphylococcus epidermidis DETECTED (A) NOT DETECTED Final    Comment: CRITICAL RESULT CALLED TO, READ BACK BY AND VERIFIED WITH: J WYLAND,PHARMD@[email or web address]  04/09/23 MK    Staphylococcus lugdunensis NOT DETECTED NOT DETECTED Final   Streptococcus species NOT DETECTED NOT DETECTED Final   Streptococcus agalactiae NOT DETECTED NOT DETECTED Final   Streptococcus pneumoniae NOT DETECTED NOT DETECTED Final   Streptococcus pyogenes NOT DETECTED NOT DETECTED Final   A.calcoaceticus-baumannii NOT DETECTED NOT DETECTED Final   Bacteroides fragilis NOT DETECTED NOT DETECTED Final   Enterobacterales NOT DETECTED NOT DETECTED Final   Enterobacter cloacae complex NOT DETECTED NOT DETECTED Final   Escherichia coli NOT DETECTED NOT DETECTED Final   Klebsiella aerogenes NOT DETECTED NOT DETECTED Final   Klebsiella oxytoca NOT DETECTED NOT DETECTED Final   Klebsiella pneumoniae NOT DETECTED NOT DETECTED Final   Proteus species NOT DETECTED NOT DETECTED Final   Salmonella species NOT DETECTED NOT DETECTED Final   Serratia marcescens NOT DETECTED NOT  DETECTED Final   Haemophilus influenzae NOT DETECTED NOT DETECTED Final   Neisseria meningitidis NOT DETECTED NOT DETECTED Final   Pseudomonas aeruginosa NOT DETECTED NOT DETECTED Final   Stenotrophomonas maltophilia NOT DETECTED NOT DETECTED Final   Candida albicans NOT DETECTED NOT DETECTED Final   Candida auris NOT DETECTED NOT DETECTED Final   Candida glabrata NOT DETECTED NOT DETECTED Final   Candida krusei NOT DETECTED NOT DETECTED Final   Candida parapsilosis NOT DETECTED NOT DETECTED Final   Candida tropicalis NOT DETECTED NOT DETECTED Final   Cryptococcus neoformans/gattii NOT  DETECTED NOT DETECTED Final   Methicillin resistance mecA/C NOT DETECTED NOT DETECTED Final    Comment: Performed at [facility], [address], [place], [place] [address]  Culture, blood (Routine X 2) w Reflex to ID Panel     Status: None   Collection Time: 04/09/23  8:58 AM   Specimen: BLOOD LEFT ARM  Result Value Ref Range Status   Specimen Description BLOOD LEFT ARM  Final   Special Requests   Final    BOTTLES DRAWN AEROBIC AND ANAEROBIC Blood Culture results may not be optimal due to an inadequate volume of blood received in culture bottles   Culture   Final    NO GROWTH 5 DAYS Performed at [facility], [address], [place], [place] [address]    Report Status 04/14/2023 FINAL  Final  Culture, blood (Routine X 2) w Reflex to ID Panel     Status: None   Collection Time: 04/09/23  9:02 AM   Specimen: BLOOD LEFT HAND  Result Value Ref Range Status   Specimen Description BLOOD LEFT HAND  Final   Special Requests   Final    BOTTLES DRAWN AEROBIC AND ANAEROBIC Blood Culture results may not be optimal due to an inadequate volume of blood received in culture bottles   Culture   Final    NO GROWTH 5 DAYS Performed at [facility], [address] [place], [place] [address]    Report Status 04/14/2023 FINAL  Final  SARS Coronavirus 2 by RT PCR (hospital order, performed in [facility] hospital lab) *cepheid single result test* Anterior Nasal Swab     Status: None   Collection Time: 04/10/23 11:28 AM   Specimen: Anterior Nasal Swab  Result Value Ref Range Status   SARS Coronavirus 2 by RT PCR NEGATIVE NEGATIVE Final    Comment: Performed at [facility], [address] [place], [place] [address]    Radiology Studies: No results found.    Lamir Racca T. Siyah Mault [facility]  If 7PM-7AM, please contact night-coverage [email or web address] 04/14/2023, 11:43 AM

## 2023-04-14 NOTE — Plan of Care (Signed)
  Problem: Education: Goal: Knowledge of General Education information will improve Description: Including pain rating scale, medication(s)/side effects and non-pharmacologic comfort measures Outcome: Progressing   Problem: Health Behavior/Discharge Planning: Goal: Ability to manage health-related needs will improve Outcome: Progressing   Problem: Clinical Measurements: Goal: Ability to maintain clinical measurements within normal limits will improve Outcome: Progressing Goal: Will remain free from infection Outcome: Progressing Goal: Diagnostic test results will improve Outcome: Progressing Goal: Respiratory complications will improve Outcome: Progressing Goal: Cardiovascular complication will be avoided Outcome: Progressing   Problem: Activity: Goal: Risk for activity intolerance will decrease Outcome: Progressing   Problem: Nutrition: Goal: Adequate nutrition will be maintained Outcome: Progressing   Problem: Coping: Goal: Level of anxiety will decrease Outcome: Progressing   Problem: Elimination: Goal: Will not experience complications related to bowel motility Outcome: Progressing Goal: Will not experience complications related to urinary retention Outcome: Progressing   Problem: Pain Managment: Goal: General experience of comfort will improve and/or be controlled Outcome: Progressing   Problem: Safety: Goal: Ability to remain free from injury will improve Outcome: Progressing   Problem: Skin Integrity: Goal: Risk for impaired skin integrity will decrease Outcome: Progressing   Problem: Education: Goal: Knowledge of disease or condition will improve Outcome: Progressing Goal: Understanding of medication regimen will improve Outcome: Progressing Goal: Individualized Educational Video(s) Outcome: Progressing   Problem: Cardiac: Goal: Ability to achieve and maintain adequate cardiopulmonary perfusion will improve Outcome: Progressing   Problem: Health  Behavior/Discharge Planning: Goal: Ability to safely manage health-related needs after discharge will improve Outcome: Progressing

## 2023-04-14 NOTE — Progress Notes (Signed)
 Mobility Specialist Progress Note;   04/14/23 1040  Mobility  Activity Ambulated with assistance in hallway  Level of Assistance Minimal assist, patient does 75% or more  Assistive Device Front wheel walker  Distance Ambulated (ft) 100 ft  Activity Response Tolerated well  Mobility Referral Yes  Mobility visit 1 Mobility  Mobility Specialist Start Time (ACUTE ONLY) 1040  Mobility Specialist Stop Time (ACUTE ONLY) 1057  Mobility Specialist Time Calculation (min) (ACUTE ONLY) 17 min   Pt agreeable to mobility. Required MinA to stand from elevated bed and during ambulation for safety. VSS throughout and no c/o during session. Granddaughter states she seems to be getting stronger. Pt returned back to bed with all needs met. NT in room.   Lauraine Erm Mobility Specialist Please contact via [employer] or [employer] [phone]

## 2023-04-14 NOTE — Plan of Care (Signed)
Updated patient's daughter over the phone. 

## 2023-04-14 NOTE — Progress Notes (Signed)
 Report called to Kerstin, RN on [address].

## 2023-04-15 DIAGNOSIS — F32A Depression, unspecified: Secondary | ICD-10-CM | POA: Diagnosis not present

## 2023-04-15 DIAGNOSIS — E538 Deficiency of other specified B group vitamins: Secondary | ICD-10-CM | POA: Diagnosis not present

## 2023-04-15 DIAGNOSIS — L0390 Cellulitis, unspecified: Secondary | ICD-10-CM | POA: Diagnosis not present

## 2023-04-15 DIAGNOSIS — Z20822 Contact with and (suspected) exposure to covid-19: Secondary | ICD-10-CM | POA: Diagnosis not present

## 2023-04-15 LAB — RENAL FUNCTION PANEL
Albumin: 2.3 g/dL — ABNORMAL LOW (ref 3.5–5.0)
Anion gap: 8 (ref 5–15)
BUN: 21 mg/dL (ref 8–23)
CO2: 26 mmol/L (ref 22–32)
Calcium: 8.3 mg/dL — ABNORMAL LOW (ref 8.9–10.3)
Chloride: 100 mmol/L (ref 98–111)
Creatinine, Ser: 1.4 mg/dL — ABNORMAL HIGH (ref 0.44–1.00)
GFR, Estimated: 36 mL/min — ABNORMAL LOW
Glucose, Bld: 76 mg/dL (ref 70–99)
Phosphorus: 4 mg/dL (ref 2.5–4.6)
Potassium: 4.5 mmol/L (ref 3.5–5.1)
Sodium: 134 mmol/L — ABNORMAL LOW (ref 135–145)

## 2023-04-15 LAB — CBC
HCT: 32.3 % — ABNORMAL LOW (ref 36.0–46.0)
Hemoglobin: 10.5 g/dL — ABNORMAL LOW (ref 12.0–15.0)
MCH: 29.8 pg (ref 26.0–34.0)
MCHC: 32.5 g/dL (ref 30.0–36.0)
MCV: 91.8 fL (ref 80.0–100.0)
Platelets: 241 10*3/uL (ref 150–400)
RBC: 3.52 MIL/uL — ABNORMAL LOW (ref 3.87–5.11)
RDW: 13.4 % (ref 11.5–15.5)
WBC: 4.3 10*3/uL (ref 4.0–10.5)
nRBC: 0 % (ref 0.0–0.2)

## 2023-04-15 LAB — MAGNESIUM: Magnesium: 2.1 mg/dL (ref 1.7–2.4)

## 2023-04-15 MED ORDER — METOPROLOL SUCCINATE ER 25 MG PO TB24
25.0000 mg | ORAL_TABLET | Freq: Every day | ORAL | Status: DC
  Administered 2023-04-16: 25 mg via ORAL
  Filled 2023-04-15: qty 1

## 2023-04-15 MED ORDER — CEFADROXIL 500 MG PO CAPS
500.0000 mg | ORAL_CAPSULE | Freq: Every day | ORAL | Status: DC
Start: 2023-04-15 — End: 2023-04-15
  Administered 2023-04-15: 500 mg via ORAL
  Filled 2023-04-15 (×2): qty 1

## 2023-04-15 MED ORDER — SULFAMETHOXAZOLE-TRIMETHOPRIM 800-160 MG PO TABS: 1.0000 | ORAL_TABLET | Freq: Two times a day (BID) | ORAL | Status: DC

## 2023-04-15 MED ORDER — SODIUM CHLORIDE 0.9 % IV BOLUS
500.0000 mL | Freq: Once | INTRAVENOUS | Status: AC
  Administered 2023-04-15: 500 mL via INTRAVENOUS

## 2023-04-15 MED ORDER — HYDROCORTISONE 1 % EX OINT
TOPICAL_OINTMENT | Freq: Two times a day (BID) | CUTANEOUS | Status: DC
  Filled 2023-04-15: qty 28

## 2023-04-15 NOTE — Evaluation (Signed)
 Occupational Therapy Evaluation Patient Details Name: Dawn Roy MRN: [id number] DOB: [birth year] Today's Date: 04/15/2023   History of Present Illness Pt is a 89 y.o. female who presented 04/07/23 with ulcerations of her breasts. Admitted with cellulitis of bil breasts without abscess. PMH includes breast cancer s/p chemotherapy and double mastectomy with breast implants in place, depression, HTN, vertigo, OP, abdominal hysterectomy, cholecystectomy.   Clinical Impression   Pt doing well, c/o no pain or discomfort, eager to return home, granddaughter present during session. Pt mod I at baseline, lives with daughter who is there 24/7, daughter assists with IADLs, bathing. Pt close to baseline at this time, set up for ADLs, CGA for mobility, overall doing well. Pt and granddaughter feel good about going home, state they have all DME and support needed to remain safe/independent. Granddaughter will be assisting as well as needed. No further acute OT or follow up needs.       If plan is discharge home, recommend the following: A little help with walking and/or transfers;A little help with bathing/dressing/bathroom;Assistance with cooking/housework;Assist for transportation;Help with stairs or ramp for entrance    Functional Status Assessment  Patient has had a recent decline in their functional status and demonstrates the ability to make significant improvements in function in a reasonable and predictable amount of time.  Equipment Recommendations  None recommended by OT    Recommendations for Other Services       Precautions / Restrictions Precautions Precautions: Fall Restrictions Weight Bearing Restrictions Per Provider Order: No      Mobility Bed Mobility Overal bed mobility: Needs Assistance Bed Mobility: Supine to Sit, Sit to Supine     Supine to sit: Min assist, HOB elevated Sit to supine: Min assist, HOB elevated, Used rails   General bed mobility comments: increased  time, assist for BLEs in/out of bed.    Transfers Overall transfer level: Needs assistance Equipment used: Rolling walker (2 wheels) Transfers: Sit to/from Stand, Bed to chair/wheelchair/BSC Sit to Stand: Contact guard assist     Step pivot transfers: Contact guard assist     General transfer comment: CGA with RW      Balance Overall balance assessment: Needs assistance Sitting-balance support: No upper extremity supported, Feet supported Sitting balance-Leahy Scale: Good Sitting balance - Comments: No LOB sitting EOB   Standing balance support: Bilateral upper extremity supported, During functional activity, No upper extremity supported Standing balance-Leahy Scale: Fair Standing balance comment: Able to stand at sink and brush her hair without UE support or LOB, reliant on RW to ambulate                           ADL either performed or assessed with clinical judgement   ADL Overall ADL's : At baseline;Modified independent                                       General ADL Comments: Pt close to baseline, set up for ADLs, CGA for transfers/mobility, some cueing for safety     Vision Baseline Vision/History: 1 Wears glasses Ability to See in Adequate Light: 0 Adequate Patient Visual Report: No change from baseline       Perception         Praxis         Pertinent Vitals/Pain Pain Assessment Pain Assessment: No/denies pain     Extremity/Trunk Assessment  Upper Extremity Assessment Upper Extremity Assessment: Overall WFL for tasks assessed   Lower Extremity Assessment Lower Extremity Assessment: Defer to PT evaluation       Communication Communication Communication: Hearing impairment   Cognition Arousal: Alert Behavior During Therapy: [facility] for tasks assessed/performed Overall Cognitive Status: Within Functional Limits for tasks assessed                                 General Comments: appears HOH.      General Comments  BP 86/46 supine, 93/53 sitting, 86/53 standing.    Exercises     Shoulder Instructions      Home Living Family/patient expects to be discharged to:: Private residence Living Arrangements: Children Available Help at Discharge: Family;Available 24 hours/day Type of Home: House Home Access: Stairs to enter [employer] of Steps: 2 Entrance Stairs-Rails: Can reach both Home Layout: One level     Bathroom Shower/Tub: Producer, Television/film/video: Handicapped height     Home Equipment: Grab bars - tub/shower;Grab bars - toilet;Rolling Walker (2 wheels);Wheelchair - power;Shower seat;Lift chair   Additional Comments: Pt lives with daughter who is caregiver, available 24/7. Pt's granddaughter will be assisting as needed as well.      Prior Functioning/Environment Prior Level of Function : Needs assist             Mobility Comments: Uses RW to mobilize, typically mod I for all household mobility, but intermittently needs assist depending on how she is feeling; typically needs assist for car transfers and transfers out of short chairs in the community ADLs Comments: assist for bathing and meals        OT Problem List: Decreased activity tolerance;Impaired balance (sitting and/or standing);Decreased strength      OT Treatment/Interventions:      OT Goals(Current goals can be found in the care plan section) Acute Rehab OT Goals Patient Stated Goal: to return home OT Goal Formulation: With patient/family Time For Goal Achievement: 04/29/23 Potential to Achieve Goals: Good  OT Frequency:      Co-evaluation              AM-PAC OT 6 Clicks Daily Activity     Outcome Measure Help from another person eating meals?: None Help from another person taking care of personal grooming?: A Little Help from another person toileting, which includes using toliet, bedpan, or urinal?: A Little Help from another person bathing (including washing,  rinsing, drying)?: A Little Help from another person to put on and taking off regular upper body clothing?: A Little Help from another person to put on and taking off regular lower body clothing?: A Little 6 Click Score: 19   End of Session Equipment Utilized During Treatment: Gait belt;Rolling walker (2 wheels) Nurse Communication: Mobility status  Activity Tolerance: Patient tolerated treatment well Patient left: in chair;with call bell/phone within reach;with family/visitor present  OT Visit Diagnosis: Muscle weakness (generalized) (M62.81)                Time: 9082-9055 OT Time Calculation (min): 27 min Charges:  OT General Charges $OT Visit: 1 Visit OT Evaluation $OT Eval Low Complexity: 1 Low OT Treatments $Self Care/Home Management : 8-22 mins  Dhiya Smits, OTR/L   Elouise JONELLE Bott 04/15/2023, 9:50 AM

## 2023-04-15 NOTE — Plan of Care (Signed)
  Problem: Clinical Measurements: Goal: Will remain free from infection Outcome: Progressing   Problem: Clinical Measurements: Goal: Respiratory complications will improve Outcome: Progressing   Problem: Activity: Goal: Risk for activity intolerance will decrease Outcome: Progressing   Problem: Nutrition: Goal: Adequate nutrition will be maintained Outcome: Progressing   Problem: Elimination: Goal: Will not experience complications related to bowel motility Outcome: Progressing   Problem: Pain Managment: Goal: General experience of comfort will improve and/or be controlled Outcome: Progressing

## 2023-04-15 NOTE — Progress Notes (Signed)
 Mobility Specialist Progress Note:   04/15/23 1430  Mobility  Activity Transferred to/from [facility]  Level of Assistance Contact guard assist, steadying assist  Assistive Device Front wheel walker  Distance Ambulated (ft) 3 ft  Activity Response Tolerated well  Mobility Referral Yes  Mobility visit 1 Mobility  Mobility Specialist Start Time (ACUTE ONLY) 1425  Mobility Specialist Stop Time (ACUTE ONLY) 1430  Mobility Specialist Time Calculation (min) (ACUTE ONLY) 5 min   Pt received in chair, requesting assistance to [facility]. CG to stand and pivot from chair. No complaints stated. Pt left on [facility] with family and NT present in room.   Brown Husband  Mobility Specialist Please contact via [employer] office at [phone]

## 2023-04-15 NOTE — TOC Initial Note (Addendum)
 Transition of Care (TOC) - Initial/Assessment Note   Spoke to patient and grand daughter a bedside.   Discussed orders for home health PT,OT, and RN. Explained HHRN would not be able to make daily visits ( insurance) family would be taught wound care prior to patient being discharged from the hospital. NCM provided medicare.gov list of home health agencies with ratings. Patient has had [facility] in the past. They will discuss home health with patient's daughter.   NCM will follow up later for decision.   Patient from home with daughter has DME .   1400 Checked back with patient and daughter. They have not made a decision on home health agency yet. NCM will continue to follow.  Patient Details  Name: Dawn Roy MRN: [id number] Date of Birth: 1929-08-12  Transition of Care [facility]) CM/SW Contact:    Stephane Powell Jansky, RN Phone Number: 04/15/2023, 10:28 AM  Clinical Narrative:                   Expected Discharge Plan: Home w Home Health Services Barriers to Discharge: Continued Medical Work up   Patient Goals and CMS Choice Patient states their goals for this hospitalization and ongoing recovery are:: to return to home CMS Medicare.gov Compare Post Acute Care list provided to:: Patient Choice offered to / list presented to : Patient      Expected Discharge Plan and Services In-house Referral: NA Discharge Planning Services: CM Consult Post Acute Care Choice: Home Health Living arrangements for the past 2 months: Single Family Home                 DME Arranged: N/A DME Agency: NA       HH Arranged:  (see note)          Prior Living Arrangements/Services Living arrangements for the past 2 months: Single Family Home Lives with:: Adult Children Patient language and need for interpreter reviewed:: Yes Do you feel safe going back to the place where you live?: Yes      Need for Family Participation in Patient Care: Yes (Comment) Care giver support system in place?:  Yes (comment) Current home services: DME Criminal Activity/Legal Involvement Pertinent to Current Situation/Hospitalization: No - Comment as needed  Activities of Daily Living   ADL Screening (condition at time of admission) Independently performs ADLs?: Yes (appropriate for developmental age) Is the patient deaf or have difficulty hearing?: Yes Does the patient have difficulty seeing, even when wearing glasses/contacts?: No Does the patient have difficulty concentrating, remembering, or making decisions?: No  Permission Sought/Granted Permission sought to share information with : Case Manager Permission granted to share information with : Yes, Verbal Permission Granted  Share Information with NAME: grand daughter Benedict           Emotional Assessment Appearance:: Appears stated age Attitude/Demeanor/Rapport: Engaged Affect (typically observed): Accepting Orientation: : Oriented to Self, Oriented to Place, Oriented to  Time, Oriented to Situation Alcohol / Substance Use: Not Applicable Psych Involvement: No (comment)  Admission diagnosis:  Cellulitis of chest wall [L03.313] Cellulitis [L03.90] Complication of breast implant [T85.9XXA] Skin ulcer, unspecified ulcer stage [facility]) [L98.499] Patient Active Problem List   Diagnosis Date Noted   Close exposure to COVID-19 virus 04/10/2023   Contamination of blood culture 04/09/2023   Depression 04/09/2023   B12 deficiency 04/09/2023   Cellulitis 04/07/2023   Persistent atrial fibrillation (HCC) 12/28/2022   Hypercoagulable state due to persistent atrial fibrillation (HCC) 12/28/2022   Primary hypertension 12/10/2022  Atrial fibrillation with rapid ventricular response (HCC) 12/09/2022   PCP:  Elliot Charm, MD Pharmacy:   [facility] [id number], [place] - [address] AT [facility] [address] [place] [address] Phone: [phone] Fax: [phone]  [facility] [address] [place] [address] Phone: [phone] Fax: [phone]  [facility] [address] [place] [address] Phone: [phone] Fax: [phone]     Social Drivers of Health (SDOH) Social History: SDOH Screenings   Food Insecurity: No Food Insecurity (04/08/2023)  Housing: Low Risk  (04/08/2023)  Transportation Needs: No Transportation Needs (04/08/2023)  Utilities: Not At Risk (04/08/2023)  Social Connections: Moderately Isolated (04/08/2023)  Tobacco Use: Low Risk  (04/07/2023)   SDOH Interventions:     Readmission Risk Interventions     No data to display

## 2023-04-15 NOTE — Progress Notes (Signed)
 PROGRESS NOTE  Dawn Roy FMW:[id number] DOB: February 07, 1930   PCP: Elliot Charm, MD  Patient is from: Home.  Lives with daughter.  DOA: 04/07/2023 LOS: 7  Chief complaints Chief Complaint  Patient presents with   Breast Pain     Brief Narrative / Interim history: 89 year old F with PMH of  breast cancer s/p bilateral mastectomy (L 1969, R 1977 followed by gel implant 1981), atrial fib, depression, HTN, osteoporosis, arthritis who presented with wounds on breasts worse on the right, and admitted with working diagnosis of cellulitis.  CT chest without contrast showed skin thickening along the breasts bilaterally without underlying abscess, mild pulmonary edema and enlarged pulmonary trunk suggesting pulmonary hypertension.  Started on IV meropenem .  Plastic surgery consulted and patient underwent punch biopsies of bilateral breast tissue.   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  No malignancy.  Patient improved with IV antibiotics.  Antibiotic de-escalated to Bactrim  on 2/4 and changed to cefadroxil  on 2/6.   Likely discharge home on 2/7 if hypotension and renal function improve.  Subjective: Seen and examined earlier this morning.  No major events overnight of this morning.  No complaints.  Reports feeling well.  Slightly hypotensive but not symptomatic.  Granddaughter at bedside.  Objective: Vitals:   04/15/23 0512 04/15/23 0938 04/15/23 1100 04/15/23 1151  BP: 127/64 (!) 87/53 (!) 97/54 (!) 97/54  Pulse: 83 84 82 82  Resp:  20    Temp: 97.8 F (36.6 C) 97.8 F (36.6 C)    TempSrc: Oral Oral    SpO2: 97% 98%    Weight:      Height:        Examination:  GENERAL: No apparent distress.  Nontoxic. HEENT: MMM.  Vision and hearing grossly intact.  NECK: Supple.  No apparent JVD.  RESP:  No IWOB.  Fair aeration bilaterally. CVS: Irregular rhythm.  Normal rate.SABRA Heart sounds normal.  ABD/GI/GU: BS+. Abd soft,  NTND.  MSK/EXT:  Moves extremities. No apparent deformity. No edema.  SKIN: Deferred breast exam today NEURO: Sleepy but wakes to voice.  Oriented appropriately.  No apparent focal neuro deficit. PSYCH: Calm. Normal affect.   Breast exam: Has new dressing over bilateral breast today.  Procedures:  1/31-punch biopsies of bilateral breast.  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  Microbiology summarized: 1/29-COVID-19, influenza and RSV PCR nonreactive 1/29-blood culture with staph epidermis but only 1 bottle obtained. 1/31-repeat blood cultures NGTD  Assessment and plan: Cellulitis of bilateral breast without abscess-CT without drainable abscess.   Patient with history of bilateral breast cancer s/p remote bilateral mastectomy and implant. Cellulitis improved with IV antibiotics.  Underwent bilateral punch biopsy by plastic surgery.  She has been transitioned to oral antibiotics on 2/4.  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  No malignancy -IV meropenem  1/29>> p.o. Bactrim  2/4-2/6.  Bactrim  not a good choice with AKI.  Noted history of rash with penicillin and cephalosporin but she does not recall.  Challenging with p.o. cefadroxil  while in house.  Discussed with daughter and granddaughter. -Continue wound care  -Outpatient follow up with Dr. Waddell week of 2/17   Positive blood culture: Likely contaminant.  Blood culture on 1/29 with staph epidermis but only 1 bottle obtained.  Repeat blood culture on 1/31 NGTD.  Persistent A-fib: Rate controlled. -Continue Eliquis  -Decrease Toprol -XL given hypotension. -Optimize electrolytes  Hypotension/history of essential hypertension: Hypotensive this morning but not symptomatic.  Not orthostatic  either. -Decreased metoprolol  and added holding parameters. -Discontinue Lasix  -NS bolus 500 cc.  CKD-3A: Baseline Cr about 1.1.  Creatinine  slightly up.  Probably due to hypotension, Lasix  and Bactrim . Recent Labs    12/09/22 1515 12/10/22 0413 12/11/22 0543 12/28/22 1357 04/07/23 1337 04/08/23 0830 04/09/23 0439 04/10/23 0341 04/15/23 0639  BUN 19 23 22 16 21 19 18 22 21   CREATININE 1.20* 1.32* 1.41* 1.14* 1.25* 1.19* 1.05* 1.12* 1.40*  -Discontinue Lasix  and Bactrim  -Decreased metoprolol  -NS bolus 500 cc -Check in the morning  B12 deficiency: 178 on 1/31. -Received B12 injection from 1/31 to 2/4. -P.o. vitamin B12 on discharge   Close exposure to COVID-19 virus: Daughter tested positive for 1/31.  Patient is negative on the 1/29 and 2/1.  No respiratory symptoms.   Depression: Stable -Continue celexa    Class II obesity Body mass index is 36.58 kg/m.          DVT prophylaxis:  Place and maintain sequential compression device Start: 04/08/23 1300 apixaban  (ELIQUIS ) tablet 5 mg  Code Status: DNR/DNI Family Communication: Updated patient's granddaughter at bedside and patient's daughter over the phone Level of care: Med-Surg Status is: Inpatient Remains inpatient appropriate because: Bilateral breast infection   Final disposition: Home Consultants:  Plastic surgery  55 minutes with more than 50% spent in reviewing records, counseling patient/family and coordinating care.   Sch Meds:  Scheduled Meds:  apixaban   5 mg Oral BID   cefadroxil   500 mg Oral Daily   citalopram   20 mg Oral Daily   gabapentin   100 mg Oral QPM   melatonin  6 mg Oral QHS   [START ON 04/16/2023] metoprolol  succinate  25 mg Oral Daily   Continuous Infusions: PRN Meds:.acetaminophen  **OR** acetaminophen , diphenhydrAMINE   Antimicrobials: Anti-infectives (From admission, onward)    Start     Dose/Rate Route Frequency Ordered Stop   04/15/23 1000  cefadroxil  (DURICEF) capsule 500 mg       Note to Pharmacy: Duricef 500 mg once daily for CrCl < 40 mL/min   500 mg Oral Daily 04/15/23 0803     04/13/23 1000   sulfamethoxazole -trimethoprim  (BACTRIM  DS) 800-160 MG per tablet 1 tablet  Status:  Discontinued        1 tablet Oral Every 12 hours 04/13/23 0757 04/15/23 0802   04/08/23 1000  meropenem  (MERREM ) 1 g in sodium chloride  0.9 % 100 mL IVPB  Status:  Discontinued        1 g 200 mL/hr over 30 Minutes Intravenous Every 12 hours 04/08/23 0827 04/13/23 0757   04/07/23 1415  meropenem  (MERREM ) 1 g in sodium chloride  0.9 % 100 mL IVPB        1 g 200 mL/hr over 30 Minutes Intravenous  Once 04/07/23 1403 04/07/23 1452        I have personally reviewed the following labs and images: CBC: Recent Labs  Lab 04/09/23 0439 04/10/23 0341 04/15/23 0639  WBC 8.0 8.9 4.3  NEUTROABS  --  5.0  --   HGB 10.1* 10.5* 10.5*  HCT 31.5* 32.6* 32.3*  MCV 94.0 93.1 91.8  PLT 329 319 241   BMP &GFR Recent Labs  Lab 04/09/23 0439 04/10/23 0341 04/15/23 0639  NA 134* 136 134*  K 4.3 4.8 4.5  CL 101 101 100  CO2 24 26 26   GLUCOSE 82 93 76  BUN 18 22 21   CREATININE 1.05* 1.12* 1.40*  CALCIUM 8.5* 8.6* 8.3*  MG  --  2.1 2.1  PHOS  --   --  4.0   Estimated Creatinine Clearance: 27.4 mL/min (A) (by C-G formula based on SCr of 1.4 mg/dL (H)). Liver & Pancreas: Recent Labs  Lab 04/15/23 0639  ALBUMIN 2.3*   No results for input(s): LIPASE, AMYLASE in the last 168 hours. No results for input(s): AMMONIA in the last 168 hours. Diabetic: No results for input(s): HGBA1C in the last 72 hours. Recent Labs  Lab 04/14/23 0737  GLUCAP 82   Cardiac Enzymes: No results for input(s): CKTOTAL, CKMB, CKMBINDEX, TROPONINI in the last 168 hours. No results for input(s): PROBNP in the last 8760 hours. Coagulation Profile: No results for input(s): INR, PROTIME in the last 168 hours. Thyroid Function Tests: No results for input(s): TSH, T4TOTAL, FREET4, T3FREE, THYROIDAB in the last 72 hours. Lipid Profile: No results for input(s): CHOL, HDL, LDLCALC, TRIG, CHOLHDL,  LDLDIRECT in the last 72 hours. Anemia Panel: No results for input(s): VITAMINB12, FOLATE, FERRITIN, TIBC, IRON, RETICCTPCT in the last 72 hours. Urine analysis:    Component Value Date/Time   COLORURINE AMBER (A) 12/10/2022 0105   APPEARANCEUR HAZY (A) 12/10/2022 0105   LABSPEC 1.021 12/10/2022 0105   PHURINE 5.0 12/10/2022 0105   GLUCOSEU NEGATIVE 12/10/2022 0105   HGBUR NEGATIVE 12/10/2022 0105   BILIRUBINUR NEGATIVE 12/10/2022 0105   KETONESUR 5 (A) 12/10/2022 0105   PROTEINUR NEGATIVE 12/10/2022 0105   NITRITE NEGATIVE 12/10/2022 0105   LEUKOCYTESUR NEGATIVE 12/10/2022 0105   Sepsis Labs: Invalid input(s): PROCALCITONIN, LACTICIDVEN  Microbiology: Recent Results (from the past 240 hours)  Blood culture (routine x 2)     Status: Abnormal   Collection Time: 04/07/23  1:37 PM   Specimen: BLOOD  Result Value Ref Range Status   Specimen Description   Final    BLOOD RIGHT ANTECUBITAL Performed at [facility], [address], [place], [place] [address]    Special Requests   Final    Blood Culture adequate volume BOTTLES DRAWN AEROBIC AND ANAEROBIC Performed at [facility], [address], [place], [place] [address]    Culture  Setup Time   Final    GRAM POSITIVE COCCI ANAEROBIC BOTTLE ONLY CRITICAL RESULT CALLED TO, READ BACK BY AND VERIFIED WITH: [facility]  04/09/23 MK    Culture (A)  Final    STAPHYLOCOCCUS EPIDERMIDIS THE SIGNIFICANCE OF ISOLATING THIS ORGANISM FROM A SINGLE VENIPUNCTURE CANNOT BE PREDICTED WITHOUT FURTHER CLINICAL AND CULTURE CORRELATION. SUSCEPTIBILITIES AVAILABLE ONLY ON REQUEST. Performed at [facility], [address], [place], [place] [address]    Report Status 04/10/2023 FINAL  Final  Resp panel by RT-PCR (RSV, Flu A&B, Covid) Anterior Nasal Swab     Status: None   Collection Time: 04/07/23  1:37 PM   Specimen: Anterior Nasal Swab  Result Value Ref Range Status    SARS Coronavirus 2 by RT PCR NEGATIVE NEGATIVE Final    Comment: (NOTE) SARS-CoV-2 target nucleic acids are NOT DETECTED.  The SARS-CoV-2 RNA is generally detectable in upper respiratory specimens during the acute phase of infection. The lowest concentration of SARS-CoV-2 viral copies this assay can detect is 138 copies/mL. A negative result does not preclude SARS-Cov-2 infection and should not be used as the sole basis for treatment or other patient management decisions. A negative result may occur with  improper specimen collection/handling, submission of specimen other than nasopharyngeal swab, presence of viral mutation(s) within the areas targeted by this assay, and inadequate number of viral copies(<138 copies/mL). A negative result must be combined with clinical observations, patient  history, and epidemiological information. The expected result is Negative.  Fact Sheet for Patients:  bloggercourse.com  Fact Sheet for Healthcare Providers:  seriousbroker.it  This test is no t yet approved or cleared by the [place]  FDA and  has been authorized for detection and/or diagnosis of SARS-CoV-2 by FDA under an Emergency Use Authorization (EUA). This EUA will remain  in effect (meaning this test can be used) for the duration of the COVID-19 declaration under Section 564(b)(1) of the Act, 21 U.S.C.section 360bbb-3(b)(1), unless the authorization is terminated  or revoked sooner.       Influenza A by PCR NEGATIVE NEGATIVE Final   Influenza B by PCR NEGATIVE NEGATIVE Final    Comment: (NOTE) The Xpert Xpress SARS-CoV-2/FLU/RSV plus assay is intended as an aid in the diagnosis of influenza from Nasopharyngeal swab specimens and should not be used as a sole basis for treatment. Nasal washings and aspirates are unacceptable for Xpert Xpress SARS-CoV-2/FLU/RSV testing.  Fact Sheet for  Patients: bloggercourse.com  Fact Sheet for Healthcare Providers: seriousbroker.it  This test is not yet approved or cleared by the [place]  FDA and has been authorized for detection and/or diagnosis of SARS-CoV-2 by FDA under an Emergency Use Authorization (EUA). This EUA will remain in effect (meaning this test can be used) for the duration of the COVID-19 declaration under Section 564(b)(1) of the Act, 21 U.S.C. section 360bbb-3(b)(1), unless the authorization is terminated or revoked.     Resp Syncytial Virus by PCR NEGATIVE NEGATIVE Final    Comment: (NOTE) Fact Sheet for Patients: bloggercourse.com  Fact Sheet for Healthcare Providers: seriousbroker.it  This test is not yet approved or cleared by the [place]  FDA and has been authorized for detection and/or diagnosis of SARS-CoV-2 by FDA under an Emergency Use Authorization (EUA). This EUA will remain in effect (meaning this test can be used) for the duration of the COVID-19 declaration under Section 564(b)(1) of the Act, 21 U.S.C. section 360bbb-3(b)(1), unless the authorization is terminated or revoked.  Performed at [facility], [address] [place], [place] [address]   Blood Culture ID Panel (Reflexed)     Status: Abnormal   Collection Time: 04/07/23  1:37 PM  Result Value Ref Range Status   Enterococcus faecalis NOT DETECTED NOT DETECTED Final   Enterococcus Faecium NOT DETECTED NOT DETECTED Final   Listeria monocytogenes NOT DETECTED NOT DETECTED Final   Staphylococcus species DETECTED (A) NOT DETECTED Final    Comment: CRITICAL RESULT CALLED TO, READ BACK BY AND VERIFIED WITH: J WYLAND,PHARMD@[email or web address]  04/09/23 MK    Staphylococcus aureus (BCID) NOT DETECTED NOT DETECTED Final   Staphylococcus epidermidis DETECTED (A) NOT DETECTED Final    Comment: CRITICAL RESULT CALLED TO, READ  BACK BY AND VERIFIED WITH: J WYLAND,PHARMD@[email or web address]  04/09/23 MK    Staphylococcus lugdunensis NOT DETECTED NOT DETECTED Final   Streptococcus species NOT DETECTED NOT DETECTED Final   Streptococcus agalactiae NOT DETECTED NOT DETECTED Final   Streptococcus pneumoniae NOT DETECTED NOT DETECTED Final   Streptococcus pyogenes NOT DETECTED NOT DETECTED Final   A.calcoaceticus-baumannii NOT DETECTED NOT DETECTED Final   Bacteroides fragilis NOT DETECTED NOT DETECTED Final   Enterobacterales NOT DETECTED NOT DETECTED Final   Enterobacter cloacae complex NOT DETECTED NOT DETECTED Final   Escherichia coli NOT DETECTED NOT DETECTED Final   Klebsiella aerogenes NOT DETECTED NOT DETECTED Final   Klebsiella oxytoca NOT DETECTED NOT DETECTED Final   Klebsiella pneumoniae NOT DETECTED NOT DETECTED Final   Proteus species NOT DETECTED  NOT DETECTED Final   Salmonella species NOT DETECTED NOT DETECTED Final   Serratia marcescens NOT DETECTED NOT DETECTED Final   Haemophilus influenzae NOT DETECTED NOT DETECTED Final   Neisseria meningitidis NOT DETECTED NOT DETECTED Final   Pseudomonas aeruginosa NOT DETECTED NOT DETECTED Final   Stenotrophomonas maltophilia NOT DETECTED NOT DETECTED Final   Candida albicans NOT DETECTED NOT DETECTED Final   Candida auris NOT DETECTED NOT DETECTED Final   Candida glabrata NOT DETECTED NOT DETECTED Final   Candida krusei NOT DETECTED NOT DETECTED Final   Candida parapsilosis NOT DETECTED NOT DETECTED Final   Candida tropicalis NOT DETECTED NOT DETECTED Final   Cryptococcus neoformans/gattii NOT DETECTED NOT DETECTED Final   Methicillin resistance mecA/C NOT DETECTED NOT DETECTED Final    Comment: Performed at [facility], [address], [place], [place] [address]  Culture, blood (Routine X 2) w Reflex to ID Panel     Status: None   Collection Time: 04/09/23  8:58 AM   Specimen: BLOOD LEFT ARM  Result Value Ref Range Status   Specimen Description BLOOD LEFT ARM   Final   Special Requests   Final    BOTTLES DRAWN AEROBIC AND ANAEROBIC Blood Culture results may not be optimal due to an inadequate volume of blood received in culture bottles   Culture   Final    NO GROWTH 5 DAYS Performed at [facility], [address], [place], [place] [address]    Report Status 04/14/2023 FINAL  Final  Culture, blood (Routine X 2) w Reflex to ID Panel     Status: None   Collection Time: 04/09/23  9:02 AM   Specimen: BLOOD LEFT HAND  Result Value Ref Range Status   Specimen Description BLOOD LEFT HAND  Final   Special Requests   Final    BOTTLES DRAWN AEROBIC AND ANAEROBIC Blood Culture results may not be optimal due to an inadequate volume of blood received in culture bottles   Culture   Final    NO GROWTH 5 DAYS Performed at [facility], [address], [place], [place] [address]    Report Status 04/14/2023 FINAL  Final  SARS Coronavirus 2 by RT PCR (hospital order, performed in [facility] hospital lab) *cepheid single result test* Anterior Nasal Swab     Status: None   Collection Time: 04/10/23 11:28 AM   Specimen: Anterior Nasal Swab  Result Value Ref Range Status   SARS Coronavirus 2 by RT PCR NEGATIVE NEGATIVE Final    Comment: Performed at [facility], [address] [place], [place] [address]    Radiology Studies: No results found.    Sandford Diop T. Shenee Wignall [facility]  If 7PM-7AM, please contact night-coverage [email or web address] 04/15/2023, 3:13 PM

## 2023-04-15 NOTE — Progress Notes (Signed)
 pt with new onset rash on L arm and redness on her back. She has been complaining of on going itchiness prn Benadryl  given  MD Gonfa notified via epic chat

## 2023-04-15 NOTE — Plan of Care (Signed)
  Problem: Clinical Measurements: Goal: Will remain free from infection 04/15/2023 0705 by Sylvio Weatherall, Christobal Craft, RN Outcome: Progressing

## 2023-04-15 NOTE — Progress Notes (Signed)
 Dawn Roy is up in in the chair today.  She denies any fever or chills.  She is tolerating a diet.  She states that she overall feels very good.  She has remained afebrile and her white count has remained within the normal range.  On examination today she has markedly decreased erythema of the right breast and no erythema of the left breast.  The ulcerations along the inframammary fold are improved.  The wound in the center of the breast is actually her reconstructed nipple.  Pathology report returns today.  Both biopsies show a spongiotic dermatitis with hyperkeratosis, there is no carcinoma.  The comment states that the differential diagnosis would be a contact dermatitis versus an unusual drug reaction or a fungal infection PAS stains have been added.  At this time there is no indication for surgical intervention.  I have discussed this with the patient and her family.  I would still like for them to follow-up in my office the week of 17 February . I will be out of town until the 17th.  If there are any issues or concerns my partner, Estefana Fritter, is available.

## 2023-04-15 NOTE — Progress Notes (Signed)
 Ok to hold Metoprolol  due to soft BP as per MD.  BP recheck 97/54 Pulse 82. Patient asymptomatic, sitting up on recliner.

## 2023-04-16 ENCOUNTER — Other Ambulatory Visit (HOSPITAL_BASED_OUTPATIENT_CLINIC_OR_DEPARTMENT_OTHER): Payer: Self-pay

## 2023-04-16 DIAGNOSIS — L308 Other specified dermatitis: Secondary | ICD-10-CM | POA: Insufficient documentation

## 2023-04-16 DIAGNOSIS — L0390 Cellulitis, unspecified: Secondary | ICD-10-CM | POA: Diagnosis not present

## 2023-04-16 DIAGNOSIS — I4819 Other persistent atrial fibrillation: Secondary | ICD-10-CM | POA: Diagnosis not present

## 2023-04-16 DIAGNOSIS — F32A Depression, unspecified: Secondary | ICD-10-CM | POA: Diagnosis not present

## 2023-04-16 DIAGNOSIS — Z20822 Contact with and (suspected) exposure to covid-19: Secondary | ICD-10-CM | POA: Diagnosis not present

## 2023-04-16 LAB — RENAL FUNCTION PANEL
Albumin: 2.3 g/dL — ABNORMAL LOW (ref 3.5–5.0)
Anion gap: 7 (ref 5–15)
BUN: 22 mg/dL (ref 8–23)
CO2: 29 mmol/L (ref 22–32)
Calcium: 8.2 mg/dL — ABNORMAL LOW (ref 8.9–10.3)
Chloride: 98 mmol/L (ref 98–111)
Creatinine, Ser: 1.18 mg/dL — ABNORMAL HIGH (ref 0.44–1.00)
GFR, Estimated: 44 mL/min — ABNORMAL LOW (ref >60)
Glucose, Bld: 84 mg/dL (ref 70–99)
Phosphorus: 4.2 mg/dL (ref 2.5–4.6)
Potassium: 4.8 mmol/L (ref 3.5–5.1)
Sodium: 134 mmol/L — ABNORMAL LOW (ref 135–145)

## 2023-04-16 LAB — CBC
HCT: 31.6 % — ABNORMAL LOW (ref 36.0–46.0)
Hemoglobin: 10.1 g/dL — ABNORMAL LOW (ref 12.0–15.0)
MCH: 29.6 pg (ref 26.0–34.0)
MCHC: 32 g/dL (ref 30.0–36.0)
MCV: 92.7 fL (ref 80.0–100.0)
Platelets: 237 10*3/uL (ref 150–400)
RBC: 3.41 MIL/uL — ABNORMAL LOW (ref 3.87–5.11)
RDW: 13.3 % (ref 11.5–15.5)
WBC: 4.3 10*3/uL (ref 4.0–10.5)
nRBC: 0 % (ref 0.0–0.2)

## 2023-04-16 LAB — MAGNESIUM: Magnesium: 2 mg/dL (ref 1.7–2.4)

## 2023-04-16 LAB — SURGICAL PATHOLOGY

## 2023-04-16 MED ORDER — HYDROCORTISONE 1 % EX OINT
TOPICAL_OINTMENT | Freq: Two times a day (BID) | CUTANEOUS | 0 refills | Status: AC
  Filled 2023-04-16: qty 28, 14d supply, fill #0

## 2023-04-16 NOTE — Plan of Care (Signed)

## 2023-04-16 NOTE — Care Management Important Message (Signed)
 Important Message  Patient Details  Name: Dawn Roy MRN: [id number] Date of Birth: 1929/05/11   Important Message Given:  Yes - Medicare IM     Janith Melnick 04/16/2023, 12:10 PM

## 2023-04-16 NOTE — Plan of Care (Signed)
  Problem: Clinical Measurements: Goal: Will remain free from infection Outcome: Progressing   Problem: Clinical Measurements: Goal: Respiratory complications will improve Outcome: Progressing   Problem: Activity: Goal: Risk for activity intolerance will decrease Outcome: Progressing   Problem: Nutrition: Goal: Adequate nutrition will be maintained Outcome: Progressing   Problem: Elimination: Goal: Will not experience complications related to bowel motility Outcome: Progressing   Problem: Pain Managment: Goal: General experience of comfort will improve and/or be controlled Outcome: Progressing

## 2023-04-16 NOTE — Discharge Summary (Signed)
 Physician Discharge Summary  Dawn Roy FMW:[id number] DOB: 1929-07-04 DOA: 04/07/2023  PCP: Dawn Charm, MD  Admit date: 04/07/2023 Discharge date: 04/16/23  Admitted From: Home Disposition: Home Recommendations for Outpatient Follow-up:  Outpatient follow-up PCP and plastic surgery as below Check blood pressure, CMP and CBC at follow-up Please follow up on the following pending results: None  Home Health: HH PT/OT/RN Equipment/Devices: None  Discharge Condition: Stable CODE STATUS: DNR/DNI  Follow-up Information     Dawn Charm, MD. Schedule an appointment as soon as possible for a visit in 1 week(s).   Specialty: Internal Medicine Contact information: [address] [place] [address] [phone]         [name], MD. Go on 04/21/2023.   Specialty: Plastic Surgery Contact information: [address] [place] [address] [place] [address] [phone]         Care, [facility] Follow up.   Contact information: [address] [name] [address] [phone]                 Hospital course 89 year old F with PMH of  breast cancer s/p bilateral mastectomy (L 1969, R 1977 followed by gel implant 1981), atrial fib, depression, HTN, osteoporosis, arthritis who presented with wounds on breasts worse on the right, and admitted with working diagnosis of cellulitis.  CT chest without contrast showed skin thickening along the breasts bilaterally without underlying abscess, mild pulmonary edema and enlarged pulmonary trunk suggesting pulmonary hypertension.  Started on IV meropenem .  Plastic surgery consulted and patient underwent punch biopsies of bilateral breast tissue.  Patient improved with IV antibiotics.  Antibiotic de-escalated to Bactrim  on 2/4 and changed to cefadroxil  on 04/15/2023 but she developed skin rash has resolved with Benadryl .  Antibiotic discontinued.      On the day of discharge,  patient's cellulitis improved.  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  No malignancy. Started on hydrocortisone  ointment twice daily.  She is discharged with home health RN, PT/OT.  She will follow-up with plastic surgery on 2/17.  See individual problem list below for more.   Problems addressed during this hospitalization Cellulitis of bilateral breast without abscess-CT without drainable abscess.   Patient with history of bilateral breast cancer s/p remote bilateral mastectomy and implant.  Underwent bilateral punch biopsy by plastic surgery on 1/31.  Initial blood culture with Staph epidermidis but only 1 bottle obtained.  Repeat blood culture NGTD.  Continued on antibiotics with improvement.   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   -IV meropenem  1/29>> p.o. Bactrim  2/4-2/6>> cefadroxil  2/6.  Cefadroxil  discontinued due to rash. -Hydrocortisone  ointment twice daily.  Advised to avoid open skin.  Wound care -Outpatient follow-up with plastic surgery on 2/12   Positive blood culture: Likely contaminant.  Blood culture on 1/29 with staph epidermis but only 1 bottle obtained.  Repeat blood culture on 1/31 NGTD.   Persistent A-fib: Rate controlled. -Continue Eliquis  and Toprol -XL.   Hypotension/history of essential hypertension: Hypotensive this morning but not symptomatic.  Not orthostatic either. -Continue home Toprol -XL.  Advised to hold Lasix    CKD-3A: Baseline Cr about 1.1.  Creatinine went up in the setting of hypotension, Lasix  and Bactrim  but improved. -Recommend repeat renal function in 1 week -Advised to hold Lasix  for the next 3 to 4 days.   B12 deficiency: 178 on 1/31. -Received B12 injection from 1/31 to 2/4. -P.o. vitamin B12 on  discharge   Close exposure to COVID-19 virus: Daughter tested positive for 1/31.  Patient is negative on the 1/29 and 2/1.   No respiratory symptoms.   Depression: Stable -Continue celexa    Class II obesity Body mass index is 36.58 kg/m.             Time spent 35 minutes  Vital signs Vitals:   04/16/23 0119 04/16/23 0413 04/16/23 0900 04/16/23 1006  BP: 125/67 128/73 125/67 114/78  Pulse: 87 94 89 95  Temp:  (!) 97.5 F (36.4 C) 97.8 F (36.6 C)   Resp: 20 18 18    Height:      Weight:      SpO2: 94% 98% 99%   TempSrc:  Oral Oral   BMI (Calculated):         Discharge exam  GENERAL: No apparent distress.  Nontoxic. HEENT: MMM.  Vision and hearing grossly intact.  NECK: Supple.  No apparent JVD.  RESP:  No IWOB.  Fair aeration bilaterally. CVS:  RRR. Heart sounds normal.  ABD/GI/GU: BS+. Abd soft, NTND.  MSK/EXT:  Moves extremities. No apparent deformity. No edema.  SKIN: no apparent skin lesion or wound NEURO: Awake and alert. Oriented appropriately.  No apparent focal neuro deficit. PSYCH: Calm. Normal affect.   Breast exam: S/p bilateral mastectomy with implant.  Some erythema in right breast with a couple of small ulceration without significant discharge.  No increased warmth to touch.  Small areas of erythema with small ulceration at the site of punch biopsy and left breast.  See picture in media for more.  No increased warmth to touch.  [name], LPN present as chaperone during breast exam  Discharge Instructions Discharge Instructions     Diet - low sodium heart healthy   Complete by: As directed    Discharge instructions   Complete by: As directed    It has been a pleasure taking care of you!  You were hospitalized due to concern for cellulitis for which you have been treated with antibiotics.  Your biopsy showed spongiotic dermatitis, which is a type of skin allergy.  We have started you on a steroid ointment.  Continue steroid ointment.  Do not apply to the wound directly.  Follow-up with plastic surgery.  Follow-up with your primary care doctor as well.  Recommend  holding your Lasix  for the next 3 to 4 days   Take care,   Discharge wound care:   Complete by: As directed    Wound care  Daily      Comments: Moisten previous dressings with NS to assist with removal each time. Apply Xeroform gauze to bilat breast lesions, then cover with either ABD pads and tape, or cut the crotch out of a pair of mesh underwear and use as a tank top to hold dressings in place and avoid the use of tape.   Increase activity slowly   Complete by: As directed       Allergies as of 04/16/2023       Reactions   Azithromycin Hives   Dalmane [flurazepam] Other (See Comments)   unknown   Demerol [meperidine] Other (See Comments)   Unknown   Felodipine Other (See Comments)   unknown   Fosamax [alendronate Sodium] Other (See Comments)   unknown   Hctz [hydrochlorothiazide] Other (See Comments)   Unknown   Ivp Dye [iodinated Contrast Media] Other (See Comments)   unknown   Keflin [cephalothin] Other (See Comments)   unknown  Macrodantin [nitrofurantoin Macrocrystal] Other (See Comments)   unknown   Morphine And Codeine Other (See Comments)   unknown   Phenergan [promethazine Hcl] Other (See Comments)   unknown   Valium [diazepam] Other (See Comments)   unknown   Doxycycline  Rash   headache   Keflex [cephalexin] Rash   Neosporin [neomycin-bacitracin Zn-polymyx] Rash   Penicillins Rash        Medication List     STOP taking these medications    guaiFENesin  600 MG 12 hr tablet Commonly known as: MUCINEX        TAKE these medications    apixaban  5 MG Tabs tablet Commonly known as: ELIQUIS  Take 1 tablet (5 mg total) by mouth 2 (two) times daily.   cholecalciferol 1000 units tablet Commonly known as: VITAMIN D Take 1,000 Units by mouth daily.   citalopram  40 MG tablet Commonly known as: CELEXA  Take 20 mg by mouth daily.   diclofenac  Sodium 1 % Gel Commonly known as: VOLTAREN  Apply 2 g topically 4 (four) times daily as needed (knee pain).    furosemide  20 MG tablet Commonly known as: LASIX  Take 20 mg by mouth daily as needed (for stasis detrmatitis).   gabapentin  100 MG capsule Commonly known as: NEURONTIN  Take 100 mg by mouth every evening.   hydrocortisone  1 % ointment Apply topically 2 (two) times daily for 14 days. Do not apply to the wound directly   loratadine  10 MG tablet Commonly known as: CLARITIN  Take 10 mg by mouth daily.   metoprolol  succinate 50 MG 24 hr tablet Commonly known as: TOPROL -XL Take 1 tablet (50 mg total) by mouth daily. Take with or immediately following a meal.   PROBIOTIC DAILY PO Take 1 capsule by mouth daily. Culturelle               Discharge Care Instructions  (From admission, onward)           Start     Ordered   04/16/23 0000  Discharge wound care:       Comments: Wound care  Daily      Comments: Moisten previous dressings with NS to assist with removal each time. Apply Xeroform gauze to bilat breast lesions, then cover with either ABD pads and tape, or cut the crotch out of a pair of mesh underwear and use as a tank top to hold dressings in place and avoid the use of tape.   04/16/23 0824            Consultations: Plastic surgery  Procedures/Studies: 1/31-punch biopsies of bilateral breast.  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    CT Chest Wo Contrast Result Date: 04/07/2023 CLINICAL DATA:  Chest wall cellulitis, possible abscess. Breast ulcers. EXAM: CT CHEST WITHOUT CONTRAST TECHNIQUE: Multidetector CT imaging of the chest was performed following the standard protocol without IV contrast. RADIATION DOSE REDUCTION: This exam was performed according to the departmental dose-optimization program which includes automated exposure control, adjustment of the mA and/or kV according to patient size and/or use of iterative reconstruction technique. COMPARISON:  12/10/2022. FINDINGS: Cardiovascular:  Atherosclerotic calcification of the aorta and aortic valve. Enlarged pulmonic trunk and heart. No pericardial effusion. Mediastinum/Nodes: 2.9 cm low-attenuation left thyroid nodule. Given advanced patient age, no follow-up recommended unless clinically warranted. (Ref: J Am Coll Radiol. 2015 Feb;12(2): 143-50). No pathologically enlarged mediastinal or axillary lymph nodes. Hilar regions are difficult to definitively evaluate without IV contrast. Internally heterogeneous breast implants, as on 12/10/2022, indicative  of intracapsular rupture. There is skin thickening along both breasts. No underlying fluid collection to indicate an abscess. Lungs/Pleura: Mild septal thickening. 5 mm ground-glass right lower lobe nodule, unchanged. Per Fleischner Society guidelines, no follow-up is necessary. No pleural fluid. Airway is unremarkable. Upper Abdomen: Mild intrahepatic biliary ductal dilatation, as before. Cholecystectomy. Tiny hiatal hernia. Visualized portions of the liver, adrenal glands, kidneys, spleen, pancreas, stomach and bowel are otherwise grossly unremarkable. No upper abdominal adenopathy. Musculoskeletal: Degenerative changes in the spine. IMPRESSION: 1. Skin thickening along the breasts bilaterally without underlying abscess. 2. Mild pulmonary parenchymal septal thickening is suggestive of pulmonary edema. 3. Aortic atherosclerosis (ICD10-I70.0). 4. Enlarged pulmonic trunk, indicative of pulmonary arterial hypertension. Electronically Signed   By: Newell Eke M.D.   On: 04/07/2023 17:13       The results of significant diagnostics from this hospitalization (including imaging, microbiology, ancillary and laboratory) are listed below for reference.     Microbiology: Recent Results (from the past 240 hours)  Blood culture (routine x 2)     Status: Abnormal   Collection Time: 04/07/23  1:37 PM   Specimen: BLOOD  Result Value Ref Range Status   Specimen Description   Final    BLOOD RIGHT  ANTECUBITAL Performed at [facility], [address], [place], [place] [address]    Special Requests   Final    Blood Culture adequate volume BOTTLES DRAWN AEROBIC AND ANAEROBIC Performed at [facility], [address], [place], [place] [address]    Culture  Setup Time   Final    GRAM POSITIVE COCCI ANAEROBIC BOTTLE ONLY CRITICAL RESULT CALLED TO, READ BACK BY AND VERIFIED WITH: J WYLAND,[email or web address]  04/09/23 MK    Culture (A)  Final    STAPHYLOCOCCUS EPIDERMIDIS THE SIGNIFICANCE OF ISOLATING THIS ORGANISM FROM A SINGLE VENIPUNCTURE CANNOT BE PREDICTED WITHOUT FURTHER CLINICAL AND CULTURE CORRELATION. SUSCEPTIBILITIES AVAILABLE ONLY ON REQUEST. Performed at [facility], [address] [place], [place] [address]    Report Status 04/10/2023 FINAL  Final  Resp panel by RT-PCR (RSV, Flu A&B, Covid) Anterior Nasal Swab     Status: None   Collection Time: 04/07/23  1:37 PM   Specimen: Anterior Nasal Swab  Result Value Ref Range Status   SARS Coronavirus 2 by RT PCR NEGATIVE NEGATIVE Final    Comment: (NOTE) SARS-CoV-2 target nucleic acids are NOT DETECTED.  The SARS-CoV-2 RNA is generally detectable in upper respiratory specimens during the acute phase of infection. The lowest concentration of SARS-CoV-2 viral copies this assay can detect is 138 copies/mL. A negative result does not preclude SARS-Cov-2 infection and should not be used as the sole basis for treatment or other patient management decisions. A negative result may occur with  improper specimen collection/handling, submission of specimen other than nasopharyngeal swab, presence of viral mutation(s) within the areas targeted by this assay, and inadequate number of viral copies(<138 copies/mL). A negative result must be combined with clinical observations, patient history, and epidemiological information. The expected result is Negative.  Fact Sheet for Patients:   bloggercourse.com  Fact Sheet for Healthcare Providers:  seriousbroker.it  This test is no t yet approved or cleared by the [place]  FDA and  has been authorized for detection and/or diagnosis of SARS-CoV-2 by FDA under an Emergency Use Authorization (EUA). This EUA will remain  in effect (meaning this test can be used) for the duration of the COVID-19 declaration under Section 564(b)(1) of the Act, 21 U.S.C.section 360bbb-3(b)(1), unless the  authorization is terminated  or revoked sooner.       Influenza A by PCR NEGATIVE NEGATIVE Final   Influenza B by PCR NEGATIVE NEGATIVE Final    Comment: (NOTE) The Xpert Xpress SARS-CoV-2/FLU/RSV plus assay is intended as an aid in the diagnosis of influenza from Nasopharyngeal swab specimens and should not be used as a sole basis for treatment. Nasal washings and aspirates are unacceptable for Xpert Xpress SARS-CoV-2/FLU/RSV testing.  Fact Sheet for Patients: bloggercourse.com  Fact Sheet for Healthcare Providers: seriousbroker.it  This test is not yet approved or cleared by the [place]  FDA and has been authorized for detection and/or diagnosis of SARS-CoV-2 by FDA under an Emergency Use Authorization (EUA). This EUA will remain in effect (meaning this test can be used) for the duration of the COVID-19 declaration under Section 564(b)(1) of the Act, 21 U.S.C. section 360bbb-3(b)(1), unless the authorization is terminated or revoked.     Resp Syncytial Virus by PCR NEGATIVE NEGATIVE Final    Comment: (NOTE) Fact Sheet for Patients: bloggercourse.com  Fact Sheet for Healthcare Providers: seriousbroker.it  This test is not yet approved or cleared by the [place]  FDA and has been authorized for detection and/or diagnosis of SARS-CoV-2 by FDA under an Emergency Use  Authorization (EUA). This EUA will remain in effect (meaning this test can be used) for the duration of the COVID-19 declaration under Section 564(b)(1) of the Act, 21 U.S.C. section 360bbb-3(b)(1), unless the authorization is terminated or revoked.  Performed at [facility], [address] [place], [place] [address]   Blood Culture ID Panel (Reflexed)     Status: Abnormal   Collection Time: 04/07/23  1:37 PM  Result Value Ref Range Status   Enterococcus faecalis NOT DETECTED NOT DETECTED Final   Enterococcus Faecium NOT DETECTED NOT DETECTED Final   Listeria monocytogenes NOT DETECTED NOT DETECTED Final   Staphylococcus species DETECTED (A) NOT DETECTED Final    Comment: CRITICAL RESULT CALLED TO, READ BACK BY AND VERIFIED WITH: J WYLAND,[email or web address]  04/09/23 MK    Staphylococcus aureus (BCID) NOT DETECTED NOT DETECTED Final   Staphylococcus epidermidis DETECTED (A) NOT DETECTED Final    Comment: CRITICAL RESULT CALLED TO, READ BACK BY AND VERIFIED WITH: J WYLAND,PHARMD@[email or web address]  04/09/23 MK    Staphylococcus lugdunensis NOT DETECTED NOT DETECTED Final   Streptococcus species NOT DETECTED NOT DETECTED Final   Streptococcus agalactiae NOT DETECTED NOT DETECTED Final   Streptococcus pneumoniae NOT DETECTED NOT DETECTED Final   Streptococcus pyogenes NOT DETECTED NOT DETECTED Final   A.calcoaceticus-baumannii NOT DETECTED NOT DETECTED Final   Bacteroides fragilis NOT DETECTED NOT DETECTED Final   Enterobacterales NOT DETECTED NOT DETECTED Final   Enterobacter cloacae complex NOT DETECTED NOT DETECTED Final   Escherichia coli NOT DETECTED NOT DETECTED Final   Klebsiella aerogenes NOT DETECTED NOT DETECTED Final   Klebsiella oxytoca NOT DETECTED NOT DETECTED Final   Klebsiella pneumoniae NOT DETECTED NOT DETECTED Final   Proteus species NOT DETECTED NOT DETECTED Final   Salmonella species NOT DETECTED NOT DETECTED Final   Serratia marcescens NOT DETECTED NOT  DETECTED Final   Haemophilus influenzae NOT DETECTED NOT DETECTED Final   Neisseria meningitidis NOT DETECTED NOT DETECTED Final   Pseudomonas aeruginosa NOT DETECTED NOT DETECTED Final   Stenotrophomonas maltophilia NOT DETECTED NOT DETECTED Final   Candida albicans NOT DETECTED NOT DETECTED Final   Candida auris NOT DETECTED NOT DETECTED Final   Candida glabrata NOT DETECTED NOT DETECTED Final   Candida krusei NOT  DETECTED NOT DETECTED Final   Candida parapsilosis NOT DETECTED NOT DETECTED Final   Candida tropicalis NOT DETECTED NOT DETECTED Final   Cryptococcus neoformans/gattii NOT DETECTED NOT DETECTED Final   Methicillin resistance mecA/C NOT DETECTED NOT DETECTED Final    Comment: Performed at [facility], [address], [place], [place] [address]  Culture, blood (Routine X 2) w Reflex to ID Panel     Status: None   Collection Time: 04/09/23  8:58 AM   Specimen: BLOOD LEFT ARM  Result Value Ref Range Status   Specimen Description BLOOD LEFT ARM  Final   Special Requests   Final    BOTTLES DRAWN AEROBIC AND ANAEROBIC Blood Culture results may not be optimal due to an inadequate volume of blood received in culture bottles   Culture   Final    NO GROWTH 5 DAYS Performed at [facility], [address], [place], [place] [address]    Report Status 04/14/2023 FINAL  Final  Culture, blood (Routine X 2) w Reflex to ID Panel     Status: None   Collection Time: 04/09/23  9:02 AM   Specimen: BLOOD LEFT HAND  Result Value Ref Range Status   Specimen Description BLOOD LEFT HAND  Final   Special Requests   Final    BOTTLES DRAWN AEROBIC AND ANAEROBIC Blood Culture results may not be optimal due to an inadequate volume of blood received in culture bottles   Culture   Final    NO GROWTH 5 DAYS Performed at [facility], [address], [place], [place] [address]    Report Status 04/14/2023 FINAL  Final  SARS Coronavirus 2 by RT PCR (hospital order, performed in [facility] hospital lab) *cepheid single result test* Anterior Nasal Swab     Status: None   Collection Time: 04/10/23 11:28 AM   Specimen: Anterior Nasal Swab  Result Value Ref Range Status   SARS Coronavirus 2 by RT PCR NEGATIVE NEGATIVE Final    Comment: Performed at [facility], [address] [place], [place] [address]     Labs:  CBC: Recent Labs  Lab 04/10/23 [phone] 04/15/23 0639 04/16/23 0510  WBC 8.9 4.3 4.3  NEUTROABS 5.0  --   --   HGB 10.5* 10.5* 10.1*  HCT 32.6* 32.3* 31.6*  MCV 93.1 91.8 92.7  PLT 319 241 237   BMP &GFR Recent Labs  Lab 04/10/23 0341 04/15/23 0639 04/16/23 0510  NA 136 134* 134*  K 4.8 4.5 4.8  CL 101 100 98  CO2 26 26 29   GLUCOSE 93 76 84  BUN 22 21 22   CREATININE 1.12* 1.40* 1.18*  CALCIUM 8.6* 8.3* 8.2*  MG 2.1 2.1 2.0  PHOS  --  4.0 4.2   Estimated Creatinine Clearance: 32.5 mL/min (A) (by C-G formula based on SCr of 1.18 mg/dL (H)). Liver & Pancreas: Recent Labs  Lab 04/15/23 0639 04/16/23 0510  ALBUMIN 2.3* 2.3*   No results for input(s): LIPASE, AMYLASE in the last 168 hours. No results for input(s): AMMONIA in the last 168 hours. Diabetic: No results for input(s): HGBA1C in the last 72 hours. Recent Labs  Lab 04/14/23 0737  GLUCAP 82   Cardiac Enzymes: No results for input(s): CKTOTAL, CKMB, CKMBINDEX, TROPONINI in the last 168 hours. No results for input(s): PROBNP in the last 8760 hours. Coagulation Profile: No results for input(s): INR, PROTIME in the last 168 hours. Thyroid Function Tests: No results  for input(s): TSH, T4TOTAL, FREET4, T3FREE, THYROIDAB in the last 72 hours. Lipid Profile: No results for input(s): CHOL, HDL, LDLCALC, TRIG, CHOLHDL, LDLDIRECT in the last 72 hours. Anemia Panel: No results for input(s): VITAMINB12, FOLATE, FERRITIN, TIBC, IRON, RETICCTPCT in the last 72 hours. Urine analysis:    Component Value Date/Time   COLORURINE  AMBER (A) 12/10/2022 0105   APPEARANCEUR HAZY (A) 12/10/2022 0105   LABSPEC 1.021 12/10/2022 0105   PHURINE 5.0 12/10/2022 0105   GLUCOSEU NEGATIVE 12/10/2022 0105   HGBUR NEGATIVE 12/10/2022 0105   BILIRUBINUR NEGATIVE 12/10/2022 0105   KETONESUR 5 (A) 12/10/2022 0105   PROTEINUR NEGATIVE 12/10/2022 0105   NITRITE NEGATIVE 12/10/2022 0105   LEUKOCYTESUR NEGATIVE 12/10/2022 0105   Sepsis Labs: Invalid input(s): PROCALCITONIN, LACTICIDVEN   SIGNED:  Johnnathan Hagemeister T Aniesa Boback, MD  [facility] 04/16/2023, 3:56 PM

## 2023-04-16 NOTE — Progress Notes (Signed)
 Dr. Carry Clapper asked me to chaperone him as he assessed the pt's wounds on breast bilaterally.

## 2023-04-16 NOTE — Progress Notes (Signed)
 Dawn Roy Heather to be D/C'd  per MD order.  Discussed with the patient and all questions fully answered.  VSS, Skin clean, dry and intact without evidence of skin break down, no evidence of skin tears noted.  IV catheter discontinued intact. Site without signs and symptoms of complications. Dressing and pressure applied.  An After Visit Summary was printed and given to the patient. Patient given instructions to pick up prescription at pharmacy listed on AVS.  D/c education completed with patient/family including follow up instructions, medication list, d/c activities limitations if indicated, with other d/c instructions as indicated by MD - patient able to verbalize understanding, all questions fully answered.   Patient instructed to return to ED, call 911, or call MD for any changes in condition.   Patient to be escorted via WC, and D/C home via private auto by her granddaughter.

## 2023-04-16 NOTE — TOC Progression Note (Addendum)
 Transition of Care (TOC) - Progression Note   Followed back up with patient and grand daughter at bedside regarding home health preference. Received consent to call patient's daughter Dawn Roy. NCM called Dawn Roy and emailed her the medicare.gov list of home health agencies. Provided Dawn Roy with NCM direct cell phone number and explained home health should be arranged prior to discharge today . Dawn Roy voiced understanding. Await Dawn Roy decision.   Dawn Roy called back, her choices are : 1) [facility], 2)[facility],3)[facility]  Dawn Roy with [facility] checking to see if she can accept referral. Await determination   [id number] Dawn Roy with [facility] accepted referral. AVS updated called daughter .  Patient Details  Name: Dawn Roy MRN: [id number] Date of Birth: Dec 04, 1929  Transition of Care [facility]) CM/SW Contact  Ananth Fiallos, Powell Jansky, RN Phone Number: 04/16/2023, 9:48 AM  Clinical Narrative:       Expected Discharge Plan: Home w Home Health Services Barriers to Discharge: Continued Medical Work up  Expected Discharge Plan and Services In-house Referral: NA Discharge Planning Services: CM Consult Post Acute Care Choice: Home Health Living arrangements for the past 2 months: Single Family Home Expected Discharge Date: 04/16/23               DME Arranged: N/A DME Agency: NA       HH Arranged:  (see note)           Social Determinants of Health (SDOH) Interventions SDOH Screenings   Food Insecurity: No Food Insecurity (04/08/2023)  Housing: Low Risk  (04/08/2023)  Transportation Needs: No Transportation Needs (04/08/2023)  Utilities: Not At Risk (04/08/2023)  Social Connections: Moderately Isolated (04/08/2023)  Tobacco Use: Low Risk  (04/07/2023)    Readmission Risk Interventions     No data to display

## 2023-04-26 ENCOUNTER — Ambulatory Visit (INDEPENDENT_AMBULATORY_CARE_PROVIDER_SITE_OTHER): Payer: Medicare Other | Admitting: Physician Assistant

## 2023-04-26 VITALS — BP 103/59 | HR 66

## 2023-04-26 DIAGNOSIS — Z9889 Other specified postprocedural states: Secondary | ICD-10-CM

## 2023-04-26 DIAGNOSIS — L98499 Non-pressure chronic ulcer of skin of other sites with unspecified severity: Secondary | ICD-10-CM

## 2023-04-26 DIAGNOSIS — Z9013 Acquired absence of bilateral breasts and nipples: Secondary | ICD-10-CM

## 2023-04-26 DIAGNOSIS — Z9882 Breast implant status: Secondary | ICD-10-CM | POA: Diagnosis not present

## 2023-04-26 MED ORDER — CIPROFLOXACIN HCL 500 MG PO TABS: 500.0000 mg | ORAL_TABLET | Freq: Two times a day (BID) | ORAL | 0 refills | Status: AC

## 2023-04-26 NOTE — Progress Notes (Signed)
 Referring Provider Lorenda Ishihara, MD [address] [place],  [place] [address]   CC:  Chief Complaint  Patient presents with   Follow-up      Dawn Roy is an 89 y.o. female.  HPI: Patient is a pleasant 89 year old with history of bilateral mastectomy and gel implant-based reconstruction who was admitted to the hospital 04/08/2023 for ulcerations of bilateral breasts R > L with concern for cellulitis without drainable abscess.   This had been ongoing for over a month.  Her implants were placed over 40 years ago.  She denies any history of radiation.  Patient was treated with IV meropenem and improved, ultimately was de-escalated to cefadroxil before being discontinued.  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  No malignancy.  She was started on hydrocortisone ointment twice daily and discharged home with [facility] and provided outpatient follow-up with plastic surgery.  Today, she is accompanied by her daughter at bedside.  Daughter reports that in the middle of the night patient is taking off her bandages and scratching at her breasts, likely while asleep.  There has been no recurrence and the ulcerations on each breast.  Daughter is concerned because the left breast is starting to look red as though she is developing an infection.  They deny any systemic symptoms and patient is otherwise doing well.  She denies any associated pain or discomfort as she does not have any sensation involving the breast.  She also denies any itching.  Daughter is hopeful that something can be done to more definitively manage these ulcerations slight this does not become an ongoing issue.   Allergies  Allergen Reactions   Azithromycin Hives   Dalmane [Flurazepam] Other (See Comments)    unknown   Demerol [Meperidine] Other (See Comments)    Unknown    Felodipine Other (See Comments)    unknown   Fosamax [Alendronate Sodium]  Other (See Comments)    unknown   Hctz [Hydrochlorothiazide] Other (See Comments)    Unknown    Ivp Dye [Iodinated Contrast Media] Other (See Comments)    unknown   Keflin [Cephalothin] Other (See Comments)    unknown   Macrodantin [Nitrofurantoin Macrocrystal] Other (See Comments)    unknown   Morphine And Codeine Other (See Comments)    unknown   Phenergan [Promethazine Hcl] Other (See Comments)    unknown   Valium [Diazepam] Other (See Comments)    unknown   Doxycycline Rash    headache   Keflex [Cephalexin] Rash   Neosporin [Neomycin-Bacitracin Zn-Polymyx] Rash   Penicillins Rash    Outpatient Encounter Medications as of 04/26/2023  Medication Sig Note   apixaban (ELIQUIS) 5 MG TABS tablet Take 1 tablet (5 mg total) by mouth 2 (two) times daily.    cholecalciferol (VITAMIN D) 1000 UNITS tablet Take 1,000 Units by mouth daily.    citalopram (CELEXA) 40 MG tablet Take 20 mg by mouth daily.    diclofenac Sodium (VOLTAREN) 1 % GEL Apply 2 g topically 4 (four) times daily as needed (knee pain). 04/08/2023: unknown   furosemide (LASIX) 20 MG tablet Take 20 mg by mouth daily as needed (for stasis detrmatitis). 04/08/2023: unknown   gabapentin (NEURONTIN) 100 MG capsule Take 100 mg by mouth every evening.    hydrocortisone 1 % ointment Apply topically 2 (two) times daily for 14 days. Do not apply to the wound directly    loratadine (CLARITIN) 10 MG tablet Take 10 mg by  mouth daily.    metoprolol succinate (TOPROL-XL) 50 MG 24 hr tablet Take 1 tablet (50 mg total) by mouth daily. Take with or immediately following a meal.    Probiotic Product (PROBIOTIC DAILY PO) Take 1 capsule by mouth daily. Culturelle    No facility-administered encounter medications on file as of 04/26/2023.     Past Medical History:  Diagnosis Date   Arthritis    Basal cell carcinoma    Breast cancer (HCC)    Depression    Hypertension    Osteoporosis    Vertigo     Past Surgical History:  Procedure  Laterality Date   ABDOMINAL HYSTERECTOMY     BREAST RECONSTRUCTION     capsulotomy with vaginal laser     cataract surgery     CHOLECYSTECTOMY     COLONOSCOPY     DILATION AND CURETTAGE OF UTERUS     MASTECTOMY     TONSILLECTOMY      No family history on file.  Social History   Social History Narrative   Not on file     Review of Systems General: Denies fevers or chills Skin: Endorses ulceration and redness  Physical Exam    04/26/2023   11:42 AM 04/16/2023   10:06 AM 04/16/2023    9:00 AM  Vitals with BMI  Systolic 103 114 409  Diastolic 59 78 67  Pulse 66 95 89    General:  No acute distress, nontoxic appearing  Respiratory: No increased work of breathing Neuro: Alert and oriented Psychiatric: Normal mood and affect  Breasts: Suspected ruptured gel implants bilaterally.  Superficial ulceration to center of reconstructed right breast with another area of ulceration a few centimeters inferiorly.  Mild surrounding erythema, but the skin does not feel indurated.  The color is significantly improved compared to how she was when she presented to the hospital last month.  Left breast has another area of ulceration on left lateral reconstructed breast.  The skin appears to be mildly raw, but the base of the wound appears healthy.  No induration.  Mildly erythematous.       Assessment/Plan  Ulcerations to bilateral reconstructed breasts:   The pathology from punch biopsies obtained while hospitalized revealed spongiotic dermatitis.  Suspect that while this may have some improvement with topical corticosteroids, do not feel as though that is necessarily an appropriate measure given how thin her skin already is over her reconstructed breasts.  After reviewing the CT, I can appreciate how close her implants are to extrusion and chronic topical corticosteroids may increase risk for implant exposure.  Will discuss case with Dr. Ladona Ridgel.  Discussed with patient and daughter that she  may benefit from implant removal given that they are completely ruptured and to help take pressure off of the skin.  However, concern is that she may have difficulty with poor wound healing postoperatively.  Will refer to dermatology to see if they can weigh in on her ulcerative lesions.  In the interim, we will prescribe the patient antibiotics as she did previously have cellulitis and they report this is new erythema that had previously resolved entirely.  There are numerous allergies.  The daughter reports that she had either kidney or liver injury from the Bactrim that was given during her hospitalization and the cephalosporin that was later prescribed caused her to break out in a rash.  Could consider clindamycin or a fluoroquinolone.  She states that she is taking fluoroquinolones in the past for UTIs without  complication.    Follow-up with Dr. Ladona Ridgel in 7 to 10 days.  They can call the office if they have any questions or concerns in interim.  Picture(s) obtained of the patient and placed in the chart were with the patient's or guardian's permission.   Evelena Leyden PA-C 04/26/2023, 1:41 PM

## 2023-04-28 NOTE — Addendum Note (Signed)
 Addended by: Evelena Leyden on: 04/28/2023 10:44 AM   Modules accepted: Orders

## 2023-05-06 ENCOUNTER — Ambulatory Visit (INDEPENDENT_AMBULATORY_CARE_PROVIDER_SITE_OTHER): Payer: Medicare Other | Admitting: Plastic Surgery

## 2023-05-06 ENCOUNTER — Encounter: Payer: Self-pay | Admitting: Plastic Surgery

## 2023-05-06 VITALS — BP 112/61 | HR 94 | Ht 62.0 in | Wt 195.0 lb

## 2023-05-06 DIAGNOSIS — L98499 Non-pressure chronic ulcer of skin of other sites with unspecified severity: Secondary | ICD-10-CM

## 2023-05-06 NOTE — Progress Notes (Signed)
 Dawn Roy returns today after hospitalization for erythema and ulceration of the bilateral breast.  While she was in the hospital she underwent skin biopsies of each breast which showed a spongiform dermatitis  Since her discharge she has been doing better.  She was placed on Cipro because of erythema of the breast but the erythema seems to have resolved.  On physical examination she still has some excoriated skin at the nipple reconstruction on the right and 2 small areas on the inframammary fold on the right.  She also has an excoriated area on the lateral portion of the left breast.  It is still unclear what exactly caused her dermatitis.  Her daughter and I agree with this feels that she is picking at the breast at night while she is sleeping.  I have recommended that Dawn Roy wear soft gloves at night and that they place bandages over the excoriated areas.  If the nonhealing ulcers are due to her picking at the wounds while she is sleeping this should show rapid improvement.  Follow-up with me in 1 to 2 weeks.

## 2023-05-07 NOTE — Procedures (Signed)
 Procedure Note  Preoperative Dx: Bilateral breast erythema with ulceration **procedure was performed on January 31 during the patient's hospitalization**  Postoperative Dx: Same  Procedure: Bilateral punch biopsies at bedside  Anesthesia: Lidocaine  1% with 1:100,000 epinephrine    Indication for Procedure: Biopsy for pathologic diagnosis of erythema  Description of Procedure: Risks and complications were explained to the patient including the possibility of infection of the implants.  Consent was confirmed and the patient understands the risks and benefits.  The potential complications and alternatives were explained and the patient consents.  The patient expressed understanding the option of not having the procedure and the risks of a scar.  Time out was called and all information was confirmed to be correct.    The area of greatest erythema and ulceration on the right breast was prepped and drapped.  Local anesthetic was injected in the subcutaneous tissues.  After waiting for the local to take affect a 5 mm punch biopsy was obtained.  After obtaining hemostasis, the surgical wound was closed with a simple 4-0 Monocryl suture.  The surgical wound measured 5 mm.  Attention was turned to the left breast where the area of greatest erythema and ulceration was again identified and anesthetized.  A 5 mm punch biopsy was obtained on this side as well.  The punch biopsy site was closed with a single 4-0 Monocryl suture.  A dressing was applied.   Duaa tolerated the procedure well and there were no complications. The specimen was sent to pathology.

## 2023-05-13 ENCOUNTER — Ambulatory Visit: Payer: Medicare Other | Admitting: Plastic Surgery

## 2023-05-19 ENCOUNTER — Ambulatory Visit: Admitting: Plastic Surgery

## 2023-05-19 DIAGNOSIS — L98499 Non-pressure chronic ulcer of skin of other sites with unspecified severity: Secondary | ICD-10-CM | POA: Diagnosis not present

## 2023-05-19 NOTE — Progress Notes (Signed)
 Dawn Roy returns today for evaluation.  She was admitted to the hospital for erythema the breast with ulcers over the breast.  She is doing much better now there is still some very superficial ulcerations on both breasts in various stages of healing.  There is no erythema over either breast.  She has been using the gloves at night but these often come off in the middle of the night and she still picks at the ulcers.  We discussed management.  At this time I do not feel that removing the implants is the appropriate thing to do.  I still would like to have her seen by dermatologist to comment on the biopsy findings.  They may also have different ideas for management of the ulcerations.  For now she will apply Vaseline to the ulcers and keep them covered is much as possible.  She will follow-up with me in 1 month sooner for any concerns

## 2023-06-17 ENCOUNTER — Ambulatory Visit: Admitting: Plastic Surgery

## 2023-06-24 NOTE — Telephone Encounter (Signed)
error 

## 2023-08-08 ENCOUNTER — Inpatient Hospital Stay (HOSPITAL_COMMUNITY)
Admission: EM | Admit: 2023-08-08 | Discharge: 2023-08-18 | DRG: 872 | Disposition: A | Discharge status: Skilled Nursing Facility | Attending: Internal Medicine | Admitting: Internal Medicine

## 2023-08-08 ENCOUNTER — Other Ambulatory Visit: Payer: Self-pay

## 2023-08-08 ENCOUNTER — Emergency Department (HOSPITAL_COMMUNITY)

## 2023-08-08 DIAGNOSIS — N390 Urinary tract infection, site not specified: Secondary | ICD-10-CM | POA: Diagnosis present

## 2023-08-08 DIAGNOSIS — I4819 Other persistent atrial fibrillation: Secondary | ICD-10-CM | POA: Diagnosis present

## 2023-08-08 DIAGNOSIS — F0394 Unspecified dementia, unspecified severity, with anxiety: Secondary | ICD-10-CM | POA: Diagnosis present

## 2023-08-08 DIAGNOSIS — I4821 Permanent atrial fibrillation: Secondary | ICD-10-CM | POA: Diagnosis present

## 2023-08-08 DIAGNOSIS — Z9882 Breast implant status: Secondary | ICD-10-CM | POA: Diagnosis not present

## 2023-08-08 DIAGNOSIS — F418 Other specified anxiety disorders: Secondary | ICD-10-CM | POA: Diagnosis present

## 2023-08-08 DIAGNOSIS — N61 Mastitis without abscess: Secondary | ICD-10-CM | POA: Diagnosis present

## 2023-08-08 DIAGNOSIS — L309 Dermatitis, unspecified: Secondary | ICD-10-CM | POA: Diagnosis present

## 2023-08-08 DIAGNOSIS — E871 Hypo-osmolality and hyponatremia: Secondary | ICD-10-CM | POA: Diagnosis not present

## 2023-08-08 DIAGNOSIS — Z9221 Personal history of antineoplastic chemotherapy: Secondary | ICD-10-CM

## 2023-08-08 DIAGNOSIS — Z6835 Body mass index (BMI) 35.0-35.9, adult: Secondary | ICD-10-CM

## 2023-08-08 DIAGNOSIS — L0390 Cellulitis, unspecified: Secondary | ICD-10-CM | POA: Diagnosis present

## 2023-08-08 DIAGNOSIS — A419 Sepsis, unspecified organism: Principal | ICD-10-CM | POA: Diagnosis present

## 2023-08-08 DIAGNOSIS — M1990 Unspecified osteoarthritis, unspecified site: Secondary | ICD-10-CM | POA: Diagnosis present

## 2023-08-08 DIAGNOSIS — G8929 Other chronic pain: Secondary | ICD-10-CM | POA: Diagnosis present

## 2023-08-08 DIAGNOSIS — D649 Anemia, unspecified: Secondary | ICD-10-CM | POA: Diagnosis present

## 2023-08-08 DIAGNOSIS — L308 Other specified dermatitis: Secondary | ICD-10-CM | POA: Diagnosis not present

## 2023-08-08 DIAGNOSIS — D72829 Elevated white blood cell count, unspecified: Secondary | ICD-10-CM | POA: Diagnosis not present

## 2023-08-08 DIAGNOSIS — Z85828 Personal history of other malignant neoplasm of skin: Secondary | ICD-10-CM | POA: Diagnosis not present

## 2023-08-08 DIAGNOSIS — E872 Acidosis, unspecified: Secondary | ICD-10-CM | POA: Diagnosis not present

## 2023-08-08 DIAGNOSIS — T8543XA Leakage of breast prosthesis and implant, initial encounter: Secondary | ICD-10-CM | POA: Diagnosis not present

## 2023-08-08 DIAGNOSIS — Z881 Allergy status to other antibiotic agents status: Secondary | ICD-10-CM

## 2023-08-08 DIAGNOSIS — Z66 Do not resuscitate: Secondary | ICD-10-CM | POA: Diagnosis present

## 2023-08-08 DIAGNOSIS — M810 Age-related osteoporosis without current pathological fracture: Secondary | ICD-10-CM | POA: Diagnosis present

## 2023-08-08 DIAGNOSIS — E66811 Obesity, class 1: Secondary | ICD-10-CM | POA: Diagnosis present

## 2023-08-08 DIAGNOSIS — Z79899 Other long term (current) drug therapy: Secondary | ICD-10-CM

## 2023-08-08 DIAGNOSIS — I5032 Chronic diastolic (congestive) heart failure: Secondary | ICD-10-CM | POA: Diagnosis present

## 2023-08-08 DIAGNOSIS — Z885 Allergy status to narcotic agent status: Secondary | ICD-10-CM

## 2023-08-08 DIAGNOSIS — F32A Depression, unspecified: Secondary | ICD-10-CM | POA: Diagnosis present

## 2023-08-08 DIAGNOSIS — F0393 Unspecified dementia, unspecified severity, with mood disturbance: Secondary | ICD-10-CM | POA: Diagnosis present

## 2023-08-08 DIAGNOSIS — Z9049 Acquired absence of other specified parts of digestive tract: Secondary | ICD-10-CM

## 2023-08-08 DIAGNOSIS — Z7901 Long term (current) use of anticoagulants: Secondary | ICD-10-CM | POA: Diagnosis not present

## 2023-08-08 DIAGNOSIS — Z880 Allergy status to penicillin: Secondary | ICD-10-CM

## 2023-08-08 DIAGNOSIS — L03818 Cellulitis of other sites: Secondary | ICD-10-CM | POA: Diagnosis not present

## 2023-08-08 DIAGNOSIS — Z751 Person awaiting admission to adequate facility elsewhere: Secondary | ICD-10-CM

## 2023-08-08 DIAGNOSIS — R531 Weakness: Secondary | ICD-10-CM | POA: Diagnosis present

## 2023-08-08 DIAGNOSIS — Z91041 Radiographic dye allergy status: Secondary | ICD-10-CM

## 2023-08-08 DIAGNOSIS — B9620 Unspecified Escherichia coli [E. coli] as the cause of diseases classified elsewhere: Secondary | ICD-10-CM | POA: Diagnosis present

## 2023-08-08 DIAGNOSIS — Z9013 Acquired absence of bilateral breasts and nipples: Secondary | ICD-10-CM | POA: Diagnosis not present

## 2023-08-08 DIAGNOSIS — Z888 Allergy status to other drugs, medicaments and biological substances status: Secondary | ICD-10-CM

## 2023-08-08 DIAGNOSIS — N611 Abscess of the breast and nipple: Secondary | ICD-10-CM | POA: Diagnosis not present

## 2023-08-08 DIAGNOSIS — I110 Hypertensive heart disease with heart failure: Secondary | ICD-10-CM | POA: Diagnosis present

## 2023-08-08 DIAGNOSIS — Z853 Personal history of malignant neoplasm of breast: Secondary | ICD-10-CM

## 2023-08-08 DIAGNOSIS — Z90710 Acquired absence of both cervix and uterus: Secondary | ICD-10-CM

## 2023-08-08 DIAGNOSIS — M25561 Pain in right knee: Secondary | ICD-10-CM | POA: Diagnosis present

## 2023-08-08 LAB — CBC WITH DIFFERENTIAL/PLATELET
Abs Immature Granulocytes: 0.03 10*3/uL (ref 0.00–0.07)
Basophils Absolute: 0.1 10*3/uL (ref 0.0–0.1)
Basophils Relative: 1 %
Eosinophils Absolute: 0.1 10*3/uL (ref 0.0–0.5)
Eosinophils Relative: 1 %
HCT: 39.9 % (ref 36.0–46.0)
Hemoglobin: 12.9 g/dL (ref 12.0–15.0)
Immature Granulocytes: 0 %
Lymphocytes Relative: 10 %
Lymphs Abs: 1.1 10*3/uL (ref 0.7–4.0)
MCH: 29.6 pg (ref 26.0–34.0)
MCHC: 32.3 g/dL (ref 30.0–36.0)
MCV: 91.5 fL (ref 80.0–100.0)
Monocytes Absolute: 0.8 10*3/uL (ref 0.1–1.0)
Monocytes Relative: 8 %
Neutro Abs: 8.4 10*3/uL — ABNORMAL HIGH (ref 1.7–7.7)
Neutrophils Relative %: 80 %
Platelets: 348 10*3/uL (ref 150–400)
RBC: 4.36 MIL/uL (ref 3.87–5.11)
RDW: 13.6 % (ref 11.5–15.5)
WBC: 10.4 10*3/uL (ref 4.0–10.5)
nRBC: 0 % (ref 0.0–0.2)

## 2023-08-08 LAB — PROTIME-INR
INR: 1.1 (ref 0.8–1.2)
Prothrombin Time: 14.7 s (ref 11.4–15.2)

## 2023-08-08 LAB — I-STAT CG4 LACTIC ACID, ED
Lactic Acid, Venous: 2.1 mmol/L (ref 0.5–1.9)
Lactic Acid, Venous: 1.2 mmol/L (ref 0.5–1.9)

## 2023-08-08 LAB — COMPREHENSIVE METABOLIC PANEL WITH GFR
ALT: 9 U/L (ref 0–44)
AST: 21 U/L (ref 15–41)
Albumin: 2.9 g/dL — ABNORMAL LOW (ref 3.5–5.0)
Alkaline Phosphatase: 57 U/L (ref 38–126)
Anion gap: 16 — ABNORMAL HIGH (ref 5–15)
BUN: 17 mg/dL (ref 8–23)
CO2: 23 mmol/L (ref 22–32)
Calcium: 9.1 mg/dL (ref 8.9–10.3)
Chloride: 98 mmol/L (ref 98–111)
Creatinine, Ser: 1.07 mg/dL — ABNORMAL HIGH (ref 0.44–1.00)
GFR, Estimated: 49 mL/min — ABNORMAL LOW (ref >60)
Glucose, Bld: 106 mg/dL — ABNORMAL HIGH (ref 70–99)
Potassium: 4 mmol/L (ref 3.5–5.1)
Sodium: 137 mmol/L (ref 135–145)
Total Bilirubin: 1.4 mg/dL — ABNORMAL HIGH (ref 0.0–1.2)
Total Protein: 6.9 g/dL (ref 6.5–8.1)

## 2023-08-08 LAB — URINALYSIS, W/ REFLEX TO CULTURE (INFECTION SUSPECTED)
Bilirubin Urine: NEGATIVE
Glucose, UA: NEGATIVE mg/dL
Ketones, ur: 5 mg/dL — AB
Nitrite: NEGATIVE
Protein, ur: NEGATIVE mg/dL
Specific Gravity, Urine: 1.017 (ref 1.005–1.030)
WBC, UA: >50 WBC/hpf (ref 0–5)
pH: 5 (ref 5.0–8.0)

## 2023-08-08 LAB — BRAIN NATRIURETIC PEPTIDE: B Natriuretic Peptide: 205.6 pg/mL — ABNORMAL HIGH (ref 0.0–100.0)

## 2023-08-08 MED ORDER — METOPROLOL TARTRATE 5 MG/5ML IV SOLN
2.5000 mg | Freq: Four times a day (QID) | INTRAVENOUS | Status: DC | PRN
  Administered 2023-08-09: 2.5 mg via INTRAVENOUS
  Filled 2023-08-08: qty 5

## 2023-08-08 MED ORDER — ACETAMINOPHEN 325 MG PO TABS
650.0000 mg | ORAL_TABLET | Freq: Four times a day (QID) | ORAL | Status: DC | PRN
  Administered 2023-08-15: 650 mg via ORAL
  Filled 2023-08-08: qty 2

## 2023-08-08 MED ORDER — APIXABAN 5 MG PO TABS
5.0000 mg | ORAL_TABLET | Freq: Two times a day (BID) | ORAL | Status: DC
  Administered 2023-08-08 – 2023-08-18 (×20): 5 mg via ORAL
  Filled 2023-08-08 (×21): qty 1

## 2023-08-08 MED ORDER — PROBIOTIC DAILY PO CAPS: ORAL_CAPSULE | Freq: Every day | ORAL | Status: DC

## 2023-08-08 MED ORDER — MELATONIN 5 MG PO TABS
5.0000 mg | ORAL_TABLET | Freq: Every evening | ORAL | Status: DC | PRN
  Administered 2023-08-10 – 2023-08-11 (×2): 5 mg via ORAL
  Filled 2023-08-08 (×2): qty 1

## 2023-08-08 MED ORDER — ENOXAPARIN SODIUM 40 MG/0.4ML IJ SOSY: 40.0000 mg | PREFILLED_SYRINGE | INTRAMUSCULAR | Status: DC

## 2023-08-08 MED ORDER — CITALOPRAM HYDROBROMIDE 20 MG PO TABS
20.0000 mg | ORAL_TABLET | Freq: Every day | ORAL | Status: DC
  Administered 2023-08-08 – 2023-08-18 (×11): 20 mg via ORAL
  Filled 2023-08-08 (×4): qty 1
  Filled 2023-08-08: qty 2
  Filled 2023-08-08 (×6): qty 1

## 2023-08-08 MED ORDER — OXYCODONE HCL 5 MG PO TABS
5.0000 mg | ORAL_TABLET | Freq: Four times a day (QID) | ORAL | Status: DC | PRN
  Administered 2023-08-09 – 2023-08-11 (×3): 5 mg via ORAL
  Filled 2023-08-08 (×3): qty 1

## 2023-08-08 MED ORDER — SODIUM CHLORIDE 0.9 % IV SOLN
2.0000 g | Freq: Two times a day (BID) | INTRAVENOUS | Status: DC
  Administered 2023-08-08 – 2023-08-09 (×3): 2 g via INTRAVENOUS
  Filled 2023-08-08 (×4): qty 12.5

## 2023-08-08 MED ORDER — POLYETHYLENE GLYCOL 3350 17 G PO PACK: 17.0000 g | PACK | Freq: Every day | ORAL | Status: DC | PRN

## 2023-08-08 MED ORDER — RISAQUAD PO CAPS
1.0000 | ORAL_CAPSULE | Freq: Every day | ORAL | Status: DC
  Administered 2023-08-09 – 2023-08-18 (×10): 1 via ORAL
  Filled 2023-08-08 (×11): qty 1

## 2023-08-08 MED ORDER — SODIUM CHLORIDE 0.9 % IV SOLN: INTRAVENOUS | Status: AC

## 2023-08-08 MED ORDER — VANCOMYCIN HCL 500 MG/100ML IV SOLN: 500.0000 mg | INTRAVENOUS | Status: DC

## 2023-08-08 MED ORDER — ONDANSETRON HCL 4 MG/2ML IJ SOLN: 4.0000 mg | Freq: Four times a day (QID) | INTRAMUSCULAR | Status: DC | PRN

## 2023-08-08 MED ORDER — VANCOMYCIN HCL 750 MG/150ML IV SOLN: 750.0000 mg | INTRAVENOUS | Status: DC

## 2023-08-08 MED ORDER — METOPROLOL SUCCINATE ER 25 MG PO TB24
12.5000 mg | ORAL_TABLET | Freq: Every day | ORAL | Status: DC
  Administered 2023-08-08 – 2023-08-18 (×11): 12.5 mg via ORAL
  Filled 2023-08-08 (×11): qty 1

## 2023-08-08 MED ORDER — VANCOMYCIN HCL IN DEXTROSE 1-5 GM/200ML-% IV SOLN
1000.0000 mg | Freq: Once | INTRAVENOUS | Status: AC
  Administered 2023-08-08: 1000 mg via INTRAVENOUS
  Filled 2023-08-08: qty 200

## 2023-08-08 MED ORDER — SODIUM CHLORIDE 0.9 % IV BOLUS
500.0000 mL | Freq: Once | INTRAVENOUS | Status: AC
Start: 2023-08-08 — End: 2023-08-08
  Administered 2023-08-08: 500 mL via INTRAVENOUS

## 2023-08-08 NOTE — ED Triage Notes (Signed)
 Patient arrives via [facility] for generalized weakness x4 days. Endorses dizziness currently, daughter states she is at baseline. Per EMS, dry oral mucosa, pale. Alert and oriented x4.   BP 178/90 HR 96 O2 98 on room air CBG 94 Temp 97.4  Patient is on eliquis .

## 2023-08-08 NOTE — ED Provider Notes (Signed)
 [place] EMERGENCY DEPARTMENT AT [facility] Provider Note   CSN: [id number] Arrival date & time: 08/08/23  1900     History  Chief Complaint  Patient presents with   Weakness    Dawn Roy is a 89 y.o. female.  HPI Patient presents with her daughter after arriving via EMS.  Patient has had weakness over the past 3 days.  Patient noted to have multiple medical issues including A-fib, she is anticoagulated.  She also has history of breast cancer in the distant past has been picking at scabs on her right breast and has had increasing erythema in the area.  Patient lives at home with family, typically gets herself up from bed, joins family, but for the past 3 days has been in bed, not eating, taking her medication.  No fall, trauma.  EMS reports no hemodynamic stability aside from tachycardia.    Home Medications Prior to Admission medications   Medication Sig Start Date End Date Taking? Authorizing Provider  apixaban  (ELIQUIS ) 5 MG TABS tablet Take 1 tablet (5 mg total) by mouth 2 (two) times daily. 12/28/22   Nathanel Bal, PA-C  cholecalciferol (VITAMIN D) 1000 UNITS tablet Take 1,000 Units by mouth daily.    [provider]  citalopram  (CELEXA ) 40 MG tablet Take 20 mg by mouth daily.    [provider]  diclofenac Sodium (VOLTAREN) 1 % GEL Apply 2 g topically 4 (four) times daily as needed (knee pain). 05/23/18   [provider]  furosemide  (LASIX ) 20 MG tablet Take 20 mg by mouth daily as needed (for stasis detrmatitis).    [provider]  gabapentin  (NEURONTIN ) 100 MG capsule Take 100 mg by mouth every evening.    [provider]  loratadine  (CLARITIN ) 10 MG tablet Take 10 mg by mouth daily.    [provider]  metoprolol  succinate (TOPROL -XL) 50 MG 24 hr tablet Take 1 tablet (50 mg total) by mouth daily. Take with or immediately following a meal. 12/28/22   Nathanel Bal, PA-C  Probiotic Product  (PROBIOTIC DAILY PO) Take 1 capsule by mouth daily. Culturelle    [provider]      Allergies    Azithromycin, Dalmane [flurazepam], Demerol [meperidine], Felodipine, Fosamax [alendronate sodium], Hctz [hydrochlorothiazide], Ivp dye [iodinated contrast media], Keflin [cephalothin], Macrodantin [nitrofurantoin macrocrystal], Morphine and codeine, Phenergan [promethazine hcl], Valium [diazepam], Doxycycline , Keflex [cephalexin], Neosporin [neomycin-bacitracin zn-polymyx], and Penicillins    Review of Systems   Review of Systems  Physical Exam Updated Vital Signs BP 130/75   Pulse (!) 114   Temp 97.9 F (36.6 C) (Oral)   Resp 18   Ht 5\' 2"  (1.575 m)   Wt 88.5 kg   SpO2 99%   BMI 35.69 kg/m  Physical Exam Vitals and nursing note reviewed. Exam conducted with a chaperone present.  Constitutional:      General: She is not in acute distress.    Appearance: She is well-developed.  HENT:     Head: Normocephalic and atraumatic.  Eyes:     Conjunctiva/sclera: Conjunctivae normal.  Cardiovascular:     Rate and Rhythm: Tachycardia present. Rhythm irregular.  Pulmonary:     Effort: Pulmonary effort is normal. No respiratory distress.     Breath sounds: Normal breath sounds. No stridor.  Chest:    Abdominal:     General: There is no distension.  Skin:    General: Skin is warm and dry.  Neurological:  Mental Status: She is alert and oriented to person, place, and time.     Cranial Nerves: No cranial nerve deficit.  Psychiatric:        Mood and Affect: Mood normal.     ED Results / Procedures / Treatments   Labs (all labs ordered are listed, but only abnormal results are displayed) Labs Reviewed  COMPREHENSIVE METABOLIC PANEL WITH GFR - Abnormal; Notable for the following components:      Result Value   Glucose, Bld 106 (*)    Creatinine, Ser 1.07 (*)    Albumin 2.9 (*)    Total Bilirubin 1.4 (*)    GFR, Estimated 49 (*)    Anion gap 16 (*)    All other  components within normal limits  CBC WITH DIFFERENTIAL/PLATELET - Abnormal; Notable for the following components:   Neutro Abs 8.4 (*)    All other components within normal limits  I-STAT CG4 LACTIC ACID, ED - Abnormal; Notable for the following components:   Lactic Acid, Venous 2.1 (*)    All other components within normal limits  PROTIME-INR  URINALYSIS, W/ REFLEX TO CULTURE (INFECTION SUSPECTED)  BRAIN NATRIURETIC PEPTIDE  CBC  BASIC METABOLIC PANEL WITH GFR  MAGNESIUM   PHOSPHORUS  I-STAT CG4 LACTIC ACID, ED    EKG EKG Interpretation Date/Time:  Sunday August 08 2023 19:09:57 EDT Ventricular Rate:  124 PR Interval:    QRS Duration:  78 QT Interval:  322 QTC Calculation: 463 R Axis:   66  Text Interpretation: Atrial fibrillation Repolarization abnormality, prob rate related Confirmed by Dorenda Gandy [phone]) on 08/08/2023 7:19:48 PM  Radiology DG Chest 2 View Result Date: 08/08/2023 CLINICAL DATA:  Generalized weakness for 4 days.  Dizziness. EXAM: CHEST - 2 VIEW COMPARISON:  Chest radiograph 12/09/2022.  CT chest 04/07/2023 FINDINGS: Shallow inspiration. Mild cardiac enlargement. No vascular congestion. Peribronchial thickening and bronchiectasis consistent with chronic bronchitis. Mild increased density over the right lung base is likely due to soft tissue attenuation. No definite airspace disease or consolidation in the lungs. No pleural effusion or pneumothorax. Mediastinal contours appear intact. Degenerative changes in the spine and shoulders. IMPRESSION: Mild cardiac enlargement. Chronic bronchitic changes in the lungs. No focal consolidation. Electronically Signed   By: Boyce Byes M.D.   On: 08/08/2023 19:51    Procedures Procedures    Medications Ordered in ED Medications  vancomycin  (VANCOCIN ) IVPB 1000 mg/200 mL premix (1,000 mg Intravenous New Bag/Given 08/08/23 2042)  enoxaparin  (LOVENOX ) injection 40 mg (has no administration in time range)  acetaminophen   (TYLENOL ) tablet 650 mg (has no administration in time range)  melatonin tablet 5 mg (has no administration in time range)  ondansetron (ZOFRAN) injection 4 mg (has no administration in time range)  polyethylene glycol (MIRALAX  / GLYCOLAX ) packet 17 g (has no administration in time range)  0.9 %  sodium chloride  infusion (has no administration in time range)  vancomycin  (VANCOREADY) IVPB 750 mg/150 mL (has no administration in time range)  sodium chloride  0.9 % bolus 500 mL (0 mLs Intravenous Stopped 08/08/23 2001)    ED Course/ Medical Decision Making/ A&P                                 Medical Decision Making Geriatric female with A-fib, prior breast cancer presents with weakness.  Patient is awake, alert, afebrile.  She is tachycardic, with A-fib, possibly secondary to infection versus medication noncompliance due to  her weakness over the past few days.  Given the duration of time since onset of weakness, with no anticoagulation use patient not a candidate for cardioversion.  Labs x-ray monitoring commenced. Cardiac 120s A-fib abnormal pulse ox 99% room air normal  Amount and/or Complexity of Data Reviewed Independent Historian:     Details: Daughter at bedside Labs: ordered. Decision-making details documented in ED Course. Radiology: ordered and independent interpretation performed. Decision-making details documented in ED Course. ECG/medicine tests: ordered and independent interpretation performed. Decision-making details documented in ED Course.  Risk Prescription drug management. Decision regarding hospitalization.   8:53 PM On repeat exam patient in similar condition.  Patient remains tachycardic, heart rate variable 110, 115, though as above, patient has not been taking her medication due to her illness. Patient with mild lactic acidosis, but no leukocytosis, no fever.  She meets SIRS criteria, does not have evidence for sepsis.  Given concern for cellulitis, SIRS, patient has  started vancomycin  will be admitted for further monitoring, management.        Final Clinical Impression(s) / ED Diagnoses Final diagnoses:  Cellulitis of right breast     Dorenda Gandy, MD 08/08/23 2054

## 2023-08-08 NOTE — H&P (Addendum)
 History and Physical  Dawn Roy [id number]:[id number] DOB: 16-Oct-1929 DOA: 08/08/2023  Referring physician: Dr. Liam Redhead, EDP. PCP: Arva Lathe, MD  Outpatient Specialists: Cardiology, plastic surgery. Patient coming from: Home.  Chief Complaint: Generalized weakness and right lateral breast redness.  HPI: Dawn Roy is a 89 y.o. female with medical history significant for breast cancer status post bilateral mastectomy followed by gel implant in 1991, permanent atrial fibrillation on Eliquis , chronic anxiety/depression, hypertension, history of cellulitis involving bilateral breast silicone gel implants with admission from 04/07/2023 through 04/16/2023, history of picking at her breasts skin, who presents today with complaints of generalized weakness and right breast redness, swelling, warmth, tenderness.  Her symptoms are worsening for the past few days.  The patient admits to picking at her breasts even in her sleep despite wearing gloves.    In the ER, tachycardic and tachypneic.  Afebrile with no leukocytosis.  On physical exam, findings consistent with right breast cellulitis.  The patient was started on empiric IV antibiotics.  Admitted by [facility], hospitalist service.  At the time of this visit, the patient is alert and oriented.  Her daughter is present at bedside.  Ordered bilateral breast MRI to rule out an abscess or abscesses.  ED Course: Temperature 97.9.  BP 130/75, pulse 121, respiration rate 20, O2 saturation 99% on room air.  Review of Systems: Review of systems as noted in the HPI. All other systems reviewed and are negative.   Past Medical History:  Diagnosis Date   Arthritis    Basal cell carcinoma    Breast cancer (HCC)    Depression    Hypertension    Osteoporosis    Vertigo    Past Surgical History:  Procedure Laterality Date   ABDOMINAL HYSTERECTOMY     BREAST RECONSTRUCTION     capsulotomy with vaginal laser     cataract surgery      CHOLECYSTECTOMY     COLONOSCOPY     DILATION AND CURETTAGE OF UTERUS     MASTECTOMY     TONSILLECTOMY      Social History:  reports that she has never smoked. She has never used smokeless tobacco. She reports that she does not drink alcohol and does not use drugs.   Allergies  Allergen Reactions   Azithromycin Hives   Dalmane [Flurazepam] Other (See Comments)    unknown   Demerol [Meperidine] Other (See Comments)    Unknown    Felodipine Other (See Comments)    unknown   Fosamax [Alendronate Sodium] Other (See Comments)    unknown   Hctz [Hydrochlorothiazide] Other (See Comments)    Unknown    Ivp Dye [Iodinated Contrast Media] Other (See Comments)    unknown   Keflin [Cephalothin] Other (See Comments)    unknown   Macrodantin [Nitrofurantoin Macrocrystal] Other (See Comments)    unknown   Morphine And Codeine Other (See Comments)    unknown   Phenergan [Promethazine Hcl] Other (See Comments)    unknown   Valium [Diazepam] Other (See Comments)    unknown   Doxycycline  Rash    headache   Keflex [Cephalexin] Rash   Neosporin [Neomycin-Bacitracin Zn-Polymyx] Rash   Penicillins Rash    Family history: None reported  Prior to Admission medications   Medication Sig Start Date End Date Taking? Authorizing Provider  apixaban  (ELIQUIS ) 5 MG TABS tablet Take 1 tablet (5 mg total) by mouth 2 (two) times daily. 12/28/22   Nathanel Bal, PA-C  cholecalciferol (VITAMIN D)  1000 UNITS tablet Take 1,000 Units by mouth daily.    [provider]  citalopram  (CELEXA ) 40 MG tablet Take 20 mg by mouth daily.    [provider]  diclofenac Sodium (VOLTAREN) 1 % GEL Apply 2 g topically 4 (four) times daily as needed (knee pain). 05/23/18   [provider]  furosemide  (LASIX ) 20 MG tablet Take 20 mg by mouth daily as needed (for stasis detrmatitis).    [provider]  gabapentin  (NEURONTIN ) 100 MG capsule Take 100 mg by mouth every evening.     [provider]  loratadine  (CLARITIN ) 10 MG tablet Take 10 mg by mouth daily.    [provider]  metoprolol  succinate (TOPROL -XL) 50 MG 24 hr tablet Take 1 tablet (50 mg total) by mouth daily. Take with or immediately following a meal. 12/28/22   Nathanel Bal, PA-C  Probiotic Product (PROBIOTIC DAILY PO) Take 1 capsule by mouth daily. Culturelle    [provider]    Physical Exam: BP 130/75   Pulse (!) 114   Temp 97.9 F (36.6 C) (Oral)   Resp 18   Ht 5\' 2"  (1.575 m)   Wt 88.5 kg   SpO2 99%   BMI 35.69 kg/m   General: 89 y.o. year-old female well developed well nourished in no acute distress.  Alert and oriented x3. Cardiovascular: Regular rate and rhythm with no rubs or gallops.  No thyromegaly or JVD noted.  No lower extremity edema. 2/4 pulses in all 4 extremities. Respiratory: Clear to auscultation with no wheezes or rales. Good inspiratory effort. Abdomen: Soft nontender nondistended with normal bowel sounds x4 quadrants. Muskuloskeletal: No cyanosis, clubbing or edema noted bilaterally Neuro: CN II-XII intact, strength, sensation, reflexes Skin: Findings consistent with Right breast cellulitis. Psychiatry: Judgement and insight appear normal. Mood is appropriate for condition and setting          Labs on Admission:  Basic Metabolic Panel: Recent Labs  Lab 08/08/23 1925  NA 137  K 4.0  CL 98  CO2 23  GLUCOSE 106*  BUN 17  CREATININE 1.07*  CALCIUM 9.1   Liver Function Tests: Recent Labs  Lab 08/08/23 1925  AST 21  ALT 9  ALKPHOS 57  BILITOT 1.4*  PROT 6.9  ALBUMIN 2.9*   No results for input(s): "LIPASE", "AMYLASE" in the last 168 hours. No results for input(s): "AMMONIA" in the last 168 hours. CBC: Recent Labs  Lab 08/08/23 1925  WBC 10.4  NEUTROABS 8.4*  HGB 12.9  HCT 39.9  MCV 91.5  PLT 348   Cardiac Enzymes: No results for input(s): "CKTOTAL", "CKMB", "CKMBINDEX", "TROPONINI" in the last 168 hours.  BNP  (last 3 results) Recent Labs    12/09/22 1515  BNP 228.9*    ProBNP (last 3 results) No results for input(s): "PROBNP" in the last 8760 hours.  CBG: No results for input(s): "GLUCAP" in the last 168 hours.  Radiological Exams on Admission: DG Chest 2 View Result Date: 08/08/2023 CLINICAL DATA:  Generalized weakness for 4 days.  Dizziness. EXAM: CHEST - 2 VIEW COMPARISON:  Chest radiograph 12/09/2022.  CT chest 04/07/2023 FINDINGS: Shallow inspiration. Mild cardiac enlargement. No vascular congestion. Peribronchial thickening and bronchiectasis consistent with chronic bronchitis. Mild increased density over the right lung base is likely due to soft tissue attenuation. No definite airspace disease or consolidation in the lungs. No pleural effusion or pneumothorax. Mediastinal contours appear intact. Degenerative changes in the spine and shoulders. IMPRESSION: Mild cardiac  enlargement. Chronic bronchitic changes in the lungs. No focal consolidation. Electronically Signed   By: Boyce Byes M.D.   On: 08/08/2023 19:51    EKG: I independently viewed the EKG done and my findings are as followed: Atrial fibrillation rate of 124.  Nonspecific ST changes.  QTc 463.  Assessment/Plan Present on Admission:  Cellulitis  Principal Problem:   Cellulitis  Sepsis 2/2 right breast cellulitis, POA Tachycardia, tachypnea, R breast cellulitis Did not receive aggressive IV fluid hydration to avoid volume overload and respiratory distress. Lactic acid 2.1, trend Recent admission for the same, with previous involvement of bilateral breast implant Follows with plastic surgery outpatient Continue broad-spectrum IV antibiotics IV vancomycin . IV Cefepime added due to the severity of the R breast silicone gel implant cellulitis Follow peripheral blood cultures x 2 Follow MRI breast to rule out abscess or abscesses Infectious disease consulted due to concern for recurrent cellulitis Will need close  follow-up appointment with her plastic surgeon  Persistent atrial fibrillation with mild RVR Resume home Toprol -XL and Eliquis  Closely monitor on telemetry IV Lopressor  PRN with parameters  Chronic HFpEF Last 2D echo done on 12/10/2022 revealed LVEF 60 to 65%. Euvolemic on exam Closely monitor volume status while on IV hydration NS 75 cc/h x 1 day. Monitor strict I's and O's and daily weight  Chronic anxiety/depression Resume home Celexa   Obesity BMI 35 Recommend weight loss outpatient Follow-up with primary care provider  Generalized weakness PT OT evaluation Fall precautions.   Critical care time: 55 minutes.   DVT prophylaxis: Home Eliquis .  Code Status: DNR/DNI.  Family Communication: Updated the patient's daughter at bedside.  Disposition Plan: Admitted to telemetry medical unit.  Consults called: Infectious Disease, Dr Shereen Dike.  Admission status: Inpatient status.   Status is: Inpatient The patient requires at least 2 midnights for further evaluation and treatment of present condition.   Bary Boss MD [facility] Pager [phone]  If 7PM-7AM, please contact night-coverage [email or web address] Password [facility]  08/08/2023, 8:45 PM

## 2023-08-08 NOTE — Progress Notes (Signed)
 Pharmacy Antibiotic Note  Dawn Roy is a 89 y.o. female admitted on 08/08/2023 presenting with wound infection from breast cellulitis.  Pharmacy has been consulted for cefepime and vancomycin  dosing.    Plan: Vancomycin  500 mg IV q 24h (eAUC 427) Cefepime 2g IV q 12h Monitor renal function, Cx and clinical progression to narrow Vancomycin  levels as indicated  Height: 5\' 2"  (157.5 cm) Weight: 88.5 kg (195 lb 1.7 oz) IBW/kg (Calculated) : 50.1  Temp (24hrs), Avg:97.9 F (36.6 C), Min:97.9 F (36.6 C), Max:97.9 F (36.6 C)  Recent Labs  Lab 08/08/23 1925 08/08/23 1941  WBC 10.4  --   CREATININE 1.07*  --   LATICACIDVEN  --  2.1*    Estimated Creatinine Clearance: 34.7 mL/min (A) (by C-G formula based on SCr of 1.07 mg/dL (H)).    Allergies  Allergen Reactions   Azithromycin Hives   Dalmane [Flurazepam] Other (See Comments)    unknown   Demerol [Meperidine] Other (See Comments)    Unknown    Felodipine Other (See Comments)    unknown   Fosamax [Alendronate Sodium] Other (See Comments)    unknown   Hctz [Hydrochlorothiazide] Other (See Comments)    Unknown    Ivp Dye [Iodinated Contrast Media] Other (See Comments)    unknown   Keflin [Cephalothin] Other (See Comments)    unknown   Macrodantin [Nitrofurantoin Macrocrystal] Other (See Comments)    unknown   Morphine And Codeine Other (See Comments)    unknown   Phenergan [Promethazine Hcl] Other (See Comments)    unknown   Valium [Diazepam] Other (See Comments)    unknown   Doxycycline  Rash    headache   Keflex [Cephalexin] Rash   Neosporin [Neomycin-Bacitracin Zn-Polymyx] Rash   Penicillins Rash    Trinidad Funk, PharmD, [facility] Clinical Pharmacist ED Pharmacist Phone # [phone] 08/08/2023 9:01 PM

## 2023-08-09 ENCOUNTER — Encounter (HOSPITAL_COMMUNITY): Payer: Self-pay | Admitting: Internal Medicine

## 2023-08-09 DIAGNOSIS — L03818 Cellulitis of other sites: Secondary | ICD-10-CM

## 2023-08-09 DIAGNOSIS — L309 Dermatitis, unspecified: Secondary | ICD-10-CM

## 2023-08-09 DIAGNOSIS — Z9882 Breast implant status: Secondary | ICD-10-CM | POA: Diagnosis not present

## 2023-08-09 DIAGNOSIS — L308 Other specified dermatitis: Secondary | ICD-10-CM | POA: Diagnosis not present

## 2023-08-09 DIAGNOSIS — N61 Mastitis without abscess: Secondary | ICD-10-CM | POA: Diagnosis not present

## 2023-08-09 LAB — PHOSPHORUS: Phosphorus: 3 mg/dL (ref 2.5–4.6)

## 2023-08-09 LAB — BASIC METABOLIC PANEL WITH GFR
Anion gap: 11 (ref 5–15)
BUN: 19 mg/dL (ref 8–23)
CO2: 22 mmol/L (ref 22–32)
Calcium: 8.8 mg/dL — ABNORMAL LOW (ref 8.9–10.3)
Chloride: 102 mmol/L (ref 98–111)
Creatinine, Ser: 0.92 mg/dL (ref 0.44–1.00)
GFR, Estimated: 58 mL/min — ABNORMAL LOW (ref >60)
Glucose, Bld: 87 mg/dL (ref 70–99)
Potassium: 4 mmol/L (ref 3.5–5.1)
Sodium: 135 mmol/L (ref 135–145)

## 2023-08-09 LAB — CBC
HCT: 38.1 % (ref 36.0–46.0)
Hemoglobin: 12.2 g/dL (ref 12.0–15.0)
MCH: 29.2 pg (ref 26.0–34.0)
MCHC: 32 g/dL (ref 30.0–36.0)
MCV: 91.1 fL (ref 80.0–100.0)
Platelets: 312 10*3/uL (ref 150–400)
RBC: 4.18 MIL/uL (ref 3.87–5.11)
RDW: 13.6 % (ref 11.5–15.5)
WBC: 10.7 10*3/uL — ABNORMAL HIGH (ref 4.0–10.5)
nRBC: 0 % (ref 0.0–0.2)

## 2023-08-09 LAB — MAGNESIUM: Magnesium: 1.8 mg/dL (ref 1.7–2.4)

## 2023-08-09 MED ORDER — DAPTOMYCIN-SODIUM CHLORIDE 500-0.9 MG/50ML-% IV SOLN
8.0000 mg/kg | Freq: Every day | INTRAVENOUS | Status: DC
  Administered 2023-08-09 – 2023-08-12 (×4): 500 mg via INTRAVENOUS
  Filled 2023-08-09 (×4): qty 50

## 2023-08-09 NOTE — Progress Notes (Signed)
 Progress Note   Patient: Dawn Roy [id number]:[id number] DOB: May 09, 1929 DOA: 08/08/2023  DOS: the patient was seen and examined on 08/09/2023   Brief hospital course:  89 y.o. female with medical history significant for breast cancer status post bilateral mastectomy followed by gel implant in 1991, permanent atrial fibrillation on Eliquis , chronic anxiety/depression, hypertension, history of cellulitis involving bilateral breast silicone gel implants with admission from 04/07/2023 through 04/16/2023, history of picking at her breasts skin, who presents today with complaints of generalized weakness and right breast redness, swelling, warmth, tenderness.  Her symptoms are worsening for the past few days.  The patient admits to picking at her breasts even in her sleep despite wearing gloves.    Assessment and Plan:  Concern for sepsis - Tachycardia, tachypnea, elevated lactic acidosis with right breast cellulitis low Concern for sepsis on presentation.  Given aggressive IV fluid hydration, blood cultures, empiric antibiotics.  Lactate resolved.  Monitor blood pressure closely.  Right breast cellulitis - History of recurrent cellulitis with previous hospitalizations.  Reportedly patient admits to picking/scratching at her breasts even in her sleep.  Attempts to wear gloves/mitts have not resolved issue.  Recent admission for same, previous involving bilateral breast with concern for implant infection.  Follows with plastic surgery in the outpatient setting.  Empirically on broad-spectrum antibiotic coverage with cefepime plus vancomycin .  MRI pending to evaluate for abscess or peri-implant infection.  If present, would need evaluation by plastic surgeon.  Persistent atrial fibrillation - Resume metoprolol  XL and Eliquis .  Telemetry on board.  Chronic HFpEF - Does not not appear to be in acute exacerbation.  Euvolemic.  No hypoxia.  Chronic anxiety/depression - Resume home Celexa .  Physical  debilitation muscle weakness - PT/OT pending.  Subjective: Patient sitting up in bed this morning, daughter at bedside.  Currently denies any worsening shortness of breath, chest pain, nausea, vomiting, abdominal pain.  Patient mildly confused, but appears to be at baseline.  Physical Exam:  Vitals:   08/09/23 0230 08/09/23 0258 08/09/23 0440 08/09/23 1000  BP: (!) 145/93  (!) 153/61 (!) 116/57  Pulse:   90 100  Resp: 17 19  20   Temp:   98 F (36.7 C) 97.9 F (36.6 C)  TempSrc:   Oral Oral  SpO2:   98%   Weight:   80 kg   Height:   5\' 2"  (1.575 m)     GENERAL:  Alert, pleasant, no acute distress  HEENT:  EOMI CARDIOVASCULAR:  RRR, no murmurs appreciated RESPIRATORY:  Clear to auscultation, no wheezing, rales, or rhonchi GASTROINTESTINAL:  Soft, nontender, nondistended EXTREMITIES:  No LE edema bilaterally NEURO:  No new focal deficits appreciated SKIN:  No rashes noted PSYCH:  Appropriate mood and affect     Data Reviewed:  Awaiting MRI.  Previous records (including but not limited to H&P, progress notes, nursing notes, TOC management) were reviewed in assessment of this patient.  Labs: CBC: Recent Labs  Lab 08/08/23 1925 08/09/23 0550  WBC 10.4 10.7*  NEUTROABS 8.4*  --   HGB 12.9 12.2  HCT 39.9 38.1  MCV 91.5 91.1  PLT 348 312   Basic Metabolic Panel: Recent Labs  Lab 08/08/23 1925 08/09/23 0550  NA 137 135  K 4.0 4.0  CL 98 102  CO2 23 22  GLUCOSE 106* 87  BUN 17 19  CREATININE 1.07* 0.92  CALCIUM 9.1 8.8*  MG  --  1.8  PHOS  --  3.0   Liver Function Tests: Recent  Labs  Lab 08/08/23 1925  AST 21  ALT 9  ALKPHOS 57  BILITOT 1.4*  PROT 6.9  ALBUMIN 2.9*   CBG: No results for input(s): "GLUCAP" in the last 168 hours.  Scheduled Meds:  acidophilus  1 capsule Oral Daily   apixaban   5 mg Oral BID   citalopram   20 mg Oral Daily   metoprolol  succinate  12.5 mg Oral Daily   Continuous Infusions:  sodium chloride  75 mL/hr at 08/09/23  1049   ceFEPime (MAXIPIME) IV 2 g (08/09/23 1050)   vancomycin      PRN Meds:.acetaminophen , melatonin, metoprolol  tartrate, ondansetron (ZOFRAN) IV, oxyCODONE, polyethylene glycol  Family Communication: Daughter at bedside  Disposition: Status is: Inpatient Remains inpatient appropriate because: Cellulitis requiring IV antibiotics     Time spent: 38 minutes  Length of inpatient stay: 1 days  Author: Jodeane Mulligan, DO 08/09/2023 12:50 PM  For on call review [email or web address].

## 2023-08-09 NOTE — Consult Note (Signed)
 [facility]  Total days of antibiotics 2       Reason for Consult: breast cellulitis   Referring Physician: calkins  Principal Problem:   Cellulitis    HPI: Dawn Roy is a 89 y.o. female with remote hx of breast cancer s/p chemo and bilateral mastectomy. She then had reconstructive surgery with gel implants in 1981. In December 2024, started to notice wounds about her right breast and then also involving left breast. 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 PAS stain for fungi was negative. At htat visit, she was initially started on iv meropenem , then transitioned to bactrim . She had developed rash to cefadroxil  which improved with benadryl . She was discharged on topical hydrocortisone  cream to see if that would improve her skin condition. She was readmitted on 6/1 with worsening redness and tenderness. In the ED she is afebrile and WBC is WNL. Her right breast has shallow ulcers and widespread erythema no purulent drainage.awaiting MRI. She was started on vanco and cefepime.  Past Medical History:  Diagnosis Date   Arthritis    Basal cell carcinoma    Breast cancer (HCC)    Depression    Hypertension    Osteoporosis    Vertigo     Allergies:  Allergies  Allergen Reactions   Azithromycin Hives   Dalmane [Flurazepam] Other (See Comments)    unknown   Demerol [Meperidine] Other (See Comments)    Unknown    Felodipine Other (See Comments)    unknown   Fosamax [Alendronate Sodium] Other (See Comments)    unknown   Hctz [Hydrochlorothiazide] Other (See Comments)    Unknown    Ivp Dye [Iodinated Contrast Media] Other (See Comments)    unknown   Keflin [Cephalothin]  Other (See Comments)    unknown   Macrodantin [Nitrofurantoin Macrocrystal] Other (See Comments)    unknown   Morphine And Codeine Other (See Comments)    unknown   Phenergan [Promethazine Hcl] Other (See Comments)    unknown   Valium [Diazepam] Other (See Comments)    unknown   Doxycycline  Rash    headache   Keflex [Cephalexin] Rash   Neosporin [Neomycin-Bacitracin Zn-Polymyx] Rash   Penicillins Rash    Current antibiotics:   MEDICATIONS:  acidophilus  1 capsule Oral Daily   apixaban   5 mg Oral BID   citalopram   20 mg Oral Daily   metoprolol  succinate  12.5 mg Oral Daily    Social History   Tobacco Use   Smoking status: Never   Smokeless tobacco: Never  Vaping Use   Vaping status: Never Used  Substance Use Topics   Alcohol use: No   Drug use: No    No family history on file.  Review of Systems -  12 point ros is negative except what is mentioned in hpi  OBJECTIVE: Temp:  [97.8 F (36.6 C)-98 F (36.7 C)] 97.9 F (36.6 C) (06/02 1000) Pulse Rate:  [90-120] 100 (06/02 1000) Resp:  [16-26] 20 (06/02 1000) BP: (109-153)/(46-99) 116/57 (06/02 1000) SpO2:  [96 %-99 %] 98 % (06/02 0440) Weight:  [80 kg] 80 kg (06/02 0440) Physical Exam  Constitutional:  oriented to person, place, and time. appears well-developed and well-nourished. No distress.  HENT: [place]/AT, PERRLA, no  scleral icterus Mouth/Throat: Oropharynx is clear and moist. No oropharyngeal exudate.  Cardiovascular: Normal rate, regular rhythm and normal heart sounds. Exam reveals no gallop and no friction rub.  No murmur heard.  Skin =right breast erythema, see pictures Pulmonary/Chest: Effort normal and breath sounds normal. No respiratory distress.  has no wheezes.  Neck = supple, no nuchal rigidity Abdominal: Soft. Bowel sounds are normal.  exhibits no distension. There is no tenderness.  Lymphadenopathy: no cervical adenopathy. No axillary adenopathy Neurological: alert and oriented to person,  place, and time.  Skin: Skin is warm and dry. No rash noted. No erythema.  Psychiatric: a normal mood and affect.  behavior is normal.    LABS: Results for orders placed or performed during the hospital encounter of 08/08/23 (from the past 48 hours)  Comprehensive metabolic panel     Status: Abnormal   Collection Time: 08/08/23  7:25 PM  Result Value Ref Range   Sodium 137 135 - 145 mmol/L   Potassium 4.0 3.5 - 5.1 mmol/L   Chloride 98 98 - 111 mmol/L   CO2 23 22 - 32 mmol/L   Glucose, Bld 106 (H) 70 - 99 mg/dL    Comment: Glucose reference range applies only to samples taken after fasting for at least 8 hours.   BUN 17 8 - 23 mg/dL   Creatinine, Ser 2.53 (H) 0.44 - 1.00 mg/dL   Calcium 9.1 8.9 - 66.4 mg/dL   Total Protein 6.9 6.5 - 8.1 g/dL   Albumin 2.9 (L) 3.5 - 5.0 g/dL   AST 21 15 - 41 U/L   ALT 9 0 - 44 U/L   Alkaline Phosphatase 57 38 - 126 U/L   Total Bilirubin 1.4 (H) 0.0 - 1.2 mg/dL   GFR, Estimated 49 (L) >60 mL/min    Comment: (NOTE) Calculated using the CKD-EPI Creatinine Equation (2021)    Anion gap 16 (H) 5 - 15    Comment: Performed at [facility], [address] [place], [place] [address]  CBC with Differential     Status: Abnormal   Collection Time: 08/08/23  7:25 PM  Result Value Ref Range   WBC 10.4 4.0 - 10.5 K/uL   RBC 4.36 3.87 - 5.11 MIL/uL   Hemoglobin 12.9 12.0 - 15.0 g/dL   HCT 42.5 95.6 - 38.7 %   MCV 91.5 80.0 - 100.0 fL   MCH 29.6 26.0 - 34.0 pg   MCHC 32.3 30.0 - 36.0 g/dL   RDW 56.4 33.2 - 95.1 %   Platelets 348 150 - 400 K/uL   nRBC 0.0 0.0 - 0.2 %   Neutrophils Relative % 80 %   Neutro Abs 8.4 (H) 1.7 - 7.7 K/uL   Lymphocytes Relative 10 %   Lymphs Abs 1.1 0.7 - 4.0 K/uL   Monocytes Relative 8 %   Monocytes Absolute 0.8 0.1 - 1.0 K/uL   Eosinophils Relative 1 %   Eosinophils Absolute 0.1 0.0 - 0.5 K/uL   Basophils Relative 1 %   Basophils Absolute 0.1 0.0 - 0.1 K/uL   Immature Granulocytes 0 %   Abs Immature Granulocytes  0.03 0.00 - 0.07 K/uL    Comment: Performed at [facility], [address] [place], [place] [address]  Protime-INR     Status: None   Collection Time: 08/08/23  7:25 PM  Result Value Ref Range   Prothrombin Time 14.7 11.4 - 15.2 seconds   INR 1.1 0.8 - 1.2  Comment: (NOTE) INR goal varies based on device and disease states. Performed at [facility], [address], [place], [place] [address]   Brain natriuretic peptide     Status: Abnormal   Collection Time: 08/08/23  7:25 PM  Result Value Ref Range   B Natriuretic Peptide 205.6 (H) 0.0 - 100.0 pg/mL    Comment: Performed at [facility], [address] [place], [place] [address]  I-Stat Lactic Acid     Status: Abnormal   Collection Time: 08/08/23  7:41 PM  Result Value Ref Range   Lactic Acid, Venous 2.1 (HH) 0.5 - 1.9 mmol/L   Comment NOTIFIED PHYSICIAN   Urinalysis, w/ Reflex to Culture (Infection Suspected) -Urine, Clean Catch     Status: Abnormal   Collection Time: 08/08/23  8:34 PM  Result Value Ref Range   Specimen Source URINE, CLEAN CATCH    Color, Urine YELLOW YELLOW   APPearance CLOUDY (A) CLEAR   Specific Gravity, Urine 1.017 1.005 - 1.030   pH 5.0 5.0 - 8.0   Glucose, UA NEGATIVE NEGATIVE mg/dL   Hgb urine dipstick SMALL (A) NEGATIVE   Bilirubin Urine NEGATIVE NEGATIVE   Ketones, ur 5 (A) NEGATIVE mg/dL   Protein, ur NEGATIVE NEGATIVE mg/dL   Nitrite NEGATIVE NEGATIVE   Leukocytes,Ua MODERATE (A) NEGATIVE   RBC / HPF 0-5 0 - 5 RBC/hpf   WBC, UA >50 0 - 5 WBC/hpf    Comment:        Reflex urine culture not performed if WBC <=10, OR if Squamous epithelial cells >5. If Squamous epithelial cells >5 suggest recollection.    Bacteria, UA RARE (A) NONE SEEN   Squamous Epithelial / HPF 0-5 0 - 5 /HPF   Mucus PRESENT     Comment: Performed at [facility], [address] [place], [place] [address]  Urine Culture     Status: Abnormal (Preliminary result)   Collection Time: 08/08/23   8:34 PM   Specimen: Urine, Random  Result Value Ref Range   Specimen Description URINE, RANDOM    Special Requests NONE Reflexed from [id number]    Culture (A)     >=100,000 COLONIES/mL GRAM NEGATIVE RODS IDENTIFICATION AND SUSCEPTIBILITIES TO FOLLOW Performed at [facility], [address], [place], [place] [address]    Report Status PENDING   I-Stat Lactic Acid     Status: None   Collection Time: 08/08/23  9:35 PM  Result Value Ref Range   Lactic Acid, Venous 1.2 0.5 - 1.9 mmol/L  CBC     Status: Abnormal   Collection Time: 08/09/23  5:50 AM  Result Value Ref Range   WBC 10.7 (H) 4.0 - 10.5 K/uL   RBC 4.18 3.87 - 5.11 MIL/uL   Hemoglobin 12.2 12.0 - 15.0 g/dL   HCT 41.3 24.4 - 01.0 %   MCV 91.1 80.0 - 100.0 fL   MCH 29.2 26.0 - 34.0 pg   MCHC 32.0 30.0 - 36.0 g/dL   RDW 27.2 53.6 - 64.4 %   Platelets 312 150 - 400 K/uL   nRBC 0.0 0.0 - 0.2 %    Comment: Performed at [facility], [address] [place], [place] [address]  Basic metabolic panel     Status: Abnormal   Collection Time: 08/09/23  5:50 AM  Result Value Ref Range   Sodium 135 135 - 145 mmol/L   Potassium 4.0 3.5 - 5.1 mmol/L   Chloride  102 98 - 111 mmol/L   CO2 22 22 - 32 mmol/L   Glucose, Bld 87 70 - 99 mg/dL    Comment: Glucose reference range applies only to samples taken after fasting for at least 8 hours.   BUN 19 8 - 23 mg/dL   Creatinine, Ser 4.09 0.44 - 1.00 mg/dL   Calcium 8.8 (L) 8.9 - 10.3 mg/dL   GFR, Estimated 58 (L) >60 mL/min    Comment: (NOTE) Calculated using the CKD-EPI Creatinine Equation (2021)    Anion gap 11 5 - 15    Comment: Performed at [facility], [address], [place], [place] [address]  Magnesium      Status: None   Collection Time: 08/09/23  5:50 AM  Result Value Ref Range   Magnesium  1.8 1.7 - 2.4 mg/dL    Comment: Performed at [facility], [address] [place], [place] [address]  Phosphorus     Status: None   Collection Time: 08/09/23  5:50  AM  Result Value Ref Range   Phosphorus 3.0 2.5 - 4.6 mg/dL    Comment: Performed at [facility], [address] [place], [place] [address]    MICRO: Urine cx = gnr IMAGING: DG Chest 2 View Result Date: 08/08/2023 CLINICAL DATA:  Generalized weakness for 4 days.  Dizziness. EXAM: CHEST - 2 VIEW COMPARISON:  Chest radiograph 12/09/2022.  CT chest 04/07/2023 FINDINGS: Shallow inspiration. Mild cardiac enlargement. No vascular congestion. Peribronchial thickening and bronchiectasis consistent with chronic bronchitis. Mild increased density over the right lung base is likely due to soft tissue attenuation. No definite airspace disease or consolidation in the lungs. No pleural effusion or pneumothorax. Mediastinal contours appear intact. Degenerative changes in the spine and shoulders. IMPRESSION: Mild cardiac enlargement. Chronic bronchitic changes in the lungs. No focal consolidation. Electronically Signed   By: Boyce Byes M.D.   On: 08/08/2023 19:51     Assessment/Plan:  89yo F with recurrent right breast cellulitis Spongiotic dermatitis can be associated with eczema or allergic contact dermatitis. But wonder if she has some slort of implant rupture that maybe causing a cutaneous hypersensitivity reaction.  It would be useful to have her get breast mri to evaluate changes to her gel implants and if there are any abscesses that need to be drained.  Consider using tacrolimus cream to affected area. Avoid broken skin once daily for dermatitis.  For abtx recommendations we will stop vancomycin  and change to daptomycin for hte time being. She had allergy to cefadroxil  so we will not transition her to oral cephalosporins down the line  Does not have dysuria, suspect she has asymptomatic bactiuria. No need to treat (Though would get coverage with cefepime)  Follow universal precautions  I have personally spent 85 minutes involved in face-to-face and non-face-to-face activities for this  patient on the day of the visit. Professional time spent includes the following activities: Preparing to see the patient (review of tests), Obtaining and/or reviewing separately obtained history (admission/discharge record), Performing a medically appropriate examination and/or evaluation , Ordering medications/tests/procedures, referring and communicating with other health care professionals, Documenting clinical information in the EMR, Independently interpreting results (not separately reported), Communicating results to the patient/, Counseling and educating the patient.

## 2023-08-09 NOTE — Progress Notes (Signed)
 Mobility Specialist: Progress Note   08/09/23 1606  Mobility  Activity Stood at bedside  Level of Assistance Minimal assist, patient does 75% or more  Assistive Device Front wheel walker  Activity Response Tolerated well  Mobility Referral Yes  Mobility visit 1 Mobility  Mobility Specialist Start Time (ACUTE ONLY) 1400  Mobility Specialist Stop Time (ACUTE ONLY) 1413  Mobility Specialist Time Calculation (min) (ACUTE ONLY) 13 min    Pt requesting assistance to be readjusted in the chair. Performed 1x STS from the chair with MinA. Able to scoot back without assist. Left in chair with all needs met, call bell in reach.   Deloria Fetch Mobility Specialist Please contact via SecureChat or Rehab office at [phone]

## 2023-08-09 NOTE — ED Notes (Signed)
 ED TO INPATIENT HANDOFF REPORT  ED Nurse Name and Phone #: [phone]  S Name/Age/Gender Dawn Roy 89 y.o. female Room/Bed: 022C/022C  Code Status   Code Status: Limited: Do not attempt resuscitation (DNR) -DNR-LIMITED -Do Not Intubate/DNI   Home/SNF/Other Home Patient oriented to: self, place, time, and situation Is this baseline? Yes   Triage Complete: Triage complete  Chief Complaint Cellulitis [L03.90]  Triage Note Patient arrives via [facility] for generalized weakness x4 days. Endorses dizziness currently, daughter states she is at baseline. Per EMS, dry oral mucosa, pale. Alert and oriented x4.   BP 178/90 HR 96 O2 98 on room air CBG 94 Temp 97.4  Patient is on eliquis .    Allergies Allergies  Allergen Reactions   Azithromycin Hives   Dalmane [Flurazepam] Other (See Comments)    unknown   Demerol [Meperidine] Other (See Comments)    Unknown    Felodipine Other (See Comments)    unknown   Fosamax [Alendronate Sodium] Other (See Comments)    unknown   Hctz [Hydrochlorothiazide] Other (See Comments)    Unknown    Ivp Dye [Iodinated Contrast Media] Other (See Comments)    unknown   Keflin [Cephalothin] Other (See Comments)    unknown   Macrodantin [Nitrofurantoin Macrocrystal] Other (See Comments)    unknown   Morphine And Codeine Other (See Comments)    unknown   Phenergan [Promethazine Hcl] Other (See Comments)    unknown   Valium [Diazepam] Other (See Comments)    unknown   Doxycycline  Rash    headache   Keflex [Cephalexin] Rash   Neosporin [Neomycin-Bacitracin Zn-Polymyx] Rash   Penicillins Rash    Level of Care/Admitting Diagnosis ED Disposition     ED Disposition  Admit   Condition  --   Comment  Hospital Area: [facility] [[id number]]  Level of Care: Telemetry Medical [104]  May admit patient to [facility] or [facility] if equivalent level of care is available:: Yes  Covid Evaluation: Asymptomatic - no  recent exposure (last 10 days) testing not required  Diagnosis: Cellulitis [536644]  Admitting Physician: Bary Boss [[id number]]  Attending Physician: Bary Boss [[id number]]  Certification:: I certify this patient will need inpatient services for at least 2 midnights  Expected Medical Readiness: 08/10/2023          B Medical/Surgery History Past Medical History:  Diagnosis Date   Arthritis    Basal cell carcinoma    Breast cancer (HCC)    Depression    Hypertension    Osteoporosis    Vertigo    Past Surgical History:  Procedure Laterality Date   ABDOMINAL HYSTERECTOMY     BREAST RECONSTRUCTION     capsulotomy with vaginal laser     cataract surgery     CHOLECYSTECTOMY     COLONOSCOPY     DILATION AND CURETTAGE OF UTERUS     MASTECTOMY     TONSILLECTOMY       A IV Location/Drains/Wounds Patient Lines/Drains/Airways Status     Active Line/Drains/Airways     Name Placement date Placement time Site Days   Peripheral IV 08/08/23 20 G Right Forearm 08/08/23  1919  Forearm  1   Wound / Incision (Open or Dehisced) 04/12/23 Other (Comment) Breast Right;Left bilat breast cancerous leasions 04/12/23  --  Breast  119            Intake/Output Last 24 hours No intake or output data in the 24 hours  ending 08/09/23 0254  Labs/Imaging Results for orders placed or performed during the hospital encounter of 08/08/23 (from the past 48 hours)  Comprehensive metabolic panel     Status: Abnormal   Collection Time: 08/08/23  7:25 PM  Result Value Ref Range   Sodium 137 135 - 145 mmol/L   Potassium 4.0 3.5 - 5.1 mmol/L   Chloride 98 98 - 111 mmol/L   CO2 23 22 - 32 mmol/L   Glucose, Bld 106 (H) 70 - 99 mg/dL    Comment: Glucose reference range applies only to samples taken after fasting for at least 8 hours.   BUN 17 8 - 23 mg/dL   Creatinine, Ser 4.09 (H) 0.44 - 1.00 mg/dL   Calcium 9.1 8.9 - 81.1 mg/dL   Total Protein 6.9 6.5 - 8.1 g/dL   Albumin 2.9 (L) 3.5 - 5.0  g/dL   AST 21 15 - 41 U/L   ALT 9 0 - 44 U/L   Alkaline Phosphatase 57 38 - 126 U/L   Total Bilirubin 1.4 (H) 0.0 - 1.2 mg/dL   GFR, Estimated 49 (L) >60 mL/min    Comment: (NOTE) Calculated using the CKD-EPI Creatinine Equation (2021)    Anion gap 16 (H) 5 - 15    Comment: Performed at [facility], [address], [place], [place] [address]  CBC with Differential     Status: Abnormal   Collection Time: 08/08/23  7:25 PM  Result Value Ref Range   WBC 10.4 4.0 - 10.5 K/uL   RBC 4.36 3.87 - 5.11 MIL/uL   Hemoglobin 12.9 12.0 - 15.0 g/dL   HCT 29.5 62.1 - 30.8 %   MCV 91.5 80.0 - 100.0 fL   MCH 29.6 26.0 - 34.0 pg   MCHC 32.3 30.0 - 36.0 g/dL   RDW 65.7 84.6 - 96.2 %   Platelets 348 150 - 400 K/uL   nRBC 0.0 0.0 - 0.2 %   Neutrophils Relative % 80 %   Neutro Abs 8.4 (H) 1.7 - 7.7 K/uL   Lymphocytes Relative 10 %   Lymphs Abs 1.1 0.7 - 4.0 K/uL   Monocytes Relative 8 %   Monocytes Absolute 0.8 0.1 - 1.0 K/uL   Eosinophils Relative 1 %   Eosinophils Absolute 0.1 0.0 - 0.5 K/uL   Basophils Relative 1 %   Basophils Absolute 0.1 0.0 - 0.1 K/uL   Immature Granulocytes 0 %   Abs Immature Granulocytes 0.03 0.00 - 0.07 K/uL    Comment: Performed at [facility], [address], [place], [place] [address]  Protime-INR     Status: None   Collection Time: 08/08/23  7:25 PM  Result Value Ref Range   Prothrombin Time 14.7 11.4 - 15.2 seconds   INR 1.1 0.8 - 1.2    Comment: (NOTE) INR goal varies based on device and disease states. Performed at [facility], [address], [place], [place] [address]   Brain natriuretic peptide     Status: Abnormal   Collection Time: 08/08/23  7:25 PM  Result Value Ref Range   B Natriuretic Peptide 205.6 (H) 0.0 - 100.0 pg/mL    Comment: Performed at [facility], [address], [place], [place] [address]  I-Stat Lactic Acid     Status: Abnormal   Collection Time: 08/08/23  7:41 PM  Result Value Ref Range   Lactic Acid,  Venous 2.1 (HH) 0.5 - 1.9 mmol/L   Comment  NOTIFIED PHYSICIAN   Urinalysis, w/ Reflex to Culture (Infection Suspected) -Urine, Clean Catch     Status: Abnormal   Collection Time: 08/08/23  8:34 PM  Result Value Ref Range   Specimen Source URINE, CLEAN CATCH    Color, Urine YELLOW YELLOW   APPearance CLOUDY (A) CLEAR   Specific Gravity, Urine 1.017 1.005 - 1.030   pH 5.0 5.0 - 8.0   Glucose, UA NEGATIVE NEGATIVE mg/dL   Hgb urine dipstick SMALL (A) NEGATIVE   Bilirubin Urine NEGATIVE NEGATIVE   Ketones, ur 5 (A) NEGATIVE mg/dL   Protein, ur NEGATIVE NEGATIVE mg/dL   Nitrite NEGATIVE NEGATIVE   Leukocytes,Ua MODERATE (A) NEGATIVE   RBC / HPF 0-5 0 - 5 RBC/hpf   WBC, UA >50 0 - 5 WBC/hpf    Comment:        Reflex urine culture not performed if WBC <=10, OR if Squamous epithelial cells >5. If Squamous epithelial cells >5 suggest recollection.    Bacteria, UA RARE (A) NONE SEEN   Squamous Epithelial / HPF 0-5 0 - 5 /HPF   Mucus PRESENT     Comment: Performed at [facility], [address] [place], [place] [address]  I-Stat Lactic Acid     Status: None   Collection Time: 08/08/23  9:35 PM  Result Value Ref Range   Lactic Acid, Venous 1.2 0.5 - 1.9 mmol/L   DG Chest 2 View Result Date: 08/08/2023 CLINICAL DATA:  Generalized weakness for 4 days.  Dizziness. EXAM: CHEST - 2 VIEW COMPARISON:  Chest radiograph 12/09/2022.  CT chest 04/07/2023 FINDINGS: Shallow inspiration. Mild cardiac enlargement. No vascular congestion. Peribronchial thickening and bronchiectasis consistent with chronic bronchitis. Mild increased density over the right lung base is likely due to soft tissue attenuation. No definite airspace disease or consolidation in the lungs. No pleural effusion or pneumothorax. Mediastinal contours appear intact. Degenerative changes in the spine and shoulders. IMPRESSION: Mild cardiac enlargement. Chronic bronchitic changes in the lungs. No focal consolidation. Electronically  Signed   By: Boyce Byes M.D.   On: 08/08/2023 19:51    Pending Labs Unresulted Labs (From admission, onward)     Start     Ordered   08/09/23 0500  CBC  Tomorrow morning,   R        08/08/23 2045   08/09/23 0500  Basic metabolic panel  Tomorrow morning,   R        08/08/23 2045   08/09/23 0500  Magnesium   Tomorrow morning,   R        08/08/23 2045   08/09/23 0500  Phosphorus  Tomorrow morning,   R        08/08/23 2045   08/08/23 2034  Urine Culture  Once,   R        08/08/23 2034            Vitals/Pain Today's Vitals   08/09/23 0100 08/09/23 0130 08/09/23 0200 08/09/23 0230  BP: (!) 125/99 134/78 131/86 (!) 145/93  Pulse: (!) 106 96    Resp: 20 (!) 23 (!) 23 17  Temp:      TempSrc:      SpO2: 98% 96%    Weight:      Height:      PainSc:        Isolation Precautions No active isolations  Medications Medications  acetaminophen  (TYLENOL ) tablet 650 mg (has no administration in time range)  melatonin tablet 5 mg (has no  administration in time range)  ondansetron (ZOFRAN) injection 4 mg (has no administration in time range)  polyethylene glycol (MIRALAX  / GLYCOLAX ) packet 17 g (has no administration in time range)  0.9 %  sodium chloride  infusion ( Intravenous Rate/Dose Change 08/08/23 2211)  ceFEPIme (MAXIPIME) 2 g in sodium chloride  0.9 % 100 mL IVPB (0 g Intravenous Stopped 08/08/23 2241)  apixaban  (ELIQUIS ) tablet 5 mg (5 mg Oral Given 08/08/23 2123)  citalopram  (CELEXA ) tablet 20 mg (20 mg Oral Given 08/08/23 2123)  metoprolol  succinate (TOPROL -XL) 24 hr tablet 12.5 mg (12.5 mg Oral Given 08/08/23 2123)  vancomycin  (VANCOREADY) IVPB 500 mg/100 mL (has no administration in time range)  oxyCODONE (Oxy IR/ROXICODONE) immediate release tablet 5 mg (has no administration in time range)  metoprolol  tartrate (LOPRESSOR ) injection 2.5 mg (has no administration in time range)  acidophilus (RISAQUAD) capsule 1 capsule (has no administration in time range)  sodium chloride   0.9 % bolus 500 mL (0 mLs Intravenous Stopped 08/08/23 2001)  vancomycin  (VANCOCIN ) IVPB 1000 mg/200 mL premix (0 mg Intravenous Stopped 08/08/23 2209)    Mobility walks with device     Focused Assessments N/a   R Recommendations: See Admitting Provider Note  Report given to:   Additional Notes: uses bedpan / 20g RFA / daughter at bedside / lives with daughter at home / a&o / has mri scheduled / in afib rate ranging 90s-120s, missed a few doses of metoprolol  since Thursday, has had a dose here

## 2023-08-09 NOTE — Evaluation (Signed)
 Physical Therapy Evaluation Patient Details Name: Dawn Roy MRN: [id number] DOB: [birth year] Today's Date: 08/09/2023  History of Present Illness  89 y.o. female adm 08/08/23 with weakness, breast cellulitis and tachycardia. PMhx: breast CA s/p bil mastectomy with reconstruction, depression, HTN, vertigo, Afib, anxiety  Clinical Impression  Pt pleasant with daughter present initially to confirm home setup and PLOF. Pt with limited gait at baseline with RW and has assist for ADLs and IADLs from family. Pt unaware of situation or time but following commands with increased time. Pt with decreased transfers and gait who will benefit from acute therapy to maximize mobility, safety and function to decrease burden of care.        If plan is discharge home, recommend the following: A little help with bathing/dressing/bathroom;Assistance with cooking/housework;Assist for transportation;Direct supervision/assist for medications management;Direct supervision/assist for financial management;Supervision due to cognitive status   Can travel by private vehicle        Equipment Recommendations None recommended by PT  Recommendations for Other Services       Functional Status Assessment Patient has had a recent decline in their functional status and/or demonstrates limited ability to make significant improvements in function in a reasonable and predictable amount of time     Precautions / Restrictions Precautions Precautions: Fall;Other (comment) Recall of Precautions/Restrictions: Impaired Precaution/Restrictions Comments: incontinent      Mobility  Bed Mobility Overal bed mobility: Needs Assistance Bed Mobility: Supine to Sit     Supine to sit: Min assist, HOB elevated     General bed mobility comments: HOB 30 degrees, cues for sequence with assist to fully pivot to EOb and elevate trunk from surface    Transfers Overall transfer level: Needs assistance   Transfers: Sit to/from  Stand, Bed to chair/wheelchair/BSC Sit to Stand: Min assist           General transfer comment: min assist to rise from bed and recliner, cues for hand placement. Stand pivot bed to recliner with RW and min assist to direct RW    Ambulation/Gait Ambulation/Gait assistance: Min assist Gait Distance (Feet): 40 Feet Assistive device: Rolling walker (2 wheels) Gait Pattern/deviations: Step-through pattern, Decreased stride length, Trunk flexed   Gait velocity interpretation: <1.8 ft/sec, indicate of risk for recurrent falls   General Gait Details: cues for proximity to RW, direction and assist for managing IV pole  Stairs            Wheelchair Mobility     Tilt Bed    Modified Rankin (Stroke Patients Only)       Balance Overall balance assessment: Needs assistance Sitting-balance support: No upper extremity supported, Feet supported Sitting balance-Leahy Scale: Fair     Standing balance support: Bilateral upper extremity supported, During functional activity, Reliant on assistive device for balance Standing balance-Leahy Scale: Poor Standing balance comment: RW in standing                             Pertinent Vitals/Pain Pain Assessment Pain Assessment: No/denies pain    Home Living Family/patient expects to be discharged to:: Private residence Living Arrangements: Children Available Help at Discharge: Family;Available 24 hours/day Type of Home: House Home Access: Stairs to enter Entrance Stairs-Rails: Can reach both Entrance Stairs-Number of Steps: 3   Home Layout: One level Home Equipment: Grab bars - tub/shower;Grab bars - toilet;Rolling Walker (2 wheels);Wheelchair - power;Shower seat;Lift chair Additional Comments: Pt lives with daughter who is caregiver, available 24/7  Prior Function Prior Level of Function : Needs assist             Mobility Comments: Uses RW to mobilize, typically mod I for all household mobility max 50', WC  in community. assist for stairs and car transfers ADLs Comments: wears adult briefs, assist for bathing, (refuses getting in the shower), family does IaDLs     Extremity/Trunk Assessment   Upper Extremity Assessment Upper Extremity Assessment: Generalized weakness    Lower Extremity Assessment Lower Extremity Assessment: Generalized weakness    Cervical / Trunk Assessment Cervical / Trunk Assessment: Kyphotic  Communication   Communication Communication: Impaired Factors Affecting Communication: Hearing impaired    Cognition Arousal: Alert Behavior During Therapy: WFL for tasks assessed/performed   PT - Cognitive impairments: History of cognitive impairments                         Following commands: Impaired Following commands impaired: Follows one step commands with increased time     Cueing Cueing Techniques: Verbal cues, Gestural cues     General Comments      Exercises     Assessment/Plan    PT Assessment Patient needs continued PT services  PT Problem List Decreased activity tolerance;Decreased balance;Decreased mobility;Decreased safety awareness;Decreased knowledge of use of DME       PT Treatment Interventions DME instruction;Gait training;Functional mobility training;Stair training;Therapeutic activities;Therapeutic exercise    PT Goals (Current goals can be found in the Care Plan section)  Acute Rehab PT Goals Patient Stated Goal: return home PT Goal Formulation: With patient/family Time For Goal Achievement: 08/23/23 Potential to Achieve Goals: Fair    Frequency Min 1X/week     Co-evaluation               AM-PAC PT "6 Clicks" Mobility  Outcome Measure Help needed turning from your back to your side while in a flat bed without using bedrails?: A Little Help needed moving from lying on your back to sitting on the side of a flat bed without using bedrails?: A Little Help needed moving to and from a bed to a chair (including a  wheelchair)?: A Little Help needed standing up from a chair using your arms (e.g., wheelchair or bedside chair)?: A Little Help needed to walk in hospital room?: A Little Help needed climbing 3-5 steps with a railing? : A Little 6 Click Score: 18    End of Session Equipment Utilized During Treatment: Gait belt Activity Tolerance: Patient tolerated treatment well Patient left: in chair;with call bell/phone within reach;with chair alarm set Nurse Communication: Mobility status PT Visit Diagnosis: Other abnormalities of gait and mobility (R26.89);Difficulty in walking, not elsewhere classified (R26.2)    Time: 0912-0932 PT Time Calculation (min) (ACUTE ONLY): 20 min   Charges:   PT Evaluation $PT Eval Low Complexity: 1 Low   PT General Charges $$ ACUTE PT VISIT: 1 Visit         Annis Baseman, PT Acute Rehabilitation Services Office: [phone]   Jackey Mary Blessed Cotham 08/09/2023, 10:43 AM

## 2023-08-09 NOTE — Hospital Course (Addendum)
 89 y.o. female with medical history significant for breast cancer status post bilateral mastectomy followed by gel implant in 1991, permanent atrial fibrillation on Eliquis , chronic anxiety/depression, hypertension, history of cellulitis involving bilateral breast silicone gel implants with admission from 04/07/2023 through 04/16/2023, history of picking at her breasts skin, who presents today with complaints of generalized weakness and right breast redness, swelling, warmth, tenderness.  Her symptoms are worsening for the past few days.  The patient admits to picking at her breasts even in her sleep despite wearing gloves.     Assessment and Plan:   Concern for sepsis - Tachycardia, tachypnea, elevated lactic acidosis with right breast cellulitis give concern for sepsis on presentation.  Given aggressive IV fluid hydration, blood cultures, empiric antibiotics.  Lactate resolved.  Monitor blood pressure closely.   Right breast cellulitis - History of recurrent cellulitis with previous hospitalizations.  Reportedly patient admits to picking/scratching at her breasts even in her sleep.  Attempts to wear gloves/mitts have not resolved issue.  Recent admission for same, previous involving bilateral breast with concern for implant infection.  Follows with plastic surgery in the outpatient setting.  MRI pending to evaluate for abscess or peri-implant infection.  If present, would need evaluation by plastic surgeon.  Infectious disease consulted and following closely.  Vancomycin  transition to daptomycin.  Continue cefepime.  Await results of MRI.   Chronic right knee pain - Per report from family, patient has had right knee pain for some time, but apparently recently is worsening, sharp in nature.  Requesting Ortho evaluation.  Will order imaging first.     Persistent atrial fibrillation - Resume metoprolol  XL and Eliquis .  Telemetry on board.   Chronic HFpEF - Does not not appear to be in acute  exacerbation.  Euvolemic.  No hypoxia.   Chronic anxiety/depression - Resume home Celexa .   Physical debilitation muscle weakness - PT/OT pending.

## 2023-08-09 NOTE — Progress Notes (Signed)
 Pharmacy Antibiotic Note  Dawn Roy is a 89 y.o. female admitted on 08/08/2023 with R-breast cellulitis, noted prior mastectomy due to history of breast cancer and implants in place.  Pharmacy has been consulted to transition Vancomycin  to Daptomycin.   Given advanced age - will opt to use an alternative that is safer for her kidneys. Baseline CK with AM labs, CrCl>30 ml/min - okay for daily dosing.   Plan: - D/c Vancomycin  - Start Daptomycin 500 mg IV every 24 hours - CK with AM labs - Will continue to follow renal function, culture results, LOT, and antibiotic de-escalation plans   Height: 5\' 2"  (157.5 cm) Weight: 80 kg (176 lb 5.9 oz) IBW/kg (Calculated) : 50.1  Temp (24hrs), Avg:97.9 F (36.6 C), Min:97.8 F (36.6 C), Max:98 F (36.7 C)  Recent Labs  Lab 08/08/23 1925 08/08/23 1941 08/08/23 2135 08/09/23 0550  WBC 10.4  --   --  10.7*  CREATININE 1.07*  --   --  0.92  LATICACIDVEN  --  2.1* 1.2  --     Estimated Creatinine Clearance: 38.3 mL/min (by C-G formula based on SCr of 0.92 mg/dL).    Allergies  Allergen Reactions   Azithromycin Hives   Dalmane [Flurazepam] Other (See Comments)    unknown   Demerol [Meperidine] Other (See Comments)    Unknown    Felodipine Other (See Comments)    unknown   Fosamax [Alendronate Sodium] Other (See Comments)    unknown   Hctz [Hydrochlorothiazide] Other (See Comments)    Unknown    Ivp Dye [Iodinated Contrast Media] Other (See Comments)    unknown   Keflin [Cephalothin] Other (See Comments)    unknown   Macrodantin [Nitrofurantoin Macrocrystal] Other (See Comments)    unknown   Morphine And Codeine Other (See Comments)    unknown   Phenergan [Promethazine Hcl] Other (See Comments)    unknown   Valium [Diazepam] Other (See Comments)    unknown   Doxycycline  Rash    headache   Keflex [Cephalexin] Rash   Neosporin [Neomycin-Bacitracin Zn-Polymyx] Rash   Penicillins Rash    Antimicrobials this  admission: Vancomycin  6/1 x 1 Cefepime 6/1 >>  Microbiology results: 6/1 UCx >>  Thank you for allowing pharmacy to be a part of this patient's care.  Garland Junk, PharmD, BCPS, BCIDP Infectious Diseases Clinical Pharmacist 08/09/2023 2:59 PM   **Pharmacist phone directory can now be found on [email or web address] ([email or web address]).  Listed under [facility].

## 2023-08-09 NOTE — Evaluation (Signed)
 Occupational Therapy Evaluation Patient Details Name: Dawn Roy MRN: [id number] DOB: Jul 12, 1929 Today's Date: 08/09/2023   History of Present Illness   89 y.o. female adm 08/08/23 with weakness, breast cellulitis and tachycardia. PMhx: breast CA s/p bil mastectomy with reconstruction, depression, HTN, vertigo, Afib, anxiety     Clinical Impressions PTA, pt lived with daughter who assisted with LB ADL and IADLs. Upon eval, pt needing min A for transfers, CGA for SPT with RW. Eval limited by elevated HR up to 180s with RN present and administering medication at end of session. Recommending discharge home with HHOT at discharge.      If plan is discharge home, recommend the following:   A little help with walking and/or transfers;A lot of help with bathing/dressing/bathroom;Assistance with cooking/housework;Assist for transportation;Help with stairs or ramp for entrance;Direct supervision/assist for financial management;Direct supervision/assist for medications management     Functional Status Assessment   Patient has had a recent decline in their functional status and demonstrates the ability to make significant improvements in function in a reasonable and predictable amount of time.     Equipment Recommendations   None recommended by OT     Recommendations for Other Services         Precautions/Restrictions   Precautions Precautions: Fall;Other (comment) Recall of Precautions/Restrictions: Impaired Precaution/Restrictions Comments: incontinent Restrictions Weight Bearing Restrictions Per Provider Order: No     Mobility Bed Mobility               General bed mobility comments: OOB in chair    Transfers Overall transfer level: Needs assistance   Transfers: Sit to/from Stand, Bed to chair/wheelchair/BSC Sit to Stand: Min assist     Step pivot transfers: Contact guard assist     General transfer comment: min assist to rise from bed and recliner,  cues for hand placement.      Balance Overall balance assessment: Needs assistance Sitting-balance support: No upper extremity supported, Feet supported Sitting balance-Leahy Scale: Fair     Standing balance support: Bilateral upper extremity supported, During functional activity, Reliant on assistive device for balance Standing balance-Leahy Scale: Poor Standing balance comment: RW in standing                           ADL either performed or assessed with clinical judgement   ADL Overall ADL's : Needs assistance/impaired Eating/Feeding: Set up;Sitting   Grooming: Wash/dry face;Set up;Sitting   Upper Body Bathing: Set up;Sitting   Lower Body Bathing: Moderate assistance;Sit to/from stand   Upper Body Dressing : Set up;Sitting   Lower Body Dressing: Moderate assistance;Sit to/from stand   Toilet Transfer: Minimal assistance;Stand-pivot;Rolling walker (2 wheels);BSC/3in1   Toileting- Clothing Manipulation and Hygiene: Total assistance;Sit to/from stand       Functional mobility during ADLs: Rolling walker (2 wheels);Contact guard assist       Vision         Perception         Praxis         Pertinent Vitals/Pain Pain Assessment Pain Assessment: Faces Faces Pain Scale: Hurts a little bit Pain Location: bil knees Pain Descriptors / Indicators: Aching, Sore Pain Intervention(s): Monitored during session, Limited activity within patient's tolerance     Extremity/Trunk Assessment Upper Extremity Assessment Upper Extremity Assessment: Generalized weakness   Lower Extremity Assessment Lower Extremity Assessment: Generalized weakness   Cervical / Trunk Assessment Cervical / Trunk Assessment: Kyphotic   Communication Communication Communication: Impaired Factors Affecting  Communication: Hearing impaired   Cognition Arousal: Alert Behavior During Therapy: WFL for tasks assessed/performed Cognition: Cognition impaired   Orientation  impairments: Place, Time, Situation Awareness: Online awareness impaired Memory impairment (select all impairments): Short-term memory, Working memory Attention impairment (select first level of impairment): Focused attention, Sustained attention Executive functioning impairment (select all impairments): Organization, Problem solving OT - Cognition Comments: Pt daughter with no comments regarding changes in cognition and performs all IADL for pt so suspect near baseline cognition. Pt pleasantly confused throughout                 Following commands: Impaired Following commands impaired: Follows one step commands with increased time     Cueing  General Comments   Cueing Techniques: Verbal cues;Gestural cues  HR up to 180 with basic step pivot transfer thus further assessment of standing ADL limited   Exercises     Shoulder Instructions      Home Living Family/patient expects to be discharged to:: Private residence Living Arrangements: Children Available Help at Discharge: Family;Available 24 hours/day Type of Home: House Home Access: Stairs to enter [employer] of Steps: 3 Entrance Stairs-Rails: Can reach both Home Layout: One level     Bathroom Shower/Tub: Producer, television/film/video: Handicapped height     Home Equipment: Grab bars - tub/shower;Grab bars - toilet;Rolling Walker (2 wheels);Wheelchair - power;Shower seat;Lift chair (lift chair)   Additional Comments: Pt lives with daughter who is caregiver, available 24/7      Prior Functioning/Environment Prior Level of Function : Needs assist             Mobility Comments: Uses RW to mobilize, typically mod I for all household mobility max 50', WC in community. assist for stairs and car transfers ADLs Comments: wears adult briefs, assist for bathing, (refuses getting in the shower), family does IaDLs    OT Problem List: Decreased strength;Decreased activity tolerance;Impaired balance  (sitting and/or standing);Decreased safety awareness;Decreased knowledge of use of DME or AE   OT Treatment/Interventions: Self-care/ADL training;Therapeutic exercise;DME and/or AE instruction;Therapeutic activities;Patient/family education;Balance training      OT Goals(Current goals can be found in the care plan section)   Acute Rehab OT Goals OT Goal Formulation: Patient unable to participate in goal setting (2/2 cognition) Time For Goal Achievement: 08/23/23 Potential to Achieve Goals: Good   OT Frequency:  Min 1X/week    Co-evaluation              AM-PAC OT "6 Clicks" Daily Activity     Outcome Measure Help from another person eating meals?: A Little Help from another person taking care of personal grooming?: A Little Help from another person toileting, which includes using toliet, bedpan, or urinal?: A Lot Help from another person bathing (including washing, rinsing, drying)?: A Lot Help from another person to put on and taking off regular upper body clothing?: A Little Help from another person to put on and taking off regular lower body clothing?: A Lot 6 Click Score: 15   End of Session Equipment Utilized During Treatment: Rolling walker (2 wheels);Gait belt Nurse Communication: Mobility status;Other (comment) (elevated HR; RN provided medication)  Activity Tolerance: Patient tolerated treatment well Patient left: in chair;with call bell/phone within reach;with chair alarm set;with family/visitor present;with nursing/sitter in room  OT Visit Diagnosis: Unsteadiness on feet (R26.81);Muscle weakness (generalized) (M62.81)                Time: 5409-8119 OT Time Calculation (min): 31 min Charges:  OT General Charges $OT Visit: 1 Visit OT Evaluation $OT Eval Low Complexity: 1 Low OT Treatments $Self Care/Home Management : 8-22 mins  Karilyn Ouch, OTR/L [facility] Acute Rehabilitation Office: [phone]   Dawn Roy 08/09/2023, 4:46 PM

## 2023-08-10 ENCOUNTER — Inpatient Hospital Stay (HOSPITAL_COMMUNITY)

## 2023-08-10 DIAGNOSIS — M25561 Pain in right knee: Secondary | ICD-10-CM | POA: Diagnosis not present

## 2023-08-10 DIAGNOSIS — G8929 Other chronic pain: Secondary | ICD-10-CM

## 2023-08-10 DIAGNOSIS — Z9882 Breast implant status: Secondary | ICD-10-CM | POA: Diagnosis not present

## 2023-08-10 DIAGNOSIS — A419 Sepsis, unspecified organism: Secondary | ICD-10-CM | POA: Diagnosis present

## 2023-08-10 DIAGNOSIS — N61 Mastitis without abscess: Secondary | ICD-10-CM | POA: Diagnosis not present

## 2023-08-10 DIAGNOSIS — I4819 Other persistent atrial fibrillation: Secondary | ICD-10-CM | POA: Diagnosis not present

## 2023-08-10 DIAGNOSIS — D72829 Elevated white blood cell count, unspecified: Secondary | ICD-10-CM

## 2023-08-10 DIAGNOSIS — I5032 Chronic diastolic (congestive) heart failure: Secondary | ICD-10-CM | POA: Diagnosis present

## 2023-08-10 DIAGNOSIS — L309 Dermatitis, unspecified: Secondary | ICD-10-CM | POA: Diagnosis not present

## 2023-08-10 DIAGNOSIS — L308 Other specified dermatitis: Secondary | ICD-10-CM | POA: Diagnosis not present

## 2023-08-10 LAB — CBC
HCT: 32.3 % — ABNORMAL LOW (ref 36.0–46.0)
Hemoglobin: 10.5 g/dL — ABNORMAL LOW (ref 12.0–15.0)
MCH: 29.6 pg (ref 26.0–34.0)
MCHC: 32.5 g/dL (ref 30.0–36.0)
MCV: 91 fL (ref 80.0–100.0)
Platelets: 271 10*3/uL (ref 150–400)
RBC: 3.55 MIL/uL — ABNORMAL LOW (ref 3.87–5.11)
RDW: 13.8 % (ref 11.5–15.5)
WBC: 7.9 10*3/uL (ref 4.0–10.5)
nRBC: 0 % (ref 0.0–0.2)

## 2023-08-10 LAB — BASIC METABOLIC PANEL WITH GFR
Anion gap: 3 — ABNORMAL LOW (ref 5–15)
BUN: 20 mg/dL (ref 8–23)
CO2: 25 mmol/L (ref 22–32)
Calcium: 8.4 mg/dL — ABNORMAL LOW (ref 8.9–10.3)
Chloride: 108 mmol/L (ref 98–111)
Creatinine, Ser: 0.99 mg/dL (ref 0.44–1.00)
GFR, Estimated: 53 mL/min — ABNORMAL LOW (ref >60)
Glucose, Bld: 91 mg/dL (ref 70–99)
Potassium: 4.1 mmol/L (ref 3.5–5.1)
Sodium: 136 mmol/L (ref 135–145)

## 2023-08-10 LAB — URINE CULTURE: Culture: >=100000 — AB

## 2023-08-10 LAB — CK: Total CK: 134 U/L (ref 38–234)

## 2023-08-10 MED ORDER — HYDROCORTISONE 1 % EX OINT
TOPICAL_OINTMENT | Freq: Two times a day (BID) | CUTANEOUS | Status: DC
  Administered 2023-08-12: 1 via TOPICAL
  Filled 2023-08-10 (×2): qty 28

## 2023-08-10 MED ORDER — HYDROCORTISONE 0.5 % EX CREA: TOPICAL_CREAM | CUTANEOUS | Status: DC | PRN

## 2023-08-10 MED ORDER — GADOBUTROL 1 MMOL/ML IV SOLN
8.0000 mL | Freq: Once | INTRAVENOUS | Status: AC | PRN
  Administered 2023-08-10: 8 mL via INTRAVENOUS

## 2023-08-10 MED ORDER — SILVER SULFADIAZINE 1 % EX CREA
TOPICAL_CREAM | Freq: Every day | CUTANEOUS | Status: DC
  Filled 2023-08-10: qty 85

## 2023-08-10 MED ORDER — SODIUM CHLORIDE 0.9 % IV SOLN
2.0000 g | Freq: Every day | INTRAVENOUS | Status: DC
  Administered 2023-08-10 – 2023-08-12 (×3): 2 g via INTRAVENOUS
  Filled 2023-08-10 (×3): qty 20

## 2023-08-10 NOTE — Progress Notes (Signed)
 [facility]    Date of Admission:  08/08/2023   Total days of antibiotics 3   ID: Dawn Roy is a 89 y.o. female with  recurrent cellulitis/dermatitis Principal Problem:   Cellulitis Active Problems:   Persistent atrial fibrillation (HCC)   Sepsis (HCC)   Right knee pain   Chronic heart failure with preserved ejection fraction (HFpEF) (HCC)   Cellulitis of right breast    Subjective: Afebrile. Sleepy this morning. Daughter at bedside describing that the patient does pick at dry skin lateral breast at home, unintentionally  Medications:   acidophilus  1 capsule Oral Daily   apixaban   5 mg Oral BID   citalopram   20 mg Oral Daily   hydrocortisone    Topical BID   metoprolol  succinate  12.5 mg Oral Daily   silver sulfADIAZINE   Topical Daily    Objective: Vital signs in last 24 hours: Temp:  [97.8 F (36.6 C)-98.8 F (37.1 C)] 97.8 F (36.6 C) (06/03 0723) Pulse Rate:  [80-104] 80 (06/03 0723) Resp:  [16-20] 19 (06/03 0723) BP: (105-128)/(55-94) 110/55 (06/03 0723) SpO2:  [95 %-98 %] 98 % (06/03 0723) Weight:  [84.2 kg] 84.2 kg (06/03 0458)  Physical Exam  Constitutional:  sleeping. appears well-developed and well-nourished. No distress.  HENT: [place]/AT, PERRLA, no scleral icterus Mouth/Throat: Oropharynx is clear and moist. No oropharyngeal exudate.  Cardiovascular: Normal rate, regular rhythm and normal heart sounds. Exam reveals no gallop and no friction rub.  No murmur heard.  Pulmonary/Chest: Effort normal and breath sounds normal. No respiratory distress.  has no wheezes.  Skin: See photo    Lab Results Recent Labs    08/09/23 0550 08/10/23 0530  WBC 10.7* 7.9  HGB 12.2 10.5*  HCT 38.1 32.3*  NA 135 136  K 4.0 4.1  CL 102 108  CO2 22 25  BUN 19 20  CREATININE 0.92 0.99   Liver Panel Recent Labs    08/08/23 1925  PROT 6.9  ALBUMIN 2.9*  AST 21  ALT 9  ALKPHOS 57  BILITOT 1.4*   Sedimentation Rate No  results for input(s): "ESRSEDRATE" in the last 72 hours. C-Reactive Protein No results for input(s): "CRP" in the last 72 hours.  Microbiology: reviewed Studies/Results: DG Chest 2 View Result Date: 08/08/2023 CLINICAL DATA:  Generalized weakness for 4 days.  Dizziness. EXAM: CHEST - 2 VIEW COMPARISON:  Chest radiograph 12/09/2022.  CT chest 04/07/2023 FINDINGS: Shallow inspiration. Mild cardiac enlargement. No vascular congestion. Peribronchial thickening and bronchiectasis consistent with chronic bronchitis. Mild increased density over the right lung base is likely due to soft tissue attenuation. No definite airspace disease or consolidation in the lungs. No pleural effusion or pneumothorax. Mediastinal contours appear intact. Degenerative changes in the spine and shoulders. IMPRESSION: Mild cardiac enlargement. Chronic bronchitic changes in the lungs. No focal consolidation. Electronically Signed   By: Boyce Byes M.D.   On: 08/08/2023 19:51     Assessment/Plan: Breast cellulitis with dermatitis = recommend to continue with iv abtx. We will change cefepime to ceftriaxone. In addition to daptomcyin. Will add silversalidine cream for topical treatment  Will order breast binder to help protect area from itching  Leukocytosis = improving  Will change breast mri to chest mri since patient can't lay face down.  Dermatitis = can use tacrolimus ointment as outpatient once open wounds healing  Dawn Roy B. [facility] MD MPH [facility] [phone]       Dawn Roy  [facility] for Infectious Diseases Pager: [phone]  08/10/2023, 2:37 PM

## 2023-08-10 NOTE — Plan of Care (Addendum)
 Pt is alert and oriented x 4 but forgetful at times. Dry dsg applied to right breast following bath due to pt scratching and increasing size of wounds. Vitals stable. HR improved. Currently on yellow mews protocol but not flagging yellow at this time. Oxy given x 1 for right knee pain. Pt reports she has hx of this and has seen an orthopedic in past. Family requesting ortho consult. Will notify on coming shift.  Problem: Skin Integrity: Goal: Risk for impaired skin integrity will decrease Outcome: Not Progressing   Problem: Education: Goal: Knowledge of General Education information will improve Description: Including pain rating scale, medication(s)/side effects and non-pharmacologic comfort measures Outcome: Progressing   Problem: Health Behavior/Discharge Planning: Goal: Ability to manage health-related needs will improve Outcome: Progressing   Problem: Clinical Measurements: Goal: Ability to maintain clinical measurements within normal limits will improve Outcome: Progressing Goal: Will remain free from infection Outcome: Progressing Goal: Diagnostic test results will improve Outcome: Progressing Goal: Respiratory complications will improve Outcome: Progressing Goal: Cardiovascular complication will be avoided Outcome: Progressing   Problem: Activity: Goal: Risk for activity intolerance will decrease Outcome: Progressing   Problem: Nutrition: Goal: Adequate nutrition will be maintained Outcome: Progressing   Problem: Coping: Goal: Level of anxiety will decrease Outcome: Progressing   Problem: Elimination: Goal: Will not experience complications related to bowel motility Outcome: Progressing Goal: Will not experience complications related to urinary retention Outcome: Progressing   Problem: Pain Managment: Goal: General experience of comfort will improve and/or be controlled Outcome: Progressing   Problem: Safety: Goal: Ability to remain free from injury will  improve Outcome: Progressing

## 2023-08-10 NOTE — Progress Notes (Signed)
 Transition of Care [facility]) - Inpatient Brief Assessment   Patient Details  Name: Dawn Roy MRN: [id number] Date of Birth: [birth year]  Transition of [facility]) CM/SW Contact:    Dane Dung, RN Phone Number: 08/10/2023, 11:31 AM   Clinical Narrative: CM met with the patient and daughter at the bedside to discuss TOC needs for home.  The patient admitted for breast cellulitis and is currently being followed by ID MD for antibiotic needs.  Patient lives at home with daughter and plans to return home when stable.  DME at the home includes RW, WC (does not use), shower chair, gait belt. Rail on bed.  Medicare choice regarding home health was offered to the daughter.  Patient's daughter did not have a preference - patient was set up with [facility] recently but services were never started.  I called cheryl, RNCM with [facility] and services were accepted.  HH orders placed for PT/OT.  No other services at this time.  Patient plans to return home with home health when stable.   Transition of Care Asessment: Insurance and Status: (P) Insurance coverage has been reviewed Patient has primary care physician: (P) Yes Home environment has been reviewed: (P) from home with daughter Prior level of function:: (P) RW Prior/Current Home Services: (P) No current home services Social Drivers of Health Review: (P) SDOH reviewed interventions complete Readmission risk has been reviewed: (P) Yes Transition of care needs: (P) transition of care needs identified, TOC will continue to follow

## 2023-08-10 NOTE — Progress Notes (Signed)
 Mobility Specialist: Progress Note   08/10/23 1409  Mobility  Activity Transferred from bed to chair  Level of Assistance Contact guard assist, steadying assist  Assistive Device Front wheel walker  Activity Response Tolerated well  Mobility Referral Yes  Mobility visit 1 Mobility  Mobility Specialist Start Time (ACUTE ONLY) 0950  Mobility Specialist Stop Time (ACUTE ONLY) 1000  Mobility Specialist Time Calculation (min) (ACUTE ONLY) 10 min    Pt received in bed asleep but easily aroused, very pleasant and agreeable to mobility session. MinA for bed mobility to assist with scooting EOB. MinA to boost to stand. CG for short ambulation to the chair. No complaints. Left in chair with all needs met, call bell in reach. Chair alarm on.   Deloria Fetch Mobility Specialist Please contact via SecureChat or Rehab office at [phone]

## 2023-08-10 NOTE — Plan of Care (Signed)

## 2023-08-10 NOTE — Progress Notes (Addendum)
 Progress Note   Patient: Dawn Roy ZOX:[id number] DOB: [birth year] DOA: 08/08/2023  DOS: the patient was seen and examined on 08/10/2023   Brief hospital course:  89 y.o. female with medical history significant for breast cancer status post bilateral mastectomy followed by gel implant in 1991, permanent atrial fibrillation on Eliquis , chronic anxiety/depression, hypertension, history of cellulitis involving bilateral breast silicone gel implants with admission from 04/07/2023 through 04/16/2023, history of picking at her breasts skin, who presents today with complaints of generalized weakness and right breast redness, swelling, warmth, tenderness.  Her symptoms are worsening for the past few days.  The patient admits to picking at her breasts even in her sleep despite wearing gloves.     Assessment and Plan:   Concern for sepsis - Tachycardia, tachypnea, elevated lactic acidosis with right breast cellulitis give concern for sepsis on presentation.  Given aggressive IV fluid hydration, blood cultures, empiric antibiotics.  Lactate resolved.  Monitor blood pressure closely.   Right breast cellulitis - History of recurrent cellulitis with previous hospitalizations.  Reportedly patient admits to picking/scratching at her breasts even in her sleep.  Attempts to wear gloves/mitts have not resolved issue.  Recent admission for same, previous involving bilateral breast with concern for implant infection.  Follows with plastic surgery in the outpatient setting.  MRI pending to evaluate for abscess or peri-implant infection.  If present, would need evaluation by plastic surgeon.  Infectious disease consulted and following closely.  Vancomycin  transition to daptomycin.  Continue cefepime.  Await results of MRI.  Possible urinary tract infection - UA on presentation showing LE, bacteria, WBCs.  Preliminary UCx noting gram-negative rods.  Start empiric antibiotic coverage for above.  Patient with mild  confusion/dementia but does not report any urinary complaints at this time.  Chronic right knee pain - Per report from family, patient has had right knee pain for some time, but apparently recently is worsening, sharp in nature.  Requesting Ortho evaluation.  Will order imaging first.     Persistent atrial fibrillation - Resume metoprolol  XL and Eliquis .  Telemetry on board.   Chronic HFpEF - Does not not appear to be in acute exacerbation.  Euvolemic.  No hypoxia.   Chronic anxiety/depression - Resume home Celexa .   Physical debilitation muscle weakness - PT/OT pending.   Subjective: Patient resting comfortably this morning.  No acute distress.  Denies any fever, chills, chest pain.  Some itching in various places of her skin.  Physical Exam:  Vitals:   08/10/23 0100 08/10/23 0456 08/10/23 0458 08/10/23 0723  BP: 105/61 (!) 114/94  (!) 110/55  Pulse: 98   80  Resp: 16 16  19   Temp: 98.4 F (36.9 C) 98.7 F (37.1 C)  97.8 F (36.6 C)  TempSrc:      SpO2: 97% 95%  98%  Weight:   84.2 kg   Height:        GENERAL:  Alert, pleasant, no acute distress  HEENT:  EOMI CARDIOVASCULAR:  RRR, no murmurs appreciated RESPIRATORY:  Clear to auscultation, no wheezing, rales, or rhonchi GASTROINTESTINAL:  Soft, nontender, nondistended EXTREMITIES:  No LE edema bilaterally NEURO:  No new focal deficits appreciated SKIN:  No rashes noted PSYCH:  Appropriate mood and affect    Data Reviewed:  No new imaging to review  Previous records (including but not limited to H&P, progress notes, nursing notes, TOC management) were reviewed in assessment of this patient.  Labs: CBC: Recent Labs  Lab 08/08/23 1925 08/09/23 0550  08/10/23 0530  WBC 10.4 10.7* 7.9  NEUTROABS 8.4*  --   --   HGB 12.9 12.2 10.5*  HCT 39.9 38.1 32.3*  MCV 91.5 91.1 91.0  PLT 348 312 271   Basic Metabolic Panel: Recent Labs  Lab 08/08/23 1925 08/09/23 0550 08/10/23 0530  NA 137 135 136  K 4.0 4.0  4.1  CL 98 102 108  CO2 23 22 25   GLUCOSE 106* 87 91  BUN 17 19 20   CREATININE 1.07* 0.92 0.99  CALCIUM 9.1 8.8* 8.4*  MG  --  1.8  --   PHOS  --  3.0  --    Liver Function Tests: Recent Labs  Lab 08/08/23 1925  AST 21  ALT 9  ALKPHOS 57  BILITOT 1.4*  PROT 6.9  ALBUMIN 2.9*   CBG: No results for input(s): "GLUCAP" in the last 168 hours.  Scheduled Meds:  acidophilus  1 capsule Oral Daily   apixaban   5 mg Oral BID   citalopram   20 mg Oral Daily   metoprolol  succinate  12.5 mg Oral Daily   silver sulfADIAZINE   Topical Daily   Continuous Infusions:  cefTRIAXone (ROCEPHIN)  IV     DAPTOmycin 500 mg (08/09/23 1645)   PRN Meds:.acetaminophen , melatonin, metoprolol  tartrate, ondansetron (ZOFRAN) IV, oxyCODONE, polyethylene glycol  Family Communication: None at bedside  Disposition: Status is: Inpatient Remains inpatient appropriate because: Cellulitis requiring IV antibiotics     Time spent: 35 minutes  Length of inpatient stay: 2 days  Author: Jodeane Mulligan, DO 08/10/2023 1:08 PM  For on call review [email or web address].

## 2023-08-10 NOTE — Progress Notes (Signed)
 Occupational Therapy Treatment Patient Details Name: Dawn Roy MRN: [id number] DOB: [birth year] Today's Date: 08/10/2023   History of present illness 89 y.o. female adm 08/08/23 with weakness, breast cellulitis and tachycardia. PMhx: breast CA s/p bil mastectomy with reconstruction, depression, HTN, vertigo, Afib, anxiety   OT comments  Pt was in bed with nurse and NT completing a bed bath. Pt at the start of the session needed max cues to keep eyes open to be able to follow cues with rolling side to side in bed with moderate assist. She then completed supine to sit with min assist and sitting to standing with min assist and side step to chair with CGA and use of RW. Pt then started to perseverate on itching her back and needed distraction of music while attempting to keep pt in a standing position for about 3 mins. At this time as long as daughter can keep providing 24/7 support recommendation for [facility] services.       If plan is discharge home, recommend the following:  A little help with walking and/or transfers;A lot of help with bathing/dressing/bathroom;Assistance with cooking/housework;Assist for transportation;Help with stairs or ramp for entrance;Direct supervision/assist for financial management;Direct supervision/assist for medications management   Equipment Recommendations  None recommended by OT    Recommendations for Other Services      Precautions / Restrictions Precautions Precautions: Fall;Other (comment) Recall of Precautions/Restrictions: Impaired Precaution/Restrictions Comments: incontinent Restrictions Weight Bearing Restrictions Per Provider Order: No       Mobility Bed Mobility Overal bed mobility: Needs Assistance Bed Mobility: Supine to Sit     Supine to sit: Min assist, HOB elevated     General bed mobility comments: pt very fearful to let go of bed rails in session    Transfers Overall transfer level: Needs assistance Equipment used: Rolling  walker (2 wheels) Transfers: Sit to/from Stand Sit to Stand: Min assist, Contact guard assist           General transfer comment: Pt worked with therapist on sit to stand and on last stand needed only CGA as was fearful and wanting to pull on walker     Balance Overall balance assessment: Needs assistance Sitting-balance support: No upper extremity supported, Feet supported Sitting balance-Leahy Scale: Good Sitting balance - Comments: pt completed hair care while sitting at EOB   Standing balance support: Bilateral upper extremity supported, No upper extremity supported Standing balance-Leahy Scale: Fair Standing balance comment: when listening to music distracted pt and dancing in standing                           ADL either performed or assessed with clinical judgement   ADL Overall ADL's : Needs assistance/impaired Eating/Feeding: Set up;Sitting   Grooming: Wash/dry hands;Set up;Sitting   Upper Body Bathing: Set up;Sitting       Upper Body Dressing : Set up;Sitting       Toilet Transfer: Contact guard assist;Rolling walker (2 wheels) Toilet Transfer Details (indicate cue type and reason): step pivot         Functional mobility during ADLs: Contact guard assist;Rolling walker (2 wheels)      Extremity/Trunk Assessment Upper Extremity Assessment Upper Extremity Assessment: Generalized weakness   Lower Extremity Assessment Lower Extremity Assessment: Generalized weakness        Vision       Perception     Praxis     Communication Communication Communication: Impaired Factors Affecting Communication: Hearing impaired  Cognition Arousal: Alert Behavior During Therapy: WFL for tasks assessed/performed Cognition: Cognition impaired   Orientation impairments: Time, Situation Awareness: Online awareness impaired Memory impairment (select all impairments): Short-term memory, Working memory Attention impairment (select first level of  impairment): Focused attention, Sustained attention Executive functioning impairment (select all impairments): Organization, Problem solving OT - Cognition Comments: very happy but need max cues in the begining to keep her eyes open in session but once OOB was able to engage more with therapist                 Following commands: Impaired Following commands impaired: Follows one step commands with increased time      Cueing   Cueing Techniques: Verbal cues, Gestural cues  Exercises      Shoulder Instructions       General Comments      Pertinent Vitals/ Pain       Pain Assessment Pain Assessment: No/denies pain  Home Living                                          Prior Functioning/Environment              Frequency  Min 2X/week        Progress Toward Goals  OT Goals(current goals can now be found in the care plan section)  Progress towards OT goals: Progressing toward goals  Acute Rehab OT Goals OT Goal Formulation: With patient/family Time For Goal Achievement: 08/23/23 Potential to Achieve Goals: Good ADL Goals Pt Will Perform Grooming: with contact guard assist;standing Pt Will Perform Upper Body Dressing: with set-up;sitting Pt Will Transfer to Toilet: ambulating;bedside commode;with supervision  Plan      Co-evaluation                 AM-PAC OT "6 Clicks" Daily Activity     Outcome Measure   Help from another person eating meals?: A Little Help from another person taking care of personal grooming?: A Little Help from another person toileting, which includes using toliet, bedpan, or urinal?: A Lot Help from another person bathing (including washing, rinsing, drying)?: A Lot Help from another person to put on and taking off regular upper body clothing?: A Little Help from another person to put on and taking off regular lower body clothing?: A Lot 6 Click Score: 15    End of Session Equipment Utilized During  Treatment: Gait belt;Rolling walker (2 wheels)  OT Visit Diagnosis: Unsteadiness on feet (R26.81);Muscle weakness (generalized) (M62.81)   Activity Tolerance Patient tolerated treatment well   Patient Left in chair;with call bell/phone within reach;with chair alarm set;with family/visitor present (spoke to daughter if leaving to reposition pt and open door)   Nurse Communication Mobility status        Time: 1478-2956 OT Time Calculation (min): 45 min  Charges: OT General Charges $OT Visit: 1 Visit OT Treatments $Self Care/Home Management : 38-52 mins  Erving Heather OTR/L  Acute Rehab Services  [phone] office number   Stevphen Elders 08/10/2023, 12:25 PM

## 2023-08-11 DIAGNOSIS — I5032 Chronic diastolic (congestive) heart failure: Secondary | ICD-10-CM | POA: Diagnosis not present

## 2023-08-11 DIAGNOSIS — L03818 Cellulitis of other sites: Secondary | ICD-10-CM | POA: Diagnosis not present

## 2023-08-11 DIAGNOSIS — M25561 Pain in right knee: Secondary | ICD-10-CM | POA: Diagnosis not present

## 2023-08-11 DIAGNOSIS — I4819 Other persistent atrial fibrillation: Secondary | ICD-10-CM | POA: Diagnosis not present

## 2023-08-11 LAB — BASIC METABOLIC PANEL WITH GFR
Anion gap: 4 — ABNORMAL LOW (ref 5–15)
BUN: 23 mg/dL (ref 8–23)
CO2: 27 mmol/L (ref 22–32)
Calcium: 8.8 mg/dL — ABNORMAL LOW (ref 8.9–10.3)
Chloride: 104 mmol/L (ref 98–111)
Creatinine, Ser: 1.01 mg/dL — ABNORMAL HIGH (ref 0.44–1.00)
GFR, Estimated: 52 mL/min — ABNORMAL LOW (ref >60)
Glucose, Bld: 90 mg/dL (ref 70–99)
Potassium: 4.5 mmol/L (ref 3.5–5.1)
Sodium: 135 mmol/L (ref 135–145)

## 2023-08-11 LAB — CBC
HCT: 32.9 % — ABNORMAL LOW (ref 36.0–46.0)
Hemoglobin: 10.4 g/dL — ABNORMAL LOW (ref 12.0–15.0)
MCH: 29.3 pg (ref 26.0–34.0)
MCHC: 31.6 g/dL (ref 30.0–36.0)
MCV: 92.7 fL (ref 80.0–100.0)
Platelets: 269 10*3/uL (ref 150–400)
RBC: 3.55 MIL/uL — ABNORMAL LOW (ref 3.87–5.11)
RDW: 13.7 % (ref 11.5–15.5)
WBC: 7.3 10*3/uL (ref 4.0–10.5)
nRBC: 0 % (ref 0.0–0.2)

## 2023-08-11 NOTE — Plan of Care (Signed)

## 2023-08-11 NOTE — Progress Notes (Signed)
 Progress Note   Patient: Dawn Roy FAO:[id number] DOB: 1930-01-05 DOA: 08/08/2023  DOS: the patient was seen and examined on 08/11/2023   Brief hospital course:  89 y.o. female with medical history significant for breast cancer status post bilateral mastectomy followed by gel implant in 1991, permanent atrial fibrillation on Eliquis , chronic anxiety/depression, hypertension, history of cellulitis involving bilateral breast silicone gel implants with admission from 04/07/2023 through 04/16/2023, history of picking at her breasts skin, who presents today with complaints of generalized weakness and right breast redness, swelling, warmth, tenderness.  Her symptoms are worsening for the past few days.  The patient admits to picking at her breasts even in her sleep despite wearing gloves.    6/4: Hemodynamically stable.  MRI chest with no concern of abscess or obvious cellulitis.  Concern of some implant leak.   Assessment and Plan:   Sepsis - Tachycardia, tachypnea, elevated lactic acidosis with right breast cellulitis give concern for sepsis on presentation.  Given aggressive IV fluid hydration, blood cultures, empiric antibiotics.  Lactate resolved.  Monitor blood pressure closely.   Right breast cellulitis - History of recurrent cellulitis with previous hospitalizations.  Reportedly patient admits to picking/scratching at her breasts even in her sleep.  Attempts to wear gloves/mitts have not resolved issue.  Recent admission for same, previous involving bilateral breast with concern for implant infection.  Follows with plastic surgery in the outpatient setting.  MRI pending to evaluate for abscess or peri-implant infection.  If present, would need evaluation by plastic surgeon.  Infectious disease consulted and following closely.  Vancomycin  transition to daptomycin.  Continue ceftriaxone, MRI chest with no concern of abscess or evident cellulitis but there was a concern of implant leak but this MRI  was not very sensitive for that reason.   urinary tract infection Urine cultures with E. coli, shows resistance only to ampicillin, Augmentin, Cipro  and Bactrim . - Continue with current medications  Chronic right knee pain - Per report from family, patient has had right knee pain for some time, but apparently recently is worsening, sharp in nature.  Requesting Ortho evaluation.   Imaging with severe osteoarthritis-need outpatient evaluation   Persistent atrial fibrillation - Resume metoprolol  XL and Eliquis .  Telemetry on board.   Chronic HFpEF - Does not not appear to be in acute exacerbation.  Euvolemic.  No hypoxia.   Chronic anxiety/depression - Resume home Celexa .   Physical debilitation muscle weakness - PT/OT recommending SNF  Subjective: Patient was resting comfortably when seen today.  No new concern.  Denies any pain.  Physical Exam:  Vitals:   08/11/23 0854 08/11/23 0912 08/11/23 1218 08/11/23 1557  BP: 127/61 113/60 108/73 139/63  Pulse: 79 96 80 84  Resp: 18  18 18   Temp: (!) 97.4 F (36.3 C)   (!) 97.4 F (36.3 C)  TempSrc:      SpO2: 99%  100% 100%  Weight:      Height:        General.  Frail elderly lady, in no acute distress. Pulmonary.  Lungs clear bilaterally, normal respiratory effort. Right breast with bandage and mild surrounding erythema. CV.  Regular rate and rhythm, no JVD, rub or murmur. Abdomen.  Soft, nontender, nondistended, BS positive. CNS.  Alert and oriented .  No focal neurologic deficit. Extremities.  No edema, no cyanosis, pulses intact and symmetrical.   Data Reviewed: Prior data reviewed  Labs: CBC: Recent Labs  Lab 08/08/23 1925 08/09/23 0550 08/10/23 0530 08/11/23 0602  WBC  10.4 10.7* 7.9 7.3  NEUTROABS 8.4*  --   --   --   HGB 12.9 12.2 10.5* 10.4*  HCT 39.9 38.1 32.3* 32.9*  MCV 91.5 91.1 91.0 92.7  PLT 348 312 271 269   Basic Metabolic Panel: Recent Labs  Lab 08/08/23 1925 08/09/23 0550 08/10/23 0530  08/11/23 0602  NA 137 135 136 135  K 4.0 4.0 4.1 4.5  CL 98 102 108 104  CO2 23 22 25 27   GLUCOSE 106* 87 91 90  BUN 17 19 20 23   CREATININE 1.07* 0.92 0.99 1.01*  CALCIUM 9.1 8.8* 8.4* 8.8*  MG  --  1.8  --   --   PHOS  --  3.0  --   --    Liver Function Tests: Recent Labs  Lab 08/08/23 1925  AST 21  ALT 9  ALKPHOS 57  BILITOT 1.4*  PROT 6.9  ALBUMIN 2.9*   CBG: No results for input(s): "GLUCAP" in the last 168 hours.  Scheduled Meds:  acidophilus  1 capsule Oral Daily   apixaban   5 mg Oral BID   citalopram   20 mg Oral Daily   hydrocortisone    Topical BID   metoprolol  succinate  12.5 mg Oral Daily   silver sulfADIAZINE   Topical Daily   Continuous Infusions:  cefTRIAXone (ROCEPHIN)  IV 2 g (08/11/23 0922)   DAPTOmycin 500 mg (08/11/23 1441)   PRN Meds:.acetaminophen , melatonin, metoprolol  tartrate, ondansetron (ZOFRAN) IV, oxyCODONE, polyethylene glycol  Family Communication: None at bedside  Disposition: Status is: Inpatient Remains inpatient appropriate because: Cellulitis requiring IV antibiotics   DVT prophylaxis.  Eliquis  Time spent: 45 minutes  Length of inpatient stay: 3 days  Author: Luna Salinas, MD 08/11/2023 4:47 PM  For on call review [email or web address].

## 2023-08-11 NOTE — Care Management Important Message (Signed)
 Important Message  Patient Details  Name: Dawn Roy MRN: [id number] Date of Birth: December 17, 1929   Important Message Given:  Yes - Medicare IM     Wynonia Hedges 08/11/2023, 3:57 PM

## 2023-08-11 NOTE — Progress Notes (Signed)
 Mobility Specialist: Progress Note   08/11/23 1228  Mobility  Activity Stood at bedside  Level of Assistance Moderate assist, patient does 50-74%  Assistive Device Front wheel walker  Activity Response Tolerated poorly  Mobility Referral Yes  Mobility visit 1 Mobility  Mobility Specialist Start Time (ACUTE ONLY) 1115  Mobility Specialist Stop Time (ACUTE ONLY) 1126  Mobility Specialist Time Calculation (min) (ACUTE ONLY) 11 min    Pt received in bed, agreeable to mobility session. C/o her back itching and pt was constantly requesting the MS to scratch her back throughout the session. ModA for bed mobility to assist with trunk elevation and scooting EOB. Five attempts to stand, modA for successful stand on the 5th attempt. Prior attempts unsuccessful d/t pt being fatigued and c/o feeling weak. Pt needed to sit immediately upon standing. Further mobility deferred. Pt stated she just feeling very fatigued today. Returned pt to supine. TotA to slide up and adjust in bed. Left in bed with all needs met, call bell in reach.   Deloria Fetch Mobility Specialist Please contact via SecureChat or Rehab office at [phone]

## 2023-08-12 DIAGNOSIS — Z9882 Breast implant status: Secondary | ICD-10-CM

## 2023-08-12 DIAGNOSIS — N611 Abscess of the breast and nipple: Secondary | ICD-10-CM | POA: Diagnosis not present

## 2023-08-12 DIAGNOSIS — I5032 Chronic diastolic (congestive) heart failure: Secondary | ICD-10-CM | POA: Diagnosis not present

## 2023-08-12 DIAGNOSIS — T8543XA Leakage of breast prosthesis and implant, initial encounter: Secondary | ICD-10-CM

## 2023-08-12 DIAGNOSIS — L03818 Cellulitis of other sites: Secondary | ICD-10-CM | POA: Diagnosis not present

## 2023-08-12 DIAGNOSIS — N61 Mastitis without abscess: Secondary | ICD-10-CM | POA: Diagnosis not present

## 2023-08-12 DIAGNOSIS — L309 Dermatitis, unspecified: Secondary | ICD-10-CM | POA: Diagnosis present

## 2023-08-12 DIAGNOSIS — M25561 Pain in right knee: Secondary | ICD-10-CM | POA: Diagnosis not present

## 2023-08-12 DIAGNOSIS — I4819 Other persistent atrial fibrillation: Secondary | ICD-10-CM | POA: Diagnosis not present

## 2023-08-12 MED ORDER — DICLOFENAC SODIUM 1 % EX GEL
4.0000 g | Freq: Four times a day (QID) | CUTANEOUS | Status: DC
  Administered 2023-08-12 – 2023-08-18 (×22): 4 g via TOPICAL
  Filled 2023-08-12: qty 100

## 2023-08-12 MED ORDER — LINEZOLID 600 MG PO TABS
600.0000 mg | ORAL_TABLET | Freq: Two times a day (BID) | ORAL | Status: AC
  Administered 2023-08-13 – 2023-08-14 (×4): 600 mg via ORAL
  Filled 2023-08-12 (×4): qty 1

## 2023-08-12 NOTE — Plan of Care (Signed)

## 2023-08-12 NOTE — Progress Notes (Signed)
 Progress Note   Patient: Dawn Roy ZOX:[id number] DOB: Apr 01, 1929 DOA: 08/08/2023  DOS: the patient was seen and examined on 08/12/2023   Brief hospital course:  89 y.o. female with medical history significant for breast cancer status post bilateral mastectomy followed by gel implant in 1991, permanent atrial fibrillation on Eliquis , chronic anxiety/depression, hypertension, history of cellulitis involving bilateral breast silicone gel implants with admission from 04/07/2023 through 04/16/2023, history of picking at her breasts skin, who presents today with complaints of generalized weakness and right breast redness, swelling, warmth, tenderness.  Her symptoms are worsening for the past few days.  The patient admits to picking at her breasts even in her sleep despite wearing gloves.    6/4: Hemodynamically stable.  MRI chest with no concern of abscess or obvious cellulitis.  Concern of some implant leak.  6/5: Hemodynamically stable.  Plastic surgery was consulted for concern of implant leak.  Dr. Carolynne Citron or anyone from his group will see her later today.   Assessment and Plan:   Sepsis - Tachycardia, tachypnea, elevated lactic acidosis with right breast cellulitis give concern for sepsis on presentation.  Given aggressive IV fluid hydration, blood cultures, empiric antibiotics.  Lactate resolved.  Monitor blood pressure closely.   Right breast cellulitis - History of recurrent cellulitis with previous hospitalizations.  Reportedly patient admits to picking/scratching at her breasts even in her sleep.  Attempts to wear gloves/mitts have not resolved issue.  Recent admission for same, previous involving bilateral breast with concern for implant infection.  Follows with plastic surgery in the outpatient setting.  MRI pending to evaluate for abscess or peri-implant infection.  If present, would need evaluation by plastic surgeon.  Infectious disease consulted and following closely.  Vancomycin   transition to daptomycin.  Continue ceftriaxone, MRI chest with no concern of abscess or evident cellulitis but there was a concern of implant leak but this MRI was not very sensitive for that reason. - Plastic surgery was consulted   urinary tract infection Urine cultures with E. coli, shows resistance only to ampicillin, Augmentin, Cipro  and Bactrim . - Continue with current medications  Chronic right knee pain - Per report from family, patient has had right knee pain for some time, but apparently recently is worsening, sharp in nature.  Requesting Ortho evaluation.   Imaging with severe osteoarthritis-need outpatient evaluation -Voltaren gel added   Persistent atrial fibrillation - Resume metoprolol  XL and Eliquis .  Telemetry on board.   Chronic HFpEF - Does not not appear to be in acute exacerbation.  Euvolemic.  No hypoxia.   Chronic anxiety/depression - Resume home Celexa .   Physical debilitation muscle weakness - PT/OT recommending SNF  Subjective: Patient was sitting in chair comfortably when seen today.  Complaining of right knee pain.  Physical Exam:  Vitals:   08/12/23 0403 08/12/23 0456 08/12/23 0856 08/12/23 1214  BP: 127/74  (!) 129/59 132/65  Pulse: 89  90 87  Resp: 16  17 17   Temp: 97.7 F (36.5 C)  98.4 F (36.9 C) (!) 97.5 F (36.4 C)  TempSrc: Oral  Oral   SpO2: 100%  98% 100%  Weight:  83.2 kg    Height:       General.  Frail elderly lady, in no acute distress.  Breast erythema seems improving. Pulmonary.  Lungs clear bilaterally, normal respiratory effort. CV.  Regular rate and rhythm, no JVD, rub or murmur. Abdomen.  Soft, nontender, nondistended, BS positive. CNS.  Alert and oriented .  No focal neurologic  deficit. Extremities.  No edema, no cyanosis, pulses intact and symmetrical.   Data Reviewed: Prior data reviewed  Labs: CBC: Recent Labs  Lab 08/08/23 1925 08/09/23 0550 08/10/23 0530 08/11/23 0602  WBC 10.4 10.7* 7.9 7.3   NEUTROABS 8.4*  --   --   --   HGB 12.9 12.2 10.5* 10.4*  HCT 39.9 38.1 32.3* 32.9*  MCV 91.5 91.1 91.0 92.7  PLT 348 312 271 269   Basic Metabolic Panel: Recent Labs  Lab 08/08/23 1925 08/09/23 0550 08/10/23 0530 08/11/23 0602  NA 137 135 136 135  K 4.0 4.0 4.1 4.5  CL 98 102 108 104  CO2 23 22 25 27   GLUCOSE 106* 87 91 90  BUN 17 19 20 23   CREATININE 1.07* 0.92 0.99 1.01*  CALCIUM 9.1 8.8* 8.4* 8.8*  MG  --  1.8  --   --   PHOS  --  3.0  --   --    Liver Function Tests: Recent Labs  Lab 08/08/23 1925  AST 21  ALT 9  ALKPHOS 57  BILITOT 1.4*  PROT 6.9  ALBUMIN 2.9*   CBG: No results for input(s): "GLUCAP" in the last 168 hours.  Scheduled Meds:  acidophilus  1 capsule Oral Daily   apixaban   5 mg Oral BID   citalopram   20 mg Oral Daily   diclofenac Sodium  4 g Topical QID   hydrocortisone    Topical BID   metoprolol  succinate  12.5 mg Oral Daily   silver sulfADIAZINE   Topical Daily   Continuous Infusions:  cefTRIAXone (ROCEPHIN)  IV 2 g (08/12/23 0920)   DAPTOmycin 500 mg (08/11/23 1441)   PRN Meds:.acetaminophen , melatonin, metoprolol  tartrate, ondansetron (ZOFRAN) IV, oxyCODONE, polyethylene glycol  Family Communication: None at bedside  Disposition: Status is: Inpatient Remains inpatient appropriate because: Cellulitis requiring IV antibiotics   DVT prophylaxis.  Eliquis  Time spent: 44 minutes  Length of inpatient stay: 4 days  Author: Luna Salinas, MD 08/12/2023 12:40 PM  For on call review [email or web address].

## 2023-08-12 NOTE — Progress Notes (Signed)
 [facility]    Date of Admission:  08/08/2023   Total days of antibiotics 5   ID: Dawn Roy is a 89 y.o. female with  breast implants with associated Principal Problem:   Cellulitis Active Problems:   Persistent atrial fibrillation (HCC)   Sepsis (HCC)   Right knee pain   Chronic heart failure with preserved ejection fraction (HFpEF) (HCC)   Cellulitis of right breast    Subjective: She feels that the breast binder is helping as well as cream. Daughter reports the right breast looks improved.  Mri showing compromised gel implants  Medications:   acidophilus  1 capsule Oral Daily   apixaban   5 mg Oral BID   citalopram   20 mg Oral Daily   diclofenac Sodium  4 g Topical QID   hydrocortisone    Topical BID   [START ON 08/13/2023] linezolid  600 mg Oral Q12H   metoprolol  succinate  12.5 mg Oral Daily   silver sulfADIAZINE   Topical Daily    Objective: Vital signs in last 24 hours: Temp:  [97.4 F (36.3 C)-98.4 F (36.9 C)] 97.5 F (36.4 C) (06/05 1214) Pulse Rate:  [84-98] 87 (06/05 1214) Resp:  [16-19] 17 (06/05 1214) BP: (92-139)/(51-74) 132/65 (06/05 1214) SpO2:  [97 %-100 %] 100 % (06/05 1214) Weight:  [83.2 kg] 83.2 kg (06/05 0456)  Physical Exam  Constitutional:  oriented to person, place, and time. appears well-developed and well-nourished. No distress.  HENT: [place]/AT, PERRLA, no scleral icterus Mouth/Throat: Oropharynx is clear and moist. No oropharyngeal exudate.  Cardiovascular: Normal rate, regular rhythm and normal heart sounds. Exam reveals no gallop and no friction rub.  No murmur heard.  Pulmonary/Chest: Effort normal and breath sounds normal. No respiratory distress.  has no wheezes.  Neck = supple, no nuchal rigidity Abdominal: Soft. Bowel sounds are normal.  exhibits no distension. There is no tenderness.  Lymphadenopathy: no cervical adenopathy. No axillary adenopathy Neurological: alert and oriented to person,  place, and time.  Skin: Skin is warm and dry. No rash noted. No erythema.  Psychiatric: a normal mood and affect.  behavior is normal.     Media Information  From bottom to upward view of bottom half of right breast  Lab Results Recent Labs    08/10/23 0530 08/11/23 0602  WBC 7.9 7.3  HGB 10.5* 10.4*  HCT 32.3* 32.9*  NA 136 135  K 4.1 4.5  CL 108 104  CO2 25 27  BUN 20 23  CREATININE 0.99 1.01*   Liver Panel No results for input(s): "PROT", "ALBUMIN", "AST", "ALT", "ALKPHOS", "BILITOT", "BILIDIR", "IBILI" in the last 72 hours. Sedimentation Rate No results for input(s): "ESRSEDRATE" in the last 72 hours. C-Reactive Protein No results for input(s): "CRP" in the last 72 hours.  Microbiology:  Studies/Results: MR CHEST W WO CONTRAST Result Date: 08/11/2023 CLINICAL DATA:  89 year old female with a history of bilateral mastectomies and implant reconstructions in 1991. Patient presents with clinical findings of cellulitis involving both breasts, previous hospital admission 04/07/2023 through 04/16/2023. EXAM: MR CHEST WITH AND WITHOUT CONTRAST TECHNIQUE: Multiplanar, multisequence MR imaging of the chest was performed before and after the administration of intravenous contrast. Standard chest/body coil which lies. CONTRAST:  8mL GADAVIST GADOBUTROL 1 MMOL/ML IV SOLN COMPARISON:  CT chest, 04/07/2023 and 12/10/2022. FINDINGS: Right breast: There is heterogeneous signal from the right implant. More centrally, implant has signal characteristics consistent with silicone. Around the periphery there is more heterogeneous signal with  areas of increased T1 and decreased T2 signal as well as increased T1 and T2 signal and some decreased T1 and bright T2 signal. This is contained within the fibrous capsule, consistent with complex intracapsular rupture. Some of the areas of heterogeneous signal suggest previous hemorrhage between the implant shell and fibrous capsule. Medial to the fibrous  capsule there is increased T2 signal without associated enhancement, consistent with edema. There are no breast masses. There are no areas of abnormal extracapsular breast enhancement to suggest malignancy. Left breast: Left breast implant also shows heterogeneous signal. This all appears intracapsular consistent with a complex intracapsular rupture. There is a combination of silicone signal and fluid. There is enhancement along the posterior aspect the capsule that is nonspecific, but grossly intracapsular and not suspicious for malignancy. There are no breast masses. There are no areas of abnormal enhancement to suggest breast malignancy. Mild T2 signal hyperintensity is noted along the lower lateral aspect the implant capsule consistent with edema. Remaining chest soft tissues: Additional areas of increased T2 signal are noted along the posteroinferior aspect of the chest wall consistent with edema. There is no associated enhancement to suggest cellulitis. Heterogeneous posterior left thyroid nodule, stable from the 04/07/2023 CT scan. There are no fluid collections to suggest an abscess. Musculoskeletal: No fracture or bone lesion.  No abnormal bone marrow signal. IMPRESSION: 1. No convincing chest wall cellulitis.  No evidence of an abscess. 2. Areas of subcutaneous soft tissue edema, most evident along the medial aspect the right breast implant and posterolateral aspects of the chest wall. 3. Bilateral abnormal breast implants grossly consistent with extensive intracapsular rupture. Current MRI protocol was not designed for assessment of silicone implant integrity, with no silicone specific sequences acquired. For this reason, subtle extracapsular rupture or silicone bleed is not excluded. There is enhancement along a portion of the posterior upper left breast implant, posterior to the implant shell, associated with an oval area of heterogeneous signal measuring 3 cm in size. This may reflect a focus of prior  hemorrhage but is nonspecific. Electronically Signed   By: Amanda Jungling M.D.   On: 08/11/2023 16:24     Assessment/Plan: Breast cellulitis with dermatitis = will change iv abtx to orals since afebrile and her PIV appears infiltrated. She only need 3 days left of linezolid to complete 7 day course of treatment  Dermatitis = possibly due to get gel implants. Please have plastic surgery evaluate patient to see whether she would benefit from explant . Weighing pros/cons given her age  Continue with hydrocortisone  cream daily-bid if needed  Will sign off.  I have personally spent 50 minutes involved in face-to-face and non-face-to-face activities for this patient on the day of the visit. Professional time spent includes the following activities: Preparing to see the patient (review of tests), Obtaining and/or reviewing separately obtained history (admission/discharge record), Performing a medically appropriate examination and/or evaluation ,  and communicating with other health care professionals, Documenting clinical information in the EMR, Independently interpreting results (not separately reported), Communicating results to the patient and her daughter  [facility] for Infectious Diseases Pager: [phone]  08/12/2023, 2:26 PM

## 2023-08-12 NOTE — Consult Note (Signed)
 [facility]  Reason for Consult: Ulceration over right reconstructed breast, ruptured implants Referring Physician: Luna Salinas, MD  Dawn Roy is an 89 y.o. female.  HPI: Patient is a 89 year old female with history of bilateral mastectomy and gel implant-based reconstruction about 40 years ago, no history of radiation. She has a history of cellulitis and infection of bilateral breasts with bilateral breast ulcerations.  She was admitted to the hospital on 08/08/2023 after presenting to the ED for increased redness.  She has a past medical history of atrial fibrillation, chronic heart failure with preserved ejection fraction  She was previously hospitalized January - February of this year, was discharged on 04/16/2023.  At that time she had biopsies of both breast, biopsy showed spongiotic dermatitis with hyperkeratosis.  Negative for malignancy.  Patient had MRI on 08/10/2023, with no convincing chest wall cellulitis, no abscess evidence.  Implants consistent with extensive intracapsular rupture, however subtle extracapsular rupture or silicone bleed cannot be excluded.  Patient was receiving IV antibiotics, has noted significant improvement in breast.  Changed to oral antibiotics today.  She is accompanied by her family at bedside today, they report that she had redness of her right breast with streaking that was worsening. Per family, she has been seen as an outpatient with dermatology, they have been applying Silvadene ointment to the breast wounds.  They report the left breast wound has healed, the right breast wounds are still present.  WBC stable and improved.   Past Medical History:  Diagnosis Date   Arthritis    Basal cell carcinoma    Breast cancer (HCC)    Depression    Hypertension    Osteoporosis    Vertigo     Past Surgical History:  Procedure Laterality Date   ABDOMINAL HYSTERECTOMY     BREAST RECONSTRUCTION     capsulotomy with vaginal  laser     cataract surgery     CHOLECYSTECTOMY     COLONOSCOPY     DILATION AND CURETTAGE OF UTERUS     MASTECTOMY     TONSILLECTOMY      No family history on file.  Social History:  reports that she has never smoked. She has never used smokeless tobacco. She reports that she does not drink alcohol and does not use drugs.  Allergies:  Allergies  Allergen Reactions   Azithromycin Hives   Dalmane [Flurazepam] Other (See Comments)    unknown   Demerol [Meperidine] Other (See Comments)    Unknown    Felodipine Other (See Comments)    unknown   Fosamax [Alendronate Sodium] Other (See Comments)    unknown   Hctz [Hydrochlorothiazide] Other (See Comments)    Unknown    Ivp Dye [Iodinated Contrast Media] Other (See Comments)    unknown   Keflin [Cephalothin] Other (See Comments)    unknown   Macrodantin [Nitrofurantoin Macrocrystal] Other (See Comments)    unknown   Morphine And Codeine Other (See Comments)    unknown   Phenergan [Promethazine Hcl] Other (See Comments)    unknown   Valium [Diazepam] Other (See Comments)    unknown   Doxycycline  Rash    headache   Keflex [Cephalexin] Rash   Neosporin [Neomycin-Bacitracin Zn-Polymyx] Rash   Penicillins Rash    Medications: I have reviewed the patient's current medications.  Results for orders placed or performed during the hospital encounter of 08/08/23 (from the past 48 hours)  CBC     Status: Abnormal   Collection  Time: 08/11/23  6:02 AM  Result Value Ref Range   WBC 7.3 4.0 - 10.5 K/uL   RBC 3.55 (L) 3.87 - 5.11 MIL/uL   Hemoglobin 10.4 (L) 12.0 - 15.0 g/dL   HCT 16.1 (L) 09.6 - 04.5 %   MCV 92.7 80.0 - 100.0 fL   MCH 29.3 26.0 - 34.0 pg   MCHC 31.6 30.0 - 36.0 g/dL   RDW 40.9 81.1 - 91.4 %   Platelets 269 150 - 400 K/uL   nRBC 0.0 0.0 - 0.2 %    Comment: Performed at [facility], [address] [place], [place] [address]  Basic metabolic panel with GFR     Status: Abnormal   Collection Time:  08/11/23  6:02 AM  Result Value Ref Range   Sodium 135 135 - 145 mmol/L   Potassium 4.5 3.5 - 5.1 mmol/L   Chloride 104 98 - 111 mmol/L   CO2 27 22 - 32 mmol/L   Glucose, Bld 90 70 - 99 mg/dL    Comment: Glucose reference range applies only to samples taken after fasting for at least 8 hours.   BUN 23 8 - 23 mg/dL   Creatinine, Ser 6.21 (H) 0.44 - 1.00 mg/dL   Calcium 8.8 (L) 8.9 - 10.3 mg/dL   GFR, Estimated 52 (L) >60 mL/min    Comment: (NOTE) Calculated using the CKD-EPI Creatinine Equation (2021)    Anion gap 4 (L) 5 - 15    Comment: Performed at [facility], [address] [place], [place] [address]    No results found.  Review of Systems  Unable to perform ROS: Other   Blood pressure 137/61, pulse 91, temperature 97.8 F (36.6 C), resp. rate 17, height 5\' 2"  (1.575 m), weight 83.2 kg, SpO2 99%. Physical Exam Constitutional:      General: She is not in acute distress.    Appearance: She is not ill-appearing, toxic-appearing or diaphoretic.  HENT:     Head: Normocephalic and atraumatic.  Pulmonary:     Effort: Pulmonary effort is normal.  Chest:     Comments: Left breast without any wounds.  No erythema, cellulitic changes or discomfort with palpation.  Implant is firm  Right breast with superficial wounds/ulcerations noted.  There is no surrounding cellulitic changes.  Erythema significantly improved.  Implant is firm, but no obvious capsular calcifications noted with palpation.  No foul odor is noted.  No tenderness noted palpation.  Skin:    General: Skin is warm and dry.  Neurological:     General: No focal deficit present.  Psychiatric:        Mood and Affect: Mood normal.       Assessment/Plan: Ulcerations of right breast, ruptured gel implants:  Patient is a very pleasant 89 year old female with history of implant-based breast reconstruction greater than 40 years ago.  She has had ongoing wounds of bilateral breasts, left breast wounds have  completely healed.  Right breast wounds appear to be healing well on exam.  She has had bouts of cellulitic changes, quickly improving with IV antibiotics.  MRI with evidence of intracapsular rupture, without obvious evidence of extracapsular rupture, however cannot be excluded due to imaging techniques per radiology report.  Had a discussion with family in regards to options for ongoing management including continued wound care and leaving the implants in place or explantation of implants.  I discussed with patient and family that I would consult with Dr. Carolynne Citron and we  would further discuss.  Due to her age and comorbidities she is at a significant risk of complications with anesthesia and surgical recovery.  We will plan to reevaluate patient tomorrow, further discuss plan with family and patient.  Pictures were taken and placed in the patient's chart with patient and family permission.   Janalyn Me Chad Tiznado, PA-C 08/12/2023, 6:39 PM

## 2023-08-12 NOTE — Progress Notes (Signed)
 Mobility Specialist: Progress Note   08/12/23 1423  Mobility  Activity Transferred from bed to chair  Level of Assistance Minimal assist, patient does 75% or more  Assistive Device Front wheel walker  Activity Response Tolerated well  Mobility Referral Yes  Mobility visit 1 Mobility  Mobility Specialist Start Time (ACUTE ONLY) 0940  Mobility Specialist Stop Time (ACUTE ONLY) 0956  Mobility Specialist Time Calculation (min) (ACUTE ONLY) 16 min    Pt received in bed, agreeable to mobility session. Daughter present and helpful. MinA for bed mobility to assist with trunk elevation and scooting EOB. MinA for STS. MinG for stand pivot to chair. Required mod cues to open her eyes throughout the session. Left in chair with all needs met, call bell in reach. Chair alarm on.   Deloria Fetch Mobility Specialist Please contact via SecureChat or Rehab office at [phone]

## 2023-08-12 NOTE — Progress Notes (Signed)
 Physical Therapy Treatment Patient Details Name: Dawn Roy MRN: [id number] DOB: Jun 20, 1929 Today's Date: 08/12/2023   History of Present Illness 89 y.o. female adm 08/08/23 with weakness, breast cellulitis and tachycardia. PMhx: breast CA s/p bil mastectomy with reconstruction, depression, HTN, vertigo, Afib, anxiety.    PT Comments  Pt received in supine, c/o fatigue and oriented to self and "hospital" but otherwise not oriented. Daughter present and encouraging her. Pt needing increased time/effort and multimodal cues to initiate and perform bed mobility and with notable brief episode of tachycardia, RN immediately called and notified. Pt HR returned to [facility] and pt asymptomatic other than stating "I might have been a little dizzy" and pt BP stable before/after postural changes. Pt attempted to perform STS x3 from EOB<>RW but unable to sequence properly (posterior lean) so attempted to assist pt via face to face for STS. Pt standing nearly fully upright but maintains posterior lean and needing up to TotalA +2 for transfer this date. Board updated with transfer recs given pt functional decline and RN notified of pt VS back to stable. Patient will benefit from continued inpatient follow up therapy, <3 hours/day, discussed with supervising PT Dawn Roy H and pt's daughter who seems agreeable to post-acute follow-up as she cannot provide constant +2 physical assist at home.     If plan is discharge home, recommend the following: Assistance with cooking/housework;Assist for transportation;Direct supervision/assist for medications management;Direct supervision/assist for financial management;Supervision due to cognitive status;Two people to help with walking and/or transfers;A lot of help with bathing/dressing/bathroom;Help with stairs or ramp for entrance   Can travel by private vehicle     No  Equipment Recommendations  None recommended by PT;Other (comment) (currently would need hoyer lift and ramp  for home, pending progress)    Recommendations for Other Services Other (comment) (may benefit from further cognitive work-up if pt not at baseline)     Precautions / Restrictions Precautions Precautions: Fall;Other (comment) Recall of Precautions/Restrictions: Impaired Precaution/Restrictions Comments: incontinent, many skin tears on her hand VERY sensitive Restrictions Weight Bearing Restrictions Per Provider Order: No     Mobility  Bed Mobility Overal bed mobility: Needs Assistance Bed Mobility: Rolling, Sidelying to Sit, Sit to Sidelying Rolling: Contact guard assist, Used rails Sidelying to sit: HOB elevated, Used rails, Mod assist     Sit to sidelying: HOB elevated, Used rails, Min assist General bed mobility comments: to L EOB, increased time/effort, pt very slow to initiate moving BLE toward EOB and unable to push away from mattress with LUE to lift trunk without HHA and pt requesting help, appears more due to cognition than ability limiting her. Visual/verbal demo for propping back on to her elbow for reverse log roll back to supine.    Transfers Overall transfer level: Needs assistance Equipment used: 2 person hand held assist (face to face HHA) Transfers: Sit to/from Stand Sit to Stand: Total assist           General transfer comment: Attempted x3 to have pt stand from EOB to RW with pt ignoring cues to push from bed or her knees and instead placing hands on RW and leaning backward, with poor BLE activation. On fourth trial, had pt hold back of PTA elbows with bil arms and bil knees/LE blocked for safety for improved anterior weight shifting, with pt stating "I can't, I can't, I can't" as she stood. Pt standing ~75% upright with maxA but totalA for stand>sit due to poor eccentric control. Pt appears internally distracted. PTA updated  her board as pt now needing +2 assist for transfers via hoyer or [employer].    Ambulation/Gait               General Gait  Details: pt unable today   Stairs             Wheelchair Mobility     Tilt Bed    Modified Rankin (Stroke Patients Only)       Balance Overall balance assessment: Needs assistance Sitting-balance support: Feet supported, Bilateral upper extremity supported, Single extremity supported Sitting balance-Leahy Scale: Fair   Postural control: Posterior lean Standing balance support: Bilateral upper extremity supported, During functional activity, Reliant on assistive device for balance Standing balance-Leahy Scale: Zero Standing balance comment: Face to face max to totalA with pt appearing to be limited due to cognition/anxiety more than due to ability. BP stable prior to and post-standing. See comments above for HR.                            Communication Communication Communication: Impaired Factors Affecting Communication: Hearing impaired  Cognition Arousal: Alert Behavior During Therapy: Anxious   PT - Cognitive impairments: Orientation, History of cognitive impairments, Initiation, Sequencing, Problem solving, Safety/Judgement, Attention, Memory, Awareness   Orientation impairments: Time, Situation                   PT - Cognition Comments: pt knows "hospital" but needs multiple prompts to name proper city and seems unaware of situation or month/year. Anxious and increased difficulty sequencing and following simple motor commands this date. Following commands: Impaired Following commands impaired: Follows one step commands inconsistently    Cueing Cueing Techniques: Verbal cues, Gestural cues, Visual cues, Tactile cues  Exercises      General Comments General comments (skin integrity, edema, etc.): BP 121/62 (76) HR 82 bpm; BP 131/69 HR 170-200 initially per dinamap finger sensor, then after ~30 seconds decreased to 80's-90's bpm. RN called immediately to notify, per daughter pt has a hx of Afib which is known per chart review.       Pertinent Vitals/Pain Pain Assessment Pain Assessment: Faces Faces Pain Scale: Hurts even more Pain Location: hands/wrists, feet/legs, bil knees, arm where BP cuff tightening Pain Descriptors / Indicators: Aching, Sore, Discomfort, Grimacing, Guarding, Moaning Pain Intervention(s): Limited activity within patient's tolerance, Monitored during session, Repositioned    Home Living                          Prior Function            PT Goals (current goals can now be found in the care plan section) Acute Rehab PT Goals Patient Stated Goal: return home PT Goal Formulation: With patient/family Time For Goal Achievement: 08/23/23 Progress towards PT goals: Not progressing toward goals - comment (functional decline)    Frequency    Min 1X/week      PT Plan      Co-evaluation              AM-PAC PT "6 Clicks" Mobility   Outcome Measure  Help needed turning from your back to your side while in a flat bed without using bedrails?: A Little Help needed moving from lying on your back to sitting on the side of a flat bed without using bedrails?: A Lot Help needed moving to and from a bed to a chair (including a wheelchair)?: Total  Help needed standing up from a chair using your arms (e.g., wheelchair or bedside chair)?: Total Help needed to walk in hospital room?: Total Help needed climbing 3-5 steps with a railing? : Total 6 Click Score: 9    End of Session Equipment Utilized During Treatment: Gait belt;Other (comment) (pt has breast binder donned during session) Activity Tolerance: Patient limited by pain;Other (comment);Treatment limited secondary to medical complications (Comment) (anxiety and cognitive deficit appear to be limiting her ability/sequencing this afternoon as well as intermittent tachycardia) Patient left: in bed;with call bell/phone within reach;with bed alarm set;with family/visitor present;Other (comment) (bed placed in chair posture, pt heels  floated for comfort) Nurse Communication: Mobility status;Need for lift equipment;Precautions;Other (comment) (pt HR with sitting EOB; currently at [employer] or [employer] level, board updated) PT Visit Diagnosis: Other abnormalities of gait and mobility (R26.89);Difficulty in walking, not elsewhere classified (R26.2)     Time: 1610-9604 PT Time Calculation (min) (ACUTE ONLY): 27 min  Charges:    $Therapeutic Activity: 23-37 mins PT General Charges $$ ACUTE PT VISIT: 1 Visit                     Dawn Bour P., PTA Acute Rehabilitation Services Secure Chat Preferred 9a-5:30pm Office: [phone]    Dawn Roy 08/12/2023, 3:31 PM

## 2023-08-13 DIAGNOSIS — L03818 Cellulitis of other sites: Secondary | ICD-10-CM | POA: Diagnosis not present

## 2023-08-13 NOTE — NC FL2 (Signed)
 [place]  MEDICAID FL2 LEVEL OF CARE FORM     IDENTIFICATION  Patient Name: Dawn Roy Birthdate: May 12, 1929 Sex: female Admission Date (Current Location): 08/08/2023  [facility] and [place] Number:  Producer, television/film/video and Address:  [facility], [address] [place], [place] [address]      Provider Number: [id number]  Attending Physician Name and Address:  Rosena Conradi, MD  Relative Name and Phone Number:  Dawn Roy, Dawn Roy (Daughter)  [phone]    Current Level of Care: Hospital Recommended Level of Care: Skilled Nursing Facility Prior Approval Number:    Date Approved/Denied:   PASRR Number: [id number]  Discharge Plan: SNF    Current Diagnoses: Patient Active Problem List   Diagnosis Date Noted   Dermatitis 08/12/2023   Hx of bilateral breast implants 08/12/2023   Sepsis (HCC) 08/10/2023   Right knee pain 08/10/2023   Chronic heart failure with preserved ejection fraction (HFpEF) (HCC) 08/10/2023   Cellulitis of right breast 08/10/2023   Spongiotic dermatitis 04/16/2023   Close exposure to COVID-19 virus 04/10/2023   Contamination of blood culture 04/09/2023   Depression 04/09/2023   B12 deficiency 04/09/2023   Cellulitis 04/07/2023   Persistent atrial fibrillation (HCC) 12/28/2022   Hypercoagulable state due to persistent atrial fibrillation (HCC) 12/28/2022   Primary hypertension 12/10/2022   Atrial fibrillation with rapid ventricular response (HCC) 12/09/2022    Orientation RESPIRATION BLADDER Height & Weight     Self  Normal Incontinent, External catheter Weight: 184 lb 8.4 oz (83.7 kg) Height:  5\' 2"  (157.5 cm)  BEHAVIORAL SYMPTOMS/MOOD NEUROLOGICAL BOWEL NUTRITION STATUS      Continent Diet (see DC summary)  AMBULATORY STATUS COMMUNICATION OF NEEDS Skin   Extensive Assist Verbally Normal                       Personal Care Assistance Level of Assistance  Bathing, Feeding, Dressing Bathing Assistance:  Maximum assistance Feeding assistance: Limited assistance Dressing Assistance: Maximum assistance     Functional Limitations Info  Sight, Hearing, Speech Sight Info: Impaired Hearing Info: Impaired Speech Info: Adequate    SPECIAL CARE FACTORS FREQUENCY  PT (By licensed PT), OT (By licensed OT)     PT Frequency: 5x/week OT Frequency: 5x/week            Contractures Contractures Info: Not present    Additional Factors Info  Code Status, Allergies Code Status Info: DNR Allergies Info: Azithromycin  [place] (Flurazepam)  Demerol (Meperidine)  Felodipine  Fosamax (Alendronate Sodium)  Hctz (Hydrochlorothiazide)  Ivp Dye (Iodinated Contrast Media)  Keflin (Cephalothin)  Macrodantin (Nitrofurantoin Macrocrystal)  Morphine And Codeine  Phenergan (Promethazine Hcl)  Valium (Diazepam)  Doxycycline   Keflex (Cephalexin)  Neosporin (Neomycin-bacitracin Zn-polymyx)  Penicillins           Current Medications (08/13/2023):  This is the current hospital active medication list Current Facility-Administered Medications  Medication Dose Route Frequency Provider Last Rate Last Admin   acetaminophen  (TYLENOL ) tablet 650 mg  650 mg Oral Q6H PRN Reesa Cannon N, DO       acidophilus (RISAQUAD) capsule 1 capsule  1 capsule Oral Daily [place], Carole N, DO   1 capsule at 08/13/23 1056   apixaban  (ELIQUIS ) tablet 5 mg  5 mg Oral BID Reesa Cannon N, DO   5 mg at 08/13/23 1056   citalopram  (CELEXA ) tablet 20 mg  20 mg Oral Daily Reesa Cannon N, DO   20 mg at 08/13/23 1056  diclofenac Sodium (VOLTAREN) 1 % topical gel 4 g  4 g Topical QID Amin, Sumayya, MD   4 g at 08/13/23 1458   hydrocortisone  1 % ointment   Topical BID Jodeane Mulligan, DO   Given at 08/13/23 1058   linezolid (ZYVOX) tablet 600 mg  600 mg Oral Q12H Liane Redman, MD   600 mg at 08/13/23 1056   melatonin tablet 5 mg  5 mg Oral QHS PRN Reesa Cannon N, DO   5 mg at 08/11/23 2144   metoprolol  succinate (TOPROL -XL) 24 hr tablet 12.5 mg   12.5 mg Oral Daily Reesa Cannon N, DO   12.5 mg at 08/13/23 1055   metoprolol  tartrate (LOPRESSOR ) injection 2.5 mg  2.5 mg Intravenous Q6H PRN Reesa Cannon N, DO   2.5 mg at 08/09/23 1617   ondansetron (ZOFRAN) injection 4 mg  4 mg Intravenous Q6H PRN Reesa Cannon N, DO       oxyCODONE (Oxy IR/ROXICODONE) immediate release tablet 5 mg  5 mg Oral Q6H PRN Hall, Carole N, DO   5 mg at 08/11/23 2144   polyethylene glycol (MIRALAX  / GLYCOLAX ) packet 17 g  17 g Oral Daily PRN Hall, Carole N, DO       silver sulfADIAZINE (SILVADENE) 1 % cream   Topical Daily Liane Redman, MD   Given at 08/13/23 1058     Discharge Medications: Please see discharge summary for a list of discharge medications.  Relevant Imaging Results:  Relevant Lab Results:   Additional Information ZOX:[id number]  Reiley Bertagnolli A [place], LCSW

## 2023-08-13 NOTE — TOC Progression Note (Signed)
 Transition of Care [facility]) - Progression Note    Patient Details  Name: Dawn Roy MRN: [id number] Date of Birth: 1930-02-17  [facility] CM/SW Contact  Juwan Vences A [place], LCSW Phone Number: 08/13/2023, 3:50 PM  Clinical Narrative:     CSW spoke with pt's daughter, Bartholomew Light regarding update on disposition for short term rehab. CSW explained short term rehab at [facility] process. Agreeable to SNF at discharge. CSW completed pt's workup, bed offers are pending. Preference for [facility].   TOC will continue to follow.         Expected Discharge Plan and Services                                               Social Determinants of Health (SDOH) Interventions SDOH Screenings   Food Insecurity: Unknown (08/09/2023)  Housing: Low Risk  (08/09/2023)  Transportation Needs: No Transportation Needs (08/09/2023)  Utilities: Not At Risk (08/09/2023)  Social Connections: Socially Isolated (08/09/2023)  Tobacco Use: Low Risk  (05/06/2023)    Readmission Risk Interventions    08/10/2023   11:31 AM  Readmission Risk Prevention Plan  Transportation Screening Complete  PCP or Specialist Appt within 5-7 Days Complete  Home Care Screening Complete  Medication Review (RN CM) Complete

## 2023-08-13 NOTE — Progress Notes (Signed)
 PROGRESS NOTE  Dawn Roy:[id number] DOB: Jan 14, 1930 DOA: 08/08/2023 PCP: Arva Lathe, MD   LOS: 5 days   Brief narrative:  89 y.o. female with past medical history significant for breast cancer status post bilateral mastectomy followed by gel implant in 1991, permanent atrial fibrillation on Eliquis , chronic anxiety/depression, hypertension, history of cellulitis involving bilateral breast silicone gel implants with admission from 04/07/2023 through 04/16/2023, history of picking at her breasts skin, presented to hospital with generalized weakness right breast tenderness swelling warmth and tenderness.  MRI of the chest with no concern for abscess or cellulitis but concern for implant leak.  Plastic surgery was consulted   Assessment/Plan: Principal Problem:   Cellulitis Active Problems:   Persistent atrial fibrillation (HCC)   Sepsis (HCC)   Right knee pain   Chronic heart failure with preserved ejection fraction (HFpEF) (HCC)   Cellulitis of right breast   Dermatitis   Hx of bilateral breast implants  Sepsis secondary to right breast cellulitis. Sepsis physiology has improved.  Continue antibiotics.  Right breast cellulitis - History of recurrent cellulitis with previous hospitalizations.  Patient follows with plastic surgery in the outpatient setting.   MRI chest with no concern of abscess or evident cellulitis but there was a concern of implant leak but this MRI was not very sensitive for that reason.  Plastic surgery has been consulted and seen the patient, will continue to follow recommendation.  ID was also consulted and had an impression of cellulitis with dermatitis.  Plan is to continue linezolid to complete 7-day course.  Continue with hydrocortisone  cream.    urinary tract infection Urine cultures with E. coli, shows resistance only to ampicillin, Augmentin, Cipro  and Bactrim .   Chronic right knee pain Imaging with severe osteoarthritis-need outpatient  evaluation.  Continue Voltaren gel.   Persistent atrial fibrillation Continue metoprolol  XL and Eliquis .  Telemetry on board.   Chronic HFpEF Appears to be compensated.   Chronic anxiety/depression Continue home Celexa .   Class I obesity. Body mass index is 33.75 kg/m.  Would benefit from weight loss as outpatient.  Physical debilitation muscle weakness PT/OT has recommended skilled nursing facility placement on discharge.   DVT prophylaxis:  apixaban  (ELIQUIS ) tablet 5 mg   Disposition: Skilled nursing facility as per PT evaluation  Status is: Inpatient Remains inpatient appropriate because: Pending clinical improvement, antibiotics, plastic surgery evaluation and follow-up,  need for rehabitation.    Code Status:     Code Status: Limited: Do not attempt resuscitation (DNR) -DNR-LIMITED -Do Not Intubate/DNI   Family Communication: Spoke with the patient's daughter at bedside  Consultants: Infectious disease Plastic surgery  Procedures: None  Anti-infectives:  Linezolid  Anti-infectives (From admission, onward)    Start     Dose/Rate Route Frequency Ordered Stop   08/13/23 1000  linezolid (ZYVOX) tablet 600 mg        600 mg Oral Every 12 hours 08/12/23 1410 08/15/23 0959   08/10/23 1100  cefTRIAXone (ROCEPHIN) 2 g in sodium chloride  0.9 % 100 mL IVPB  Status:  Discontinued        2 g 200 mL/hr over 30 Minutes Intravenous Daily 08/10/23 1013 08/12/23 1410   08/09/23 2300  vancomycin  (VANCOREADY) IVPB 500 mg/100 mL  Status:  Discontinued        500 mg 100 mL/hr over 60 Minutes Intravenous Every 24 hours 08/08/23 2105 08/09/23 1454   08/09/23 1545  DAPTOmycin (CUBICIN) IVPB 500 mg/60mL premix  Status:  Discontinued  8 mg/kg  62.1 kg (Adjusted) 100 mL/hr over 30 Minutes Intravenous Daily 08/09/23 1454 08/12/23 1410   08/08/23 2100  vancomycin  (VANCOREADY) IVPB 750 mg/150 mL  Status:  Discontinued        750 mg 150 mL/hr over 60 Minutes Intravenous Every  24 hours 08/08/23 2051 08/08/23 2105   08/08/23 2100  ceFEPIme (MAXIPIME) 2 g in sodium chloride  0.9 % 100 mL IVPB  Status:  Discontinued        2 g 200 mL/hr over 30 Minutes Intravenous Every 12 hours 08/08/23 2056 08/10/23 1013   08/08/23 2030  vancomycin  (VANCOCIN ) IVPB 1000 mg/200 mL premix        1,000 mg 200 mL/hr over 60 Minutes Intravenous  Once 08/08/23 2025 08/08/23 2209      Subjective: Today, patient was seen and examined at bedside.  Patient complains of mild cough and some itching over the chest wall.  Patient's daughter at bedside.  Denies any fevers, chills, nausea, vomiting  Objective: Vitals:   08/13/23 0436 08/13/23 0846  BP: 125/68 119/76  Pulse: 97 98  Resp: 19 17  Temp: 98.7 F (37.1 C) 98.5 F (36.9 C)  SpO2: 97% 97%    Intake/Output Summary (Last 24 hours) at 08/13/2023 1236 Last data filed at 08/13/2023 0442 Gross per 24 hour  Intake --  Output 1100 ml  Net -1100 ml   Filed Weights   08/11/23 0500 08/12/23 0456 08/13/23 0436  Weight: 84 kg 83.2 kg 83.7 kg   Body mass index is 33.75 kg/m.   Physical Exam:  GENERAL: Patient is alert awake, obese, not in obvious distress.  Alert female, Communicative, HENT: No scleral pallor or icterus. Pupils equally reactive to light. Oral mucosa is moist NECK: is supple, no gross swelling noted. CHEST: Clear to auscultation. No crackles or wheezes.  Diminished breath sounds bilaterally.  Right breast cellulitis with wound CVS: S1 and S2 heard, no murmur. Regular rate and rhythm.  ABDOMEN: Soft, non-tender, bowel sounds are present. EXTREMITIES: No edema. CNS: Cranial nerves are intact. No focal motor deficits. SKIN: warm and dry without rashes.  Right breast cellulitis.  Data Review: I have personally reviewed the following laboratory data and studies,  CBC: Recent Labs  Lab 08/08/23 1925 08/09/23 0550 08/10/23 0530 08/11/23 0602  WBC 10.4 10.7* 7.9 7.3  NEUTROABS 8.4*  --   --   --   HGB 12.9 12.2  10.5* 10.4*  HCT 39.9 38.1 32.3* 32.9*  MCV 91.5 91.1 91.0 92.7  PLT 348 312 271 269   Basic Metabolic Panel: Recent Labs  Lab 08/08/23 1925 08/09/23 0550 08/10/23 0530 08/11/23 0602  NA 137 135 136 135  K 4.0 4.0 4.1 4.5  CL 98 102 108 104  CO2 23 22 25 27   GLUCOSE 106* 87 91 90  BUN 17 19 20 23   CREATININE 1.07* 0.92 0.99 1.01*  CALCIUM 9.1 8.8* 8.4* 8.8*  MG  --  1.8  --   --   PHOS  --  3.0  --   --    Liver Function Tests: Recent Labs  Lab 08/08/23 1925  AST 21  ALT 9  ALKPHOS 57  BILITOT 1.4*  PROT 6.9  ALBUMIN 2.9*   No results for input(s): "LIPASE", "AMYLASE" in the last 168 hours. No results for input(s): "AMMONIA" in the last 168 hours. Cardiac Enzymes: Recent Labs  Lab 08/10/23 0530  CKTOTAL 134   BNP (last 3 results) Recent Labs    12/09/22  1515 08/08/23 1925  BNP 228.9* 205.6*    ProBNP (last 3 results) No results for input(s): "PROBNP" in the last 8760 hours.  CBG: No results for input(s): "GLUCAP" in the last 168 hours. Recent Results (from the past 240 hours)  Urine Culture     Status: Abnormal   Collection Time: 08/08/23  8:34 PM   Specimen: Urine, Random  Result Value Ref Range Status   Specimen Description URINE, RANDOM  Final   Special Requests   Final    NONE Reflexed from [id number] Performed at [facility], [address], [place], [place] [address]    Culture >=100,000 COLONIES/mL ESCHERICHIA COLI (A)  Final   Report Status 08/10/2023 FINAL  Final   Organism ID, Bacteria ESCHERICHIA COLI (A)  Final      Susceptibility   Escherichia coli - MIC*    AMPICILLIN >=32 RESISTANT Resistant     CEFAZOLIN <=4 SENSITIVE Sensitive     CEFEPIME <=0.12 SENSITIVE Sensitive     CEFTRIAXONE <=0.25 SENSITIVE Sensitive     CIPROFLOXACIN  >=4 RESISTANT Resistant     GENTAMICIN <=1 SENSITIVE Sensitive     IMIPENEM <=0.25 SENSITIVE Sensitive     NITROFURANTOIN <=16 SENSITIVE Sensitive     TRIMETH /SULFA  >=320 RESISTANT Resistant      AMPICILLIN/SULBACTAM 16 INTERMEDIATE Intermediate     PIP/TAZO <=4 SENSITIVE Sensitive ug/mL    * >=100,000 COLONIES/mL ESCHERICHIA COLI     Studies: No results found.    Kesi Perrow, MD  [facility] 08/13/2023  If 7PM-7AM, please contact night-coverage

## 2023-08-13 NOTE — Plan of Care (Signed)

## 2023-08-13 NOTE — Progress Notes (Addendum)
 Mobility Specialist: Progress Note   08/13/23 1516  Mobility  Activity Transferred from bed to chair  Level of Assistance Moderate assist, patient does 50-74%  Assistive Device Front wheel walker  Activity Response Tolerated well  Mobility Referral Yes  Mobility visit 1 Mobility  Mobility Specialist Start Time (ACUTE ONLY) 1030  Mobility Specialist Stop Time (ACUTE ONLY) 1045  Mobility Specialist Time Calculation (min) (ACUTE ONLY) 15 min    Pt received in bed, agreeable to mobility session. Daughter present and helpful. ModA for bed mobility to assist with scooting EOB and trunk elevation. ModA for STS. Took 3 attempts to stand, pt unable to push through knees to come to full stand on first 2 attempts. Required mod cues to open eyes throughout session. MinG to stand pivot to chair. Left in chair with all needs met, call bell in reach.   Deloria Fetch Mobility Specialist Please contact via SecureChat or Rehab office at [phone]

## 2023-08-13 NOTE — Progress Notes (Signed)
 Occupational Therapy Treatment Patient Details Name: Dawn Roy MRN: [id number] DOB: 03-Nov-1929 Today's Date: 08/13/2023   History of present illness 89 y.o. female adm 08/08/23 with weakness, breast cellulitis and tachycardia. PMhx: breast CA s/p bil mastectomy with reconstruction, depression, HTN, vertigo, Afib, anxiety.   OT comments  Pt requires mod A with BSC transfer and overall Total A with pericare and LB ADL tasks. Ableto stand for less than 1 min before requesting to sit down due to generalized weakness. Updated DC plan to post acute rehab. Fel Patient will benefit from continued inpatient follow up therapy, <3 hours/day. Daughter present and agrees with plan for rehab. Acute OT will continue to follow.        If plan is discharge home, recommend the following:  A lot of help with walking and/or transfers;A lot of help with bathing/dressing/bathroom;Direct supervision/assist for medications management;Direct supervision/assist for financial management;Assist for transportation;Help with stairs or ramp for entrance   Equipment Recommendations  None recommended by OT    Recommendations for Other Services      Precautions / Restrictions Precautions Precautions: Fall;Other (comment)       Mobility Bed Mobility Overal bed mobility: Needs Assistance Bed Mobility: Sit to Supine       Sit to supine: Mod assist   General bed mobility comments: A to lift both legs back onto bed    Transfers Overall transfer level: Needs assistance   Transfers: Sit to/from Stand, Bed to chair/wheelchair/BSC Sit to Stand: Mod assist     Step pivot transfers: Min assist     General transfer comment: tactile cues for foward weight shift; pt usually pulls up on RW with daughter holding it     Balance     Sitting balance-Leahy Scale: Fair       Standing balance-Leahy Scale: Poor                             ADL either performed or assessed with clinical judgement    ADL Overall ADL's : Needs assistance/impaired     Grooming: Set up               Lower Body Dressing: Total assistance   Toilet Transfer: Moderate assistance;Stand-pivot;Cueing for safety;Cueing for sequencing;BSC/3in1;Rolling walker (2 wheels)   Toileting- Clothing Manipulation and Hygiene: Total assistance;Sit to/from stand       Functional mobility during ADLs: Moderate assistance;Rolling walker (2 wheels);Cueing for safety;Cueing for sequencing      Extremity/Trunk Assessment Upper Extremity Assessment Upper Extremity Assessment: Generalized weakness   Lower Extremity Assessment Lower Extremity Assessment: Defer to PT evaluation        Vision       Perception     Praxis     Communication Communication Communication: Impaired Factors Affecting Communication: Hearing impaired   Cognition Arousal: Alert Behavior During Therapy: Anxious Cognition: Cognition impaired   Orientation impairments: Time, Situation Awareness: Intellectual awareness impaired, Online awareness impaired Memory impairment (select all impairments): Short-term memory, Working [employer] fast streamer, Conservation officer, historic buildings Attention impairment (select first level of impairment): Sustained attention Executive functioning impairment (select all impairments): Reasoning, Problem solving                   Following commands: Impaired Following commands impaired: Follows one step commands with increased time      Cueing   Cueing Techniques: Verbal cues, Tactile cues  Exercises      Shoulder Instructions  General Comments      Pertinent Vitals/ Pain       Pain Assessment Pain Assessment: Faces Faces Pain Scale: Hurts little more Pain Location: hands/wrists, feet/legs, bil knees, arm where BP cuff tightening Pain Descriptors / Indicators: Aching, Sore, Discomfort, Grimacing, Guarding, Moaning Pain Intervention(s): Limited activity within patient's tolerance  Home Living                                           Prior Functioning/Environment              Frequency  Min 2X/week        Progress Toward Goals  OT Goals(current goals can now be found in the care plan section)  Progress towards OT goals: Progressing toward goals  Acute Rehab OT Goals OT Goal Formulation: With patient/family Time For Goal Achievement: 08/23/23 Potential to Achieve Goals: Good ADL Goals Pt Will Perform Grooming: with contact guard assist;standing Pt Will Perform Upper Body Dressing: with set-up;sitting Pt Will Transfer to Toilet: ambulating;bedside commode;with supervision  Plan      Co-evaluation                 AM-PAC OT "6 Clicks" Daily Activity     Outcome Measure   Help from another person eating meals?: A Little Help from another person taking care of personal grooming?: A Little Help from another person toileting, which includes using toliet, bedpan, or urinal?: Total Help from another person bathing (including washing, rinsing, drying)?: A Lot Help from another person to put on and taking off regular upper body clothing?: A Lot Help from another person to put on and taking off regular lower body clothing?: Total 6 Click Score: 12    End of Session Equipment Utilized During Treatment: Gait belt;Rolling walker (2 wheels)  OT Visit Diagnosis: Unsteadiness on feet (R26.81);Muscle weakness (generalized) (M62.81)   Activity Tolerance Patient tolerated treatment well   Patient Left in bed;with call bell/phone within reach;with bed alarm set;with family/visitor present;Other (comment) (m,odified chair position)   Nurse Communication Mobility status        Time: [phone] OT Time Calculation (min): 20 min  Charges: OT General Charges $OT Visit: 1 Visit OT Treatments $Self Care/Home Management : 8-22 mins  Milburn Aliment, OT/L   Acute OT Clinical Specialist Acute Rehabilitation Services Pager [phone] Office  [phone]   [facility] 08/13/2023, 2:40 PM

## 2023-08-14 DIAGNOSIS — D649 Anemia, unspecified: Secondary | ICD-10-CM | POA: Diagnosis not present

## 2023-08-14 DIAGNOSIS — E871 Hypo-osmolality and hyponatremia: Secondary | ICD-10-CM | POA: Diagnosis not present

## 2023-08-14 DIAGNOSIS — N61 Mastitis without abscess: Secondary | ICD-10-CM | POA: Diagnosis not present

## 2023-08-14 LAB — CBC
HCT: 33.9 % — ABNORMAL LOW (ref 36.0–46.0)
Hemoglobin: 10.9 g/dL — ABNORMAL LOW (ref 12.0–15.0)
MCH: 29.1 pg (ref 26.0–34.0)
MCHC: 32.2 g/dL (ref 30.0–36.0)
MCV: 90.6 fL (ref 80.0–100.0)
Platelets: 275 10*3/uL (ref 150–400)
RBC: 3.74 MIL/uL — ABNORMAL LOW (ref 3.87–5.11)
RDW: 13.7 % (ref 11.5–15.5)
WBC: 6.7 10*3/uL (ref 4.0–10.5)
nRBC: 0 % (ref 0.0–0.2)

## 2023-08-14 LAB — IRON AND TIBC
Iron: 53 ug/dL (ref 28–170)
Saturation Ratios: 20 % (ref 10.4–31.8)
TIBC: 272 ug/dL (ref 250–450)
UIBC: 219 ug/dL

## 2023-08-14 LAB — BASIC METABOLIC PANEL WITH GFR
Anion gap: 5 (ref 5–15)
Anion gap: 10 (ref 5–15)
BUN: 20 mg/dL (ref 8–23)
BUN: 20 mg/dL (ref 8–23)
CO2: 29 mmol/L (ref 22–32)
CO2: 28 mmol/L (ref 22–32)
Calcium: 8.4 mg/dL — ABNORMAL LOW (ref 8.9–10.3)
Calcium: 9.1 mg/dL (ref 8.9–10.3)
Chloride: 97 mmol/L — ABNORMAL LOW (ref 98–111)
Chloride: 97 mmol/L — ABNORMAL LOW (ref 98–111)
Creatinine, Ser: 0.91 mg/dL (ref 0.44–1.00)
Creatinine, Ser: 1 mg/dL (ref 0.44–1.00)
GFR, Estimated: 59 mL/min — ABNORMAL LOW (ref >60)
GFR, Estimated: 53 mL/min — ABNORMAL LOW (ref >60)
Glucose, Bld: 96 mg/dL (ref 70–99)
Glucose, Bld: 130 mg/dL — ABNORMAL HIGH (ref 70–99)
Potassium: 4.3 mmol/L (ref 3.5–5.1)
Potassium: 4.1 mmol/L (ref 3.5–5.1)
Sodium: 131 mmol/L — ABNORMAL LOW (ref 135–145)
Sodium: 135 mmol/L (ref 135–145)

## 2023-08-14 LAB — FOLATE: Folate: 8.3 ng/mL (ref >5.9)

## 2023-08-14 LAB — VITAMIN B12: Vitamin B-12: 312 pg/mL (ref 180–914)

## 2023-08-14 NOTE — TOC Progression Note (Signed)
 Transition of Care [facility]) - Progression Note    Patient Details  Name: Dawn Roy MRN: [id number] Date of Birth: [birth year]  Transition of Care [facility]) CM/SW Contact  Jannice Mends, LCSW Phone Number: 08/14/2023, 11:03 AM  Clinical Narrative:    Requested [facility] review referral.         Expected Discharge Plan and Services                                               Social Determinants of Health (SDOH) Interventions SDOH Screenings   Food Insecurity: Unknown (08/09/2023)  Housing: Low Risk  (08/09/2023)  Transportation Needs: No Transportation Needs (08/09/2023)  Utilities: Not At Risk (08/09/2023)  Social Connections: Socially Isolated (08/09/2023)  Tobacco Use: Low Risk  (05/06/2023)    Readmission Risk Interventions    08/10/2023   11:31 AM  Readmission Risk Prevention Plan  Transportation Screening Complete  PCP or Specialist Appt within 5-7 Days Complete  Home Care Screening Complete  Medication Review (RN CM) Complete

## 2023-08-14 NOTE — Progress Notes (Signed)
 PROGRESS NOTE    Dawn Roy  UJW:[id number] DOB: [birth year] DOA: 08/08/2023 PCP: Arva Lathe, MD     Brief Narrative:  89 y.o. female with past medhx significant for breast cancer status post bilateral mastectomy followed by gel implant in 1991, permanent atrial fibrillation on Eliquis , chronic anxiety/depression, hypertension, history of cellulitis involving bilateral breast silicone gel implants with admission from 04/07/2023 through 04/16/2023, history of picking at skin of breast who presented to hospital with generalized weakness, right breast tenderness with swelling, warmth and tenderness.  MRI of the chest with no concern for abscess or cellulitis but concern for implant leak.  Plastic surgery was consulted   New events last 24 hours / Subjective: Na dropped from 135 to 131. Normocytic anemia noted. Pt waking up when I enter room. Appetite is fair. No pain or SOB. Awaiting placement to SNF.   Daughter reports some weakness.   Assessment & Plan:   Principal Problem:   Cellulitis  Appreciate ID recommendations  Linezolid  for total of 7 days, first dose on the morning of 6/6   Active Problems:   Persistent atrial fibrillation (HCC)  Eliquis  5 mg twice daily  Toprol -XL 12.5 mg daily    Sepsis (HCC)  Resolved, no leukocytosis, tachycardia, tachypnea, fever  Urine culture with E. Coli  Abx as above    Right knee pain  Voltaren  gel 4 times daily    Chronic heart failure with preserved ejection fraction (HFpEF) (HCC)     Dermatitis  Hydrocortisone  cream    Depression  Cont Celexa  20 mg/d    Hx of bilateral breast implants  Appreciate plastic surgery input     Hyponatremia  Resolved, likely due to poor oral intake    Normocytic anemia  Likely due to acute illness  Follow up labs WNL, levels stable  DVT prophylaxis: Eliquis  Code Status: Limited, no intubation Family Communication: Spoke with daughter on the phone Disposition Plan: SNF Barriers to  Discharge: Placement  Consultants:  ID Plastic surgery  Procedures:  None  Antimicrobials:  Anti-infectives (From admission, onward)    Start     Dose/Rate Route Frequency Ordered Stop   08/13/23 1000  linezolid  (ZYVOX ) tablet 600 mg        600 mg Oral Every 12 hours 08/12/23 1410 08/15/23 0959   08/10/23 1100  cefTRIAXone  (ROCEPHIN ) 2 g in sodium chloride  0.9 % 100 mL IVPB  Status:  Discontinued        2 g 200 mL/hr over 30 Minutes Intravenous Daily 08/10/23 1013 08/12/23 1410   08/09/23 2300  vancomycin  (VANCOREADY) IVPB 500 mg/100 mL  Status:  Discontinued        500 mg 100 mL/hr over 60 Minutes Intravenous Every 24 hours 08/08/23 2105 08/09/23 1454   08/09/23 1545  DAPTOmycin  (CUBICIN ) IVPB 500 mg/27mL premix  Status:  Discontinued        8 mg/kg  62.1 kg (Adjusted) 100 mL/hr over 30 Minutes Intravenous Daily 08/09/23 1454 08/12/23 1410   08/08/23 2100  vancomycin  (VANCOREADY) IVPB 750 mg/150 mL  Status:  Discontinued        750 mg 150 mL/hr over 60 Minutes Intravenous Every 24 hours 08/08/23 2051 08/08/23 2105   08/08/23 2100  ceFEPIme  (MAXIPIME ) 2 g in sodium chloride  0.9 % 100 mL IVPB  Status:  Discontinued        2 g 200 mL/hr over 30 Minutes Intravenous Every 12 hours 08/08/23 2056 08/10/23 1013   08/08/23 2030  vancomycin  (VANCOCIN ) IVPB 1000  mg/200 mL premix        1,000 mg 200 mL/hr over 60 Minutes Intravenous  Once 08/08/23 2025 08/08/23 2209        Objective: Vitals:   08/13/23 2355 08/14/23 0416 08/14/23 0811 08/14/23 1250  BP: 130/69 125/61 (!) 135/55 118/62  Pulse: 95 88 74 75  Resp: 19 19 15 17   Temp: 98.4 F (36.9 C) 98.5 F (36.9 C) (!) 97.5 F (36.4 C) (!) 97.4 F (36.3 C)  TempSrc:      SpO2: 95% 95% 95% 98%  Weight:  83.7 kg    Height:        Intake/Output Summary (Last 24 hours) at 08/14/2023 1346 Last data filed at 08/14/2023 1252 Gross per 24 hour  Intake 493 ml  Output 1500 ml  Net -1007 ml   Filed Weights   08/12/23 0456 08/13/23  0436 08/14/23 0416  Weight: 83.2 kg 83.7 kg 83.7 kg    Examination:  General exam: Appears calm and comfortable  Respiratory system: Clear to auscultation. Respiratory effort normal. No respiratory distress. No conversational dyspnea.  Cardiovascular system: S1 & S2 heard, RRR. Gastrointestinal system: Abdomen is nondistended, soft and nontender. Normal bowel sounds heard. Central nervous system: Alert and answers questions appropriately Extremities: Symmetric in appearance  Skin: No rashes, lesions or ulcers on exposed skin  Psychiatry: Judgement and insight appear limited. Affect appropriate.   Data Reviewed: I have personally reviewed following labs and imaging studies  CBC: Recent Labs  Lab 08/08/23 1925 08/09/23 0550 08/10/23 0530 08/11/23 0602 08/14/23 0315  WBC 10.4 10.7* 7.9 7.3 6.7  NEUTROABS 8.4*  --   --   --   --   HGB 12.9 12.2 10.5* 10.4* 10.9*  HCT 39.9 38.1 32.3* 32.9* 33.9*  MCV 91.5 91.1 91.0 92.7 90.6  PLT 348 312 271 269 275   Basic Metabolic Panel: Recent Labs  Lab 08/09/23 0550 08/10/23 0530 08/11/23 0602 08/14/23 0315 08/14/23 1028  NA 135 136 135 131* 135  K 4.0 4.1 4.5 4.3 4.1  CL 102 108 104 97* 97*  CO2 22 25 27 29 28   GLUCOSE 87 91 90 96 130*  BUN 19 20 23 20 20   CREATININE 0.92 0.99 1.01* 0.91 1.00  CALCIUM 8.8* 8.4* 8.8* 8.4* 9.1  MG 1.8  --   --   --   --   PHOS 3.0  --   --   --   --    GFR: Estimated Creatinine Clearance: 36 mL/min (by C-G formula based on SCr of 1 mg/dL). Liver Function Tests: Recent Labs  Lab 08/08/23 1925  AST 21  ALT 9  ALKPHOS 57  BILITOT 1.4*  PROT 6.9  ALBUMIN 2.9*   Coagulation Profile: Recent Labs  Lab 08/08/23 1925  INR 1.1   Cardiac Enzymes: Recent Labs  Lab 08/10/23 0530  CKTOTAL 134   Anemia Panel: Recent Labs    08/14/23 1028  VITAMINB12 312  FOLATE 8.3  TIBC 272  IRON 53   Sepsis Labs: Recent Labs  Lab 08/08/23 1941 08/08/23 2135  LATICACIDVEN 2.1* 1.2    Recent  Results (from the past 240 hours)  Urine Culture     Status: Abnormal   Collection Time: 08/08/23  8:34 PM   Specimen: Urine, Random  Result Value Ref Range Status   Specimen Description URINE, RANDOM  Final   Special Requests   Final    NONE Reflexed from [id number] Performed at [facility], [address] [place], [place] [address]    Culture >=100,000 COLONIES/mL ESCHERICHIA COLI (A)  Final   Report Status 08/10/2023 FINAL  Final   Organism ID, Bacteria ESCHERICHIA COLI (A)  Final      Susceptibility   Escherichia coli - MIC*    AMPICILLIN >=32 RESISTANT Resistant     CEFAZOLIN <=4 SENSITIVE Sensitive     CEFEPIME  <=0.12 SENSITIVE Sensitive     CEFTRIAXONE  <=0.25 SENSITIVE Sensitive     CIPROFLOXACIN  >=4 RESISTANT Resistant     GENTAMICIN <=1 SENSITIVE Sensitive     IMIPENEM <=0.25 SENSITIVE Sensitive     NITROFURANTOIN <=16 SENSITIVE Sensitive     TRIMETH /SULFA  >=320 RESISTANT Resistant     AMPICILLIN/SULBACTAM 16 INTERMEDIATE Intermediate     PIP/TAZO <=4 SENSITIVE Sensitive ug/mL    * >=100,000 COLONIES/mL ESCHERICHIA COLI     Scheduled Meds:  acidophilus  1 capsule Oral Daily   apixaban   5 mg Oral BID   citalopram   20 mg Oral Daily   diclofenac  Sodium  4 g Topical QID   hydrocortisone    Topical BID   linezolid   600 mg Oral Q12H   metoprolol  succinate  12.5 mg Oral Daily   silver  sulfADIAZINE    Topical Daily   Continuous Infusions:   LOS: 6 days    Time spent: 35 minutes   Jobe Mulder, DO [facility] 08/14/2023, 1:46 PM   Available via Epic secure chat 7am-7pm After these hours, please refer to coverage provider listed on [email or web address]

## 2023-08-14 NOTE — Progress Notes (Addendum)
 Mobility Specialist Progress Note;   08/14/23 1004  Mobility  Activity Transferred from bed to chair  Level of Assistance Moderate assist, patient does 50-74%  Assistive Device Front wheel walker  Distance Ambulated (ft) 3 ft  Activity Response Tolerated well  Mobility Referral Yes  Mobility visit 1 Mobility  Mobility Specialist Start Time (ACUTE ONLY) 1004  Mobility Specialist Stop Time (ACUTE ONLY) 1018  Mobility Specialist Time Calculation (min) (ACUTE ONLY) 14 min   Pt agreeable to mobility. Required MinA to come EoB and to assist w/ trunk, ModA to stand from EoB and pivot over to chair. VC required for foot progression and safety. No c/o when asked. Pt left in chair with all needs met, alarm on. Nursing student made aware.  Dawn Roy Mobility Specialist Please contact via SecureChat or [employer] [phone]

## 2023-08-14 NOTE — Plan of Care (Signed)

## 2023-08-15 DIAGNOSIS — N61 Mastitis without abscess: Secondary | ICD-10-CM | POA: Diagnosis not present

## 2023-08-15 LAB — BASIC METABOLIC PANEL WITH GFR
Anion gap: 6 (ref 5–15)
BUN: 21 mg/dL (ref 8–23)
CO2: 29 mmol/L (ref 22–32)
Calcium: 8.8 mg/dL — ABNORMAL LOW (ref 8.9–10.3)
Chloride: 99 mmol/L (ref 98–111)
Creatinine, Ser: 1.09 mg/dL — ABNORMAL HIGH (ref 0.44–1.00)
GFR, Estimated: 48 mL/min — ABNORMAL LOW (ref >60)
Glucose, Bld: 87 mg/dL (ref 70–99)
Potassium: 4.4 mmol/L (ref 3.5–5.1)
Sodium: 134 mmol/L — ABNORMAL LOW (ref 135–145)

## 2023-08-15 LAB — CBC
HCT: 33.3 % — ABNORMAL LOW (ref 36.0–46.0)
Hemoglobin: 10.6 g/dL — ABNORMAL LOW (ref 12.0–15.0)
MCH: 29 pg (ref 26.0–34.0)
MCHC: 31.8 g/dL (ref 30.0–36.0)
MCV: 91 fL (ref 80.0–100.0)
Platelets: 290 10*3/uL (ref 150–400)
RBC: 3.66 MIL/uL — ABNORMAL LOW (ref 3.87–5.11)
RDW: 13.8 % (ref 11.5–15.5)
WBC: 7 10*3/uL (ref 4.0–10.5)
nRBC: 0 % (ref 0.0–0.2)

## 2023-08-15 NOTE — Plan of Care (Signed)

## 2023-08-15 NOTE — Progress Notes (Signed)
 PROGRESS NOTE    Dawn Roy  ZOX:[id number] DOB: 1929-05-24 DOA: 08/08/2023 PCP: Arva Lathe, MD     Brief Narrative:  89 y.o. female with past medhx significant for breast cancer status post bilateral mastectomy followed by gel implant in 1991, permanent atrial fibrillation on Eliquis , chronic anxiety/depression, hypertension, history of cellulitis involving bilateral breast silicone gel implants with admission from 04/07/2023 through 04/16/2023, history of picking at skin of breast who presented to hospital with generalized weakness, right breast tenderness with swelling, warmth and tenderness.  MRI of the chest with no concern for abscess or cellulitis but concern for implant leak.  Plastic surgery was consulted    New events last 24 hours / Subjective: Still awaiting SNF placement.  She reports feeling better today.  Appears to have more energy.  Net negative output could explain sodium.  Could consider gentle hydration if she is not eating.  Assessment & Plan:   Principal Problem:   Cellulitis             Appreciate ID recommendations             Linezolid  for total of 7 days, first dose on the morning of 6/6     Active Problems:   Persistent atrial fibrillation (HCC)             Eliquis  5 mg twice daily             Toprol -XL 12.5 mg daily     Sepsis (HCC)             Resolved, no leukocytosis, tachycardia, tachypnea, fever             Urine culture with E. Coli             Abx as above     Right knee pain             Voltaren  gel 4 times daily     Chronic heart failure with preserved ejection fraction (HFpEF) (HCC)  No signs of decompensation                Dermatitis             Hydrocortisone  cream     Depression             Cont Celexa  20 mg/d     Hx of bilateral breast implants             Appreciate plastic surgery input                Hyponatremia             Likely due to poor oral intake   Cont to monitor     Normocytic anemia              Likely due to acute illness             Follow up labs WNL, levels stable   DVT prophylaxis: Eliquis  Code Status: Limited, no intubation Family Communication: Spoke with daughter on the phone Disposition Plan: SNF Barriers to Discharge: Placement   Consultants:  ID Plastic surgery   Procedures:  None  Antimicrobials:  Anti-infectives (From admission, onward)    Start     Dose/Rate Route Frequency Ordered Stop   08/13/23 1000  linezolid  (ZYVOX ) tablet 600 mg        600 mg Oral Every 12 hours 08/12/23 1410 08/14/23 2130   08/10/23 1100  cefTRIAXone  (ROCEPHIN ) 2 g in  sodium chloride  0.9 % 100 mL IVPB  Status:  Discontinued        2 g 200 mL/hr over 30 Minutes Intravenous Daily 08/10/23 1013 08/12/23 1410   08/09/23 2300  vancomycin  (VANCOREADY) IVPB 500 mg/100 mL  Status:  Discontinued        500 mg 100 mL/hr over 60 Minutes Intravenous Every 24 hours 08/08/23 2105 08/09/23 1454   08/09/23 1545  DAPTOmycin  (CUBICIN ) IVPB 500 mg/84mL premix  Status:  Discontinued        8 mg/kg  62.1 kg (Adjusted) 100 mL/hr over 30 Minutes Intravenous Daily 08/09/23 1454 08/12/23 1410   08/08/23 2100  vancomycin  (VANCOREADY) IVPB 750 mg/150 mL  Status:  Discontinued        750 mg 150 mL/hr over 60 Minutes Intravenous Every 24 hours 08/08/23 2051 08/08/23 2105   08/08/23 2100  ceFEPIme  (MAXIPIME ) 2 g in sodium chloride  0.9 % 100 mL IVPB  Status:  Discontinued        2 g 200 mL/hr over 30 Minutes Intravenous Every 12 hours 08/08/23 2056 08/10/23 1013   08/08/23 2030  vancomycin  (VANCOCIN ) IVPB 1000 mg/200 mL premix        1,000 mg 200 mL/hr over 60 Minutes Intravenous  Once 08/08/23 2025 08/08/23 2209        Objective: Vitals:   08/14/23 1250 08/14/23 1657 08/15/23 0536 08/15/23 0818  BP: 118/62 109/80 (!) 115/55 127/63  Pulse: 75 76 70 85  Resp: 17 15 19 17   Temp: (!) 97.4 F (36.3 C) 97.7 F (36.5 C) (!) 97.4 F (36.3 C) 97.8 F (36.6 C)  TempSrc:      SpO2: 98% 99% 98% 95%   Weight:   83.7 kg   Height:        Intake/Output Summary (Last 24 hours) at 08/15/2023 1350 Last data filed at 08/15/2023 1328 Gross per 24 hour  Intake 218 ml  Output 1150 ml  Net -932 ml   Filed Weights   08/13/23 0436 08/14/23 0416 08/15/23 0536  Weight: 83.7 kg 83.7 kg 83.7 kg    Examination:  General exam: Appears calm and comfortable  Respiratory system: Clear to auscultation. Respiratory effort normal. No respiratory distress. No conversational dyspnea.  Cardiovascular system: S1 & S2 heard, RRR. No pedal edema. Central nervous system: Alert. Extremities: Symmetric in appearance  Skin: No rashes, lesions or ulcers on exposed skin  Psychiatry: Mood & affect appropriate.   Data Reviewed: I have personally reviewed following labs and imaging studies  CBC: Recent Labs  Lab 08/08/23 1925 08/09/23 0550 08/10/23 0530 08/11/23 0602 08/14/23 0315 08/15/23 0816  WBC 10.4 10.7* 7.9 7.3 6.7 7.0  NEUTROABS 8.4*  --   --   --   --   --   HGB 12.9 12.2 10.5* 10.4* 10.9* 10.6*  HCT 39.9 38.1 32.3* 32.9* 33.9* 33.3*  MCV 91.5 91.1 91.0 92.7 90.6 91.0  PLT 348 312 271 269 275 290   Basic Metabolic Panel: Recent Labs  Lab 08/09/23 0550 08/10/23 0530 08/11/23 0602 08/14/23 0315 08/14/23 1028 08/15/23 0816  NA 135 136 135 131* 135 134*  K 4.0 4.1 4.5 4.3 4.1 4.4  CL 102 108 104 97* 97* 99  CO2 22 25 27 29 28 29   GLUCOSE 87 91 90 96 130* 87  BUN 19 20 23 20 20 21   CREATININE 0.92 0.99 1.01* 0.91 1.00 1.09*  CALCIUM 8.8* 8.4* 8.8* 8.4* 9.1 8.8*  MG 1.8  --   --   --   --   --  PHOS 3.0  --   --   --   --   --    GFR: Estimated Creatinine Clearance: 33 mL/min (A) (by C-G formula based on SCr of 1.09 mg/dL (H)).  Liver Function Tests: Recent Labs  Lab 08/08/23 1925  AST 21  ALT 9  ALKPHOS 57  BILITOT 1.4*  PROT 6.9  ALBUMIN 2.9*   Coagulation Profile: Recent Labs  Lab 08/08/23 1925  INR 1.1   Cardiac Enzymes: Recent Labs  Lab 08/10/23 0530  CKTOTAL  134   Anemia Panel: Recent Labs    08/14/23 1028  VITAMINB12 312  FOLATE 8.3  TIBC 272  IRON 53   Sepsis Labs: Recent Labs  Lab 08/08/23 1941 08/08/23 2135  LATICACIDVEN 2.1* 1.2    Recent Results (from the past 240 hours)  Urine Culture     Status: Abnormal   Collection Time: 08/08/23  8:34 PM   Specimen: Urine, Random  Result Value Ref Range Status   Specimen Description URINE, RANDOM  Final   Special Requests   Final    NONE Reflexed from [id number] Performed at [facility], [address] [place], [place] [address]    Culture >=100,000 COLONIES/mL ESCHERICHIA COLI (A)  Final   Report Status 08/10/2023 FINAL  Final   Organism ID, Bacteria ESCHERICHIA COLI (A)  Final      Susceptibility   Escherichia coli - MIC*    AMPICILLIN >=32 RESISTANT Resistant     CEFAZOLIN <=4 SENSITIVE Sensitive     CEFEPIME  <=0.12 SENSITIVE Sensitive     CEFTRIAXONE  <=0.25 SENSITIVE Sensitive     CIPROFLOXACIN  >=4 RESISTANT Resistant     GENTAMICIN <=1 SENSITIVE Sensitive     IMIPENEM <=0.25 SENSITIVE Sensitive     NITROFURANTOIN <=16 SENSITIVE Sensitive     TRIMETH /SULFA  >=320 RESISTANT Resistant     AMPICILLIN/SULBACTAM 16 INTERMEDIATE Intermediate     PIP/TAZO <=4 SENSITIVE Sensitive ug/mL    * >=100,000 COLONIES/mL ESCHERICHIA COLI     Scheduled Meds:  acidophilus  1 capsule Oral Daily   apixaban   5 mg Oral BID   citalopram   20 mg Oral Daily   diclofenac  Sodium  4 g Topical QID   hydrocortisone    Topical BID   metoprolol  succinate  12.5 mg Oral Daily   silver  sulfADIAZINE    Topical Daily   Continuous Infusions:   LOS: 7 days    Time spent: 28 minutes   Jobe Mulder, DO [facility] 08/15/2023, 1:50 PM   Available via Epic secure chat 7am-7pm After these hours, please refer to coverage provider listed on [email or web address]

## 2023-08-16 DIAGNOSIS — L03818 Cellulitis of other sites: Secondary | ICD-10-CM | POA: Diagnosis not present

## 2023-08-16 DIAGNOSIS — I5032 Chronic diastolic (congestive) heart failure: Secondary | ICD-10-CM | POA: Diagnosis not present

## 2023-08-16 DIAGNOSIS — M25561 Pain in right knee: Secondary | ICD-10-CM | POA: Diagnosis not present

## 2023-08-16 DIAGNOSIS — I4819 Other persistent atrial fibrillation: Secondary | ICD-10-CM | POA: Diagnosis not present

## 2023-08-16 LAB — CBC
HCT: 34.2 % — ABNORMAL LOW (ref 36.0–46.0)
Hemoglobin: 10.9 g/dL — ABNORMAL LOW (ref 12.0–15.0)
MCH: 29.3 pg (ref 26.0–34.0)
MCHC: 31.9 g/dL (ref 30.0–36.0)
MCV: 91.9 fL (ref 80.0–100.0)
Platelets: 289 10*3/uL (ref 150–400)
RBC: 3.72 MIL/uL — ABNORMAL LOW (ref 3.87–5.11)
RDW: 13.5 % (ref 11.5–15.5)
WBC: 6.4 10*3/uL (ref 4.0–10.5)
nRBC: 0 % (ref 0.0–0.2)

## 2023-08-16 LAB — BASIC METABOLIC PANEL WITH GFR
Anion gap: 8 (ref 5–15)
BUN: 27 mg/dL — ABNORMAL HIGH (ref 8–23)
CO2: 27 mmol/L (ref 22–32)
Calcium: 8.8 mg/dL — ABNORMAL LOW (ref 8.9–10.3)
Chloride: 100 mmol/L (ref 98–111)
Creatinine, Ser: 1.24 mg/dL — ABNORMAL HIGH (ref 0.44–1.00)
GFR, Estimated: 41 mL/min — ABNORMAL LOW (ref >60)
Glucose, Bld: 85 mg/dL (ref 70–99)
Potassium: 4.6 mmol/L (ref 3.5–5.1)
Sodium: 135 mmol/L (ref 135–145)

## 2023-08-16 MED ORDER — LACTATED RINGERS IV SOLN: INTRAVENOUS | Status: AC

## 2023-08-16 NOTE — Progress Notes (Signed)
 Progress Note   Patient: Dawn Roy ZOX:[id number] DOB: 07-18-1929 DOA: 08/08/2023  DOS: the patient was seen and examined on 08/16/2023   Brief hospital course:  89 y.o. female with medical history significant for breast cancer status post bilateral mastectomy followed by gel implant in 1991, permanent atrial fibrillation on Eliquis , chronic anxiety/depression, hypertension, history of cellulitis involving bilateral breast silicone gel implants with admission from 04/07/2023 through 04/16/2023, history of picking at her breasts skin, who presents today with complaints of generalized weakness and right breast redness, swelling, warmth, tenderness.  Her symptoms are worsening for the past few days.  The patient admits to picking at her breasts even in her sleep despite wearing gloves.    6/4: Hemodynamically stable.  MRI chest with no concern of abscess or obvious cellulitis.  Concern of some implant leak.  6/9: Hemodynamically stable.  Patient is high risk for implant removal and plastic surgery does not think that current cellulitis is related to that leg.  It was most likely due to picking on skin. Plastic surgery to discuss with family before final recommendations.   Assessment and Plan:   Sepsis - Tachycardia, tachypnea, elevated lactic acidosis with right breast cellulitis give concern for sepsis on presentation.  Given aggressive IV fluid hydration, blood cultures, empiric antibiotics.  Lactate resolved.  Monitor blood pressure closely.   Right breast cellulitis - History of recurrent cellulitis with previous hospitalizations.  Reportedly patient admits to picking/scratching at her breasts even in her sleep.  Attempts to wear gloves/mitts have not resolved issue.  Recent admission for same, previous involving bilateral breast with concern for implant infection.  Follows with plastic surgery in the outpatient setting.  MRI pending to evaluate for abscess or peri-implant infection.  If  present, would need evaluation by plastic surgeon.  Infectious disease consulted and following closely.  MRI chest with no concern of abscess or evident cellulitis but there was a concern of implant leak but this MRI was not very sensitive for that reason. - Completed a course of antibiotic - Plastic surgery does not think that the cellulitis is related to implant leak and removal of plant will be high risk for her   urinary tract infection Urine cultures with E. coli, shows resistance only to ampicillin, Augmentin, Cipro  and Bactrim . - Completed a course of antibiotic  Chronic right knee pain - Per report from family, patient has had right knee pain for some time, but apparently recently is worsening, sharp in nature.  Requesting Ortho evaluation.   Imaging with severe osteoarthritis-need outpatient evaluation   Persistent atrial fibrillation - Resume metoprolol  XL and Eliquis .     Chronic HFpEF - Does not not appear to be in acute exacerbation.  Euvolemic.  No hypoxia.   Chronic anxiety/depression - Resume home Celexa .   Physical debilitation muscle weakness - PT/OT recommending SNF  Subjective: Patient was seen and examined today.  No new concern.  Physical Exam:  Vitals:   08/15/23 1716 08/15/23 2033 08/16/23 0431 08/16/23 0835  BP: 133/67 101/83 (!) 120/54 130/67  Pulse: 93 74 80 76  Resp: 19 19 18 18   Temp: 97.8 F (36.6 C) (!) 97.5 F (36.4 C) 98.1 F (36.7 C)   TempSrc: Oral     SpO2: 97% 99% 94% 96%  Weight:   83.7 kg   Height:        General.  Frail elderly lady, in no acute distress. Pulmonary.  Lungs clear bilaterally, normal respiratory effort. CV.  Regular rate and  rhythm, no JVD, rub or murmur. Abdomen.  Soft, nontender, nondistended, BS positive. CNS.  Alert and oriented .  No focal neurologic deficit. Extremities.  No edema, no cyanosis, pulses intact and symmetrical.  Data Reviewed: Prior data reviewed  Labs: CBC: Recent Labs  Lab  08/10/23 0530 08/11/23 0602 08/14/23 0315 08/15/23 0816 08/16/23 0437  WBC 7.9 7.3 6.7 7.0 6.4  HGB 10.5* 10.4* 10.9* 10.6* 10.9*  HCT 32.3* 32.9* 33.9* 33.3* 34.2*  MCV 91.0 92.7 90.6 91.0 91.9  PLT 271 269 275 290 289   Basic Metabolic Panel: Recent Labs  Lab 08/11/23 0602 08/14/23 0315 08/14/23 1028 08/15/23 0816 08/16/23 0437  NA 135 131* 135 134* 135  K 4.5 4.3 4.1 4.4 4.6  CL 104 97* 97* 99 100  CO2 27 29 28 29 27   GLUCOSE 90 96 130* 87 85  BUN 23 20 20 21  27*  CREATININE 1.01* 0.91 1.00 1.09* 1.24*  CALCIUM 8.8* 8.4* 9.1 8.8* 8.8*   Liver Function Tests: No results for input(s): "AST", "ALT", "ALKPHOS", "BILITOT", "PROT", "ALBUMIN" in the last 168 hours.  CBG: No results for input(s): "GLUCAP" in the last 168 hours.  Scheduled Meds:  acidophilus  1 capsule Oral Daily   apixaban   5 mg Oral BID   citalopram   20 mg Oral Daily   diclofenac  Sodium  4 g Topical QID   hydrocortisone    Topical BID   metoprolol  succinate  12.5 mg Oral Daily   silver  sulfADIAZINE    Topical Daily   Continuous Infusions:  lactated ringers  75 mL/hr at 08/16/23 1006   PRN Meds:.acetaminophen , melatonin, metoprolol  tartrate, ondansetron  (ZOFRAN ) IV, oxyCODONE , polyethylene glycol  Family Communication: None at bedside  Disposition: Status is: Inpatient Remains inpatient appropriate because: Cellulitis requiring IV antibiotics   DVT prophylaxis.  Eliquis  Time spent: 44 minutes  Length of inpatient stay: 8 days  Author: Luna Salinas, MD 08/16/2023 3:54 PM  For on call review [email or web address].

## 2023-08-16 NOTE — Progress Notes (Addendum)
 Mobility Specialist: Progress Note   08/16/23 1233  Mobility  Activity Ambulated with assistance in room  Level of Assistance Contact guard assist, steadying assist  Assistive Device Front wheel walker  Distance Ambulated (ft) 20 ft  Activity Response Tolerated well  Mobility Referral Yes  Mobility visit 1 Mobility  Mobility Specialist Start Time (ACUTE ONLY) 1048  Mobility Specialist Stop Time (ACUTE ONLY) 1056  Mobility Specialist Time Calculation (min) (ACUTE ONLY) 8 min    Pt received in BR, agreeable to mobility. Daughter and NT present and helpful. MinA for STS from commode. Void and BM successful, MS assisted with pericare in standing. CG for ambulation to the chair. No complaints. Left in chair with all needs met, call bell in reach. Chair alarm on.   Deloria Fetch Mobility Specialist Please contact via SecureChat or Rehab office at [phone]

## 2023-08-16 NOTE — Plan of Care (Signed)
 Pain managed with prescribed pain meds x1 dose this shift. Effective in pain relief aeb patient sleeping, resp even and unlabored.  Patient picks at skin while sleeping and removes the compression device in place. Patient reinstructed to not remove the binder and to avoid scratching at areas on breasts. Patient states "I didn't do that"

## 2023-08-16 NOTE — Progress Notes (Signed)
 Ms. Klinkner is a 89 year old female well-known to me with recurrent erythema of the bilateral reconstructed breasts.  Patient has both tissue flaps and implants as part of her reconstruction.  She was admitted approximately 8 days ago with recurrent erythema along the breast.  MRI did not show any evidence of abscess or true cellulitis.  Her findings were consistent again with the erythema from her repeatedly picking at the skin.  I had a long discussion with Ms. Blithe and her daughter on Friday and again today.  I do not believe that it is in her best interest to remove the implants.  There is no evidence that they are infected.  Even if there is a rupture of given her age and medical status there is no evidence that a clinically insignificant rupture requires removal of the implants.  I have told them this and they had a family discussion over the weekend they would like to leave the implants in place at this time and I fully support this.  If it anytime it becomes clear that the implants are truly the cause of her recurrent erythema then we can revisit this decision and consider removal of the implants.  I strongly recommend continuing to deny her access to the breast that she stop scratching them.  I am available as needed and will follow her up in the office if she and the family desire.

## 2023-08-16 NOTE — Plan of Care (Signed)

## 2023-08-17 DIAGNOSIS — L03818 Cellulitis of other sites: Secondary | ICD-10-CM | POA: Diagnosis not present

## 2023-08-17 DIAGNOSIS — I5032 Chronic diastolic (congestive) heart failure: Secondary | ICD-10-CM | POA: Diagnosis not present

## 2023-08-17 DIAGNOSIS — M25561 Pain in right knee: Secondary | ICD-10-CM | POA: Diagnosis not present

## 2023-08-17 DIAGNOSIS — I4819 Other persistent atrial fibrillation: Secondary | ICD-10-CM | POA: Diagnosis not present

## 2023-08-17 LAB — BASIC METABOLIC PANEL WITH GFR
Anion gap: 7 (ref 5–15)
BUN: 25 mg/dL — ABNORMAL HIGH (ref 8–23)
CO2: 27 mmol/L (ref 22–32)
Calcium: 8.5 mg/dL — ABNORMAL LOW (ref 8.9–10.3)
Chloride: 100 mmol/L (ref 98–111)
Creatinine, Ser: 1 mg/dL (ref 0.44–1.00)
GFR, Estimated: 53 mL/min — ABNORMAL LOW (ref >60)
Glucose, Bld: 85 mg/dL (ref 70–99)
Potassium: 4.3 mmol/L (ref 3.5–5.1)
Sodium: 134 mmol/L — ABNORMAL LOW (ref 135–145)

## 2023-08-17 LAB — CBC
HCT: 34.5 % — ABNORMAL LOW (ref 36.0–46.0)
Hemoglobin: 11 g/dL — ABNORMAL LOW (ref 12.0–15.0)
MCH: 29.1 pg (ref 26.0–34.0)
MCHC: 31.9 g/dL (ref 30.0–36.0)
MCV: 91.3 fL (ref 80.0–100.0)
Platelets: 299 10*3/uL (ref 150–400)
RBC: 3.78 MIL/uL — ABNORMAL LOW (ref 3.87–5.11)
RDW: 13.7 % (ref 11.5–15.5)
WBC: 8.2 10*3/uL (ref 4.0–10.5)
nRBC: 0 % (ref 0.0–0.2)

## 2023-08-17 NOTE — Progress Notes (Signed)
 Progress Note   Patient: Dawn Roy QMV:[id number] DOB: [birth year] DOA: 08/08/2023  DOS: the patient was seen and examined on 08/17/2023   Brief hospital course:  89 y.o. female with medical history significant for breast cancer status post bilateral mastectomy followed by gel implant in 1991, permanent atrial fibrillation on Eliquis , chronic anxiety/depression, hypertension, history of cellulitis involving bilateral breast silicone gel implants with admission from 04/07/2023 through 04/16/2023, history of picking at her breasts skin, who presents today with complaints of generalized weakness and right breast redness, swelling, warmth, tenderness.  Her symptoms are worsening for the past few days.  The patient admits to picking at her breasts even in her sleep despite wearing gloves.    6/4: Hemodynamically stable.  MRI chest with no concern of abscess or obvious cellulitis.  Concern of some implant leak.  6/9: Hemodynamically stable.  Patient is high risk for implant removal and plastic surgery does not think that current cellulitis is related to that leg.  It was most likely due to picking on skin. Plastic surgery to discuss with family before final recommendations.  6/10: Remained hemodynamically stable.  No plan for any surgery at this time.  Insurance authorization was started for SNF, likely can go tomorrow.   Assessment and Plan:   Sepsis - Tachycardia, tachypnea, elevated lactic acidosis with right breast cellulitis give concern for sepsis on presentation.  Given aggressive IV fluid hydration, blood cultures, empiric antibiotics.  Lactate resolved.  Monitor blood pressure closely.   Right breast cellulitis - History of recurrent cellulitis with previous hospitalizations.  Reportedly patient admits to picking/scratching at her breasts even in her sleep.  Attempts to wear gloves/mitts have not resolved issue.  Recent admission for same, previous involving bilateral breast with concern  for implant infection.  Follows with plastic surgery in the outpatient setting.  MRI pending to evaluate for abscess or peri-implant infection.  If present, would need evaluation by plastic surgeon.  Infectious disease consulted and following closely.  MRI chest with no concern of abscess or evident cellulitis but there was a concern of implant leak but this MRI was not very sensitive for that reason. - Completed a course of antibiotic - Plastic surgery does not think that the cellulitis is related to implant leak and removal of plant will be high risk for her   urinary tract infection Urine cultures with E. coli, shows resistance only to ampicillin, Augmentin, Cipro  and Bactrim . - Completed a course of antibiotic  Chronic right knee pain - Per report from family, patient has had right knee pain for some time, but apparently recently is worsening, sharp in nature.  Requesting Ortho evaluation.   Imaging with severe osteoarthritis-need outpatient evaluation   Persistent atrial fibrillation - Resume metoprolol  XL and Eliquis .     Chronic HFpEF - Does not not appear to be in acute exacerbation.  Euvolemic.  No hypoxia.   Chronic anxiety/depression - Resume home Celexa .   Physical debilitation muscle weakness - PT/OT recommending SNF  Subjective: Patient with no new concern.  Daughter at bedside  Physical Exam:  Vitals:   08/16/23 2000 08/17/23 0341 08/17/23 0500 08/17/23 0730  BP: 122/61 (!) 146/73  125/79  Pulse: 94 99  88  Resp: 18 19  19   Temp: 98.1 F (36.7 C) 97.9 F (36.6 C)  97.6 F (36.4 C)  TempSrc:      SpO2: 96% 93%  97%  Weight:   84 kg   Height:  General.  Frail elderly lady, in no acute distress. Pulmonary.  Lungs clear bilaterally, normal respiratory effort. CV.  Regular rate and rhythm, no JVD, rub or murmur. Abdomen.  Soft, nontender, nondistended, BS positive. CNS.  Alert and oriented .  No focal neurologic deficit. Extremities.  No edema, no  cyanosis, pulses intact and symmetrical.   Data Reviewed: Prior data reviewed  Labs: CBC: Recent Labs  Lab 08/11/23 0602 08/14/23 0315 08/15/23 0816 08/16/23 0437 08/17/23 0423  WBC 7.3 6.7 7.0 6.4 8.2  HGB 10.4* 10.9* 10.6* 10.9* 11.0*  HCT 32.9* 33.9* 33.3* 34.2* 34.5*  MCV 92.7 90.6 91.0 91.9 91.3  PLT 269 275 290 289 299   Basic Metabolic Panel: Recent Labs  Lab 08/14/23 0315 08/14/23 1028 08/15/23 0816 08/16/23 0437 08/17/23 0423  NA 131* 135 134* 135 134*  K 4.3 4.1 4.4 4.6 4.3  CL 97* 97* 99 100 100  CO2 29 28 29 27 27   GLUCOSE 96 130* 87 85 85  BUN 20 20 21  27* 25*  CREATININE 0.91 1.00 1.09* 1.24* 1.00  CALCIUM 8.4* 9.1 8.8* 8.8* 8.5*   Liver Function Tests: No results for input(s): "AST", "ALT", "ALKPHOS", "BILITOT", "PROT", "ALBUMIN" in the last 168 hours.  CBG: No results for input(s): "GLUCAP" in the last 168 hours.  Scheduled Meds:  acidophilus  1 capsule Oral Daily   apixaban   5 mg Oral BID   citalopram   20 mg Oral Daily   diclofenac  Sodium  4 g Topical QID   hydrocortisone    Topical BID   metoprolol  succinate  12.5 mg Oral Daily   silver  sulfADIAZINE    Topical Daily   Continuous Infusions:   PRN Meds:.acetaminophen , melatonin, metoprolol  tartrate, ondansetron  (ZOFRAN ) IV, oxyCODONE , polyethylene glycol  Family Communication: Discussed with daughter at bedside  Disposition: Status is: Inpatient Remains inpatient appropriate because: Awaiting insurance authorization for SNF  DVT prophylaxis.  Eliquis  Time spent: 40 minutes  Length of inpatient stay: 9 days  Author: Luna Salinas, MD 08/17/2023 2:10 PM  For on call review [email or web address].

## 2023-08-17 NOTE — Progress Notes (Signed)
 Mobility Specialist: Progress Note   08/17/23 1538  Mobility  Activity Ambulated with assistance in hallway  Level of Assistance Minimal assist, patient does 75% or more  Assistive Device Front wheel walker  Activity Response Tolerated well  Mobility Referral Yes  Mobility visit 1 Mobility  Mobility Specialist Start Time (ACUTE ONLY) [id number]  Mobility Specialist Stop Time (ACUTE ONLY) 0953  Mobility Specialist Time Calculation (min) (ACUTE ONLY) 10 min    Pt received in bed, pleasant and agreeable to mobility session. MinA for bed mobility to assist with trunk elevation. MinA for STS with min cues needed to fully extend knees and lean forward. CG for stand pivot to chair. No complaints. Left in chair with all needs met, call bell in reach. Chair alarm on.   Deloria Fetch Mobility Specialist Please contact via SecureChat or Rehab office at [phone]

## 2023-08-17 NOTE — Plan of Care (Signed)

## 2023-08-17 NOTE — Progress Notes (Signed)
 Physical Therapy Treatment Patient Details Name: Dawn Roy MRN: [id number] DOB: [birth year] Today's Date: 08/17/2023   History of Present Illness 89 y.o. female adm 08/08/23 with weakness, breast cellulitis and tachycardia. PMhx: breast CA s/p bil mastectomy with reconstruction, depression, HTN, vertigo, Afib, anxiety.    PT Comments  Pt received in supine, agreeable to therapy session with encouragement, with slightly improved tolerance for transfer training this date compared with previous session. Pt limited due to c/o fatigue after getting up to chair earlier in the day, but able to perform sit<>stand from EOB and lateral steps toward [facility] with RW support and up to modA lift/steadying assist from elevated surface. Pt also participated in seated BLE exercises for strength and balance challenge while seated. Patient will benefit from continued inpatient follow up therapy, <3 hours/day, she would need max HH services and +1-2 lift assist at all times in order to go home with family support at current level of functioning.   If plan is discharge home, recommend the following: Assistance with cooking/housework;Assist for transportation;Direct supervision/assist for medications management;Direct supervision/assist for financial management;Supervision due to cognitive status;Two people to help with walking and/or transfers;A lot of help with bathing/dressing/bathroom;Help with stairs or ramp for entrance   Can travel by private vehicle     No  Equipment Recommendations  None recommended by PT;Other (comment) (currently would need hoyer lift and ramp for home, pending progress)    Recommendations for Other Services       Precautions / Restrictions Precautions Precautions: Fall;Other (comment) Recall of Precautions/Restrictions: Impaired Precaution/Restrictions Comments: incontinent, many skin tears on her hands VERY sensitive, esp on dorsal surface Restrictions Weight Bearing Restrictions  Per Provider Order: No     Mobility  Bed Mobility Overal bed mobility: Needs Assistance Bed Mobility: Rolling, Sidelying to Sit, Sit to Sidelying Rolling: Contact guard assist, Used rails Sidelying to sit: HOB elevated, Used rails, Min assist   Sit to supine: Mod assist, Used rails   General bed mobility comments: increased time/effort to perform, pt c/o pain in both hands, R>L when performing and using rails.    Transfers Overall transfer level: Needs assistance Equipment used: Rolling walker (2 wheels) Transfers: Sit to/from Stand, Bed to chair/wheelchair/BSC Sit to Stand: Mod assist, From elevated surface   Step pivot transfers: Min assist, From elevated surface       General transfer comment: Greatly increased time/effort to initiate and perform, delay between lifting hips off bed and extending knees/hips. ~56ft lateral steps toward [facility] prior to sitting due to c/o fatigue. Pt unable to reach back prior to sitting despite cues.    Ambulation/Gait               General Gait Details: pt unable today   Stairs             Wheelchair Mobility     Tilt Bed    Modified Rankin (Stroke Patients Only)       Balance Overall balance assessment: Needs assistance Sitting-balance support: Feet supported, Bilateral upper extremity supported, Single extremity supported Sitting balance-Leahy Scale: Poor Sitting balance - Comments: Fair static sitting, poor dynamic sitting when unsupported   Standing balance support: Bilateral upper extremity supported, During functional activity, Reliant on assistive device for balance Standing balance-Leahy Scale: Poor Standing balance comment: Heavy reliance on RW and needs external assist                            Communication  Communication Communication: Impaired Factors Affecting Communication: Hearing impaired  Cognition Arousal: Alert Behavior During Therapy: Anxious   PT - Cognitive impairments: History  of cognitive impairments, Safety/Judgement, Sequencing                       PT - Cognition Comments: Pt remains anxious but slightly improved command following this date, pt does better with fewer cues and prefers to pull up on RW with staff stabilizing RW as daughter does at home. Following commands: Impaired Following commands impaired: Follows one step commands with increased time    Cueing Cueing Techniques: Verbal cues, Tactile cues, Gestural cues (pain in hands with attempts at [facility] or tactile cues, be careful)  Exercises Other Exercises Other Exercises: seated BLE AROM: hip flexion, LAQ x10 reps ea    General Comments General comments (skin integrity, edema, etc.): no acute s/sx distress other than c/o discomfort in BLE hands and guarding at bil hands when self-supporting at EOB and using RW handles, RN/MD notified      Pertinent Vitals/Pain Pain Assessment Pain Assessment: Faces Faces Pain Scale: Hurts even more Pain Location: hands/wrists, feet/legs, most tender to palpation on hands/wrists Pain Descriptors / Indicators: Aching, Sore, Discomfort, Grimacing, Guarding Pain Intervention(s): Limited activity within patient's tolerance, Monitored during session, Repositioned    Home Living                          Prior Function            PT Goals (current goals can now be found in the care plan section) Acute Rehab PT Goals Patient Stated Goal: return home PT Goal Formulation: With patient/family Time For Goal Achievement: 08/23/23 Progress towards PT goals: Progressing toward goals    Frequency    Min 1X/week      PT Plan      Co-evaluation              AM-PAC PT "6 Clicks" Mobility   Outcome Measure  Help needed turning from your back to your side while in a flat bed without using bedrails?: A Little Help needed moving from lying on your back to sitting on the side of a flat bed without using bedrails?: A Lot Help needed moving  to and from a bed to a chair (including a wheelchair)?: A Lot Help needed standing up from a chair using your arms (e.g., wheelchair or bedside chair)?: A Lot Help needed to walk in hospital room?: Total Help needed climbing 3-5 steps with a railing? : Total 6 Click Score: 11    End of Session Equipment Utilized During Treatment: Gait belt;Other (comment) (breast binder remains donned throughout) Activity Tolerance: Patient tolerated treatment well;Patient limited by fatigue Patient left: in bed;with call bell/phone within reach;with bed alarm set;Other (comment) (semi-sidelying to her L with pillows to offload lower back and between her knees) Nurse Communication: Mobility status PT Visit Diagnosis: Other abnormalities of gait and mobility (R26.89);Difficulty in walking, not elsewhere classified (R26.2)     Time: 1610-9604 PT Time Calculation (min) (ACUTE ONLY): 20 min  Charges:    $Therapeutic Activity: 8-22 mins PT General Charges $$ ACUTE PT VISIT: 1 Visit                     Elija Mccamish P., PTA Acute Rehabilitation Services Secure Chat Preferred 9a-5:30pm Office: [phone]    Mariel Shope [facility] 08/17/2023, 4:23 PM

## 2023-08-18 DIAGNOSIS — N61 Mastitis without abscess: Secondary | ICD-10-CM | POA: Diagnosis not present

## 2023-08-18 LAB — CBC
HCT: 35.4 % — ABNORMAL LOW (ref 36.0–46.0)
Hemoglobin: 11.1 g/dL — ABNORMAL LOW (ref 12.0–15.0)
MCH: 28.8 pg (ref 26.0–34.0)
MCHC: 31.4 g/dL (ref 30.0–36.0)
MCV: 91.9 fL (ref 80.0–100.0)
Platelets: 293 10*3/uL (ref 150–400)
RBC: 3.85 MIL/uL — ABNORMAL LOW (ref 3.87–5.11)
RDW: 13.6 % (ref 11.5–15.5)
WBC: 7.2 10*3/uL (ref 4.0–10.5)
nRBC: 0 % (ref 0.0–0.2)

## 2023-08-18 LAB — BASIC METABOLIC PANEL WITH GFR
Anion gap: 6 (ref 5–15)
BUN: 21 mg/dL (ref 8–23)
CO2: 28 mmol/L (ref 22–32)
Calcium: 8.8 mg/dL — ABNORMAL LOW (ref 8.9–10.3)
Chloride: 101 mmol/L (ref 98–111)
Creatinine, Ser: 0.92 mg/dL (ref 0.44–1.00)
GFR, Estimated: 58 mL/min — ABNORMAL LOW (ref >60)
Glucose, Bld: 90 mg/dL (ref 70–99)
Potassium: 4 mmol/L (ref 3.5–5.1)
Sodium: 135 mmol/L (ref 135–145)

## 2023-08-18 MED ORDER — POLYETHYLENE GLYCOL 3350 17 G PO PACK: 17.0000 g | PACK | Freq: Every day | ORAL | Status: DC | PRN

## 2023-08-18 MED ORDER — METOPROLOL SUCCINATE ER 25 MG PO TB24: 12.5000 mg | ORAL_TABLET | Freq: Every day | ORAL | Status: DC

## 2023-08-18 MED ORDER — SILVER SULFADIAZINE 1 % EX CREA: TOPICAL_CREAM | Freq: Every day | CUTANEOUS | Status: AC

## 2023-08-18 MED ORDER — DICLOFENAC SODIUM 1 % EX GEL: 4.0000 g | Freq: Four times a day (QID) | CUTANEOUS | Status: AC

## 2023-08-18 MED ORDER — HYDROCORTISONE 1 % EX OINT: TOPICAL_OINTMENT | Freq: Two times a day (BID) | CUTANEOUS | Status: DC

## 2023-08-18 NOTE — Discharge Summary (Signed)
 Physician Discharge Summary   Patient: Dawn Roy MRN: [id number] DOB: 06/11/1929  Admit date:     08/08/2023  Discharge date: 08/18/23  Discharge Physician: Cherylle Corwin   PCP: Arva Lathe, MD   Recommendations at discharge:    Follow up with PCP in 1-2 weeks Follow up with Dr. Larraine Plush as needed Follow up with Dr. Christiane Cowing (Orthopedic Surgery) as will be scheduled  Discharge Diagnoses: Principal Problem:   Cellulitis Active Problems:   Persistent atrial fibrillation (HCC)   Depression   Sepsis (HCC)   Right knee pain   Chronic heart failure with preserved ejection fraction (HFpEF) (HCC)   Cellulitis of right breast   Dermatitis   Hx of bilateral breast implants   Normocytic anemia   Hyponatremia  Resolved Problems:   * No resolved hospital problems. *  Hospital Course: 89 y.o. female with medical history significant for breast cancer status post bilateral mastectomy followed by gel implant in 1991, permanent atrial fibrillation on Eliquis , chronic anxiety/depression, hypertension, history of cellulitis involving bilateral breast silicone gel implants with admission from 04/07/2023 through 04/16/2023, history of picking at her breasts skin, who presents today with complaints of generalized weakness and right breast redness, swelling, warmth, tenderness.  Her symptoms are worsening for the past few days.  The patient admits to picking at her breasts even in her sleep despite wearing gloves.    Assessment and Plan: Sepsis secondary to cellulitis and UTI - Tachycardia, tachypnea, elevated lactic acidosis with right breast cellulitis give concern for sepsis on presentation.  Given aggressive IV fluid hydration, blood cultures, empiric antibiotics.  Lactate resolved.  Monitor blood pressure closely.   Right breast cellulitis - History of recurrent cellulitis with previous hospitalizations.  Reportedly patient admits to picking/scratching at her breasts even in her  sleep.  Attempts to wear gloves/mitts have not resolved issue.  Recent admission for same, previous involving bilateral breast with concern for implant infection.  Follows with plastic surgery in the outpatient setting.  MRI chest with no concern of abscess or evident cellulitis but there was a concern of implant leak but this MRI was not very sensitive for that reason. - Completed a course of antibiotic - Plastic surgery does not think that the cellulitis is related to implant leak and removal of plant will be high risk for her. F/u with Plastic Surgery as needed    urinary tract infection Urine cultures with E. coli, shows resistance only to ampicillin, Augmentin, Cipro  and Bactrim . - Completed a course of antibiotic   Chronic right knee pain - Per report from family, patient has had right knee pain for some time, but apparently recently is worsening, sharp in nature -Imaging with severe osteoarthritis-need outpatient evaluation   Persistent atrial fibrillation - Continued metoprolol  XL and Eliquis .     Chronic HFpEF - Does not not appear to be in acute exacerbation.  Euvolemic.  No hypoxia.   Chronic anxiety/depression - Resume home Celexa .   Physical debilitation muscle weakness - PT/OT recommending SNF       Consultants: ID, Plastic Surgery Procedures performed:   Disposition: Skilled nursing facility Diet recommendation:  Cardiac diet DISCHARGE MEDICATION: Allergies as of 08/18/2023       Reactions   Azithromycin Hives   Dalmane [flurazepam] Other (See Comments)   unknown   Demerol [meperidine] Other (See Comments)   Unknown   Felodipine Other (See Comments)   unknown   Fosamax [alendronate Sodium] Other (See Comments)   unknown   Hctz [  hydrochlorothiazide] Other (See Comments)   Unknown   Ivp Dye [iodinated Contrast Media] Other (See Comments)   unknown   Keflin [cephalothin] Other (See Comments)   unknown   Macrodantin [nitrofurantoin Macrocrystal] Other  (See Comments)   unknown   Morphine And Codeine Other (See Comments)   unknown   Phenergan [promethazine Hcl] Other (See Comments)   unknown   Valium [diazepam] Other (See Comments)   unknown   Doxycycline  Rash   headache   Keflex [cephalexin] Rash   Neosporin [neomycin-bacitracin Zn-polymyx] Rash   Penicillins Rash        Medication List     STOP taking these medications    furosemide  20 MG tablet Commonly known as: LASIX    gabapentin  100 MG capsule Commonly known as: NEURONTIN        TAKE these medications    apixaban  5 MG Tabs tablet Commonly known as: ELIQUIS  Take 1 tablet (5 mg total) by mouth 2 (two) times daily.   Cholecalciferol 50 MCG (2000 UT) Caps Take 2,000 Units by mouth daily after supper.   citalopram  40 MG tablet Commonly known as: CELEXA  Take 20 mg by mouth daily after supper.   diclofenac  Sodium 1 % Gel Commonly known as: VOLTAREN  Apply 4 g topically 4 (four) times daily. Apply to right knee   hydrocortisone  1 % ointment Apply topically 2 (two) times daily. Apply to itchy skin   loratadine  10 MG tablet Commonly known as: CLARITIN  Take 10 mg by mouth daily after supper.   metoprolol  succinate 25 MG 24 hr tablet Commonly known as: TOPROL -XL Take 0.5 tablets (12.5 mg total) by mouth daily. Start taking on: August 19, 2023 What changed:  medication strength how much to take additional instructions   polyethylene glycol 17 g packet Commonly known as: MIRALAX  / GLYCOLAX  Take 17 g by mouth daily as needed for mild constipation.   PROBIOTIC DAILY PO Take 1 capsule by mouth daily after supper. Culturelle   silver  sulfADIAZINE  1 % cream Commonly known as: SILVADENE  Apply topically daily. Apply to breast wound Start taking on: August 19, 2023 What changed:  how much to take additional instructions        Contact information for follow-up providers     [facility] Follow up.   Why: [facility] will provide home health  services.  They will call you in the next 24-48 hours to start services. Contact information: [address] [place] [address] [phone]         Teretha Ferguson, MD. Call.   Specialty: Plastic Surgery Why: As needed Contact information: [address] [place] [address] [phone]         Wes Hamman, MD. Schedule an appointment as soon as possible for a visit.   Specialty: Orthopedic Surgery Contact information: [address] [place]  [place] [address] [phone]              Contact information for after-discharge care     Destination     [facility] .   Service: Skilled Nursing Contact information: [address] [place]  [address] [phone]                    Discharge Exam: Cleavon Curls Weights   08/16/23 0431 08/17/23 0500 08/18/23 0500  Weight: 83.7 kg 84 kg 84 kg   General exam: Awake, laying in bed, in nad Respiratory system: Normal respiratory effort, no wheezing Cardiovascular system: regular rate, s1, s2 Gastrointestinal system: Soft,  nondistended, positive BS Central nervous system: CN2-12 grossly intact, strength intact Extremities: Perfused, no clubbing Skin: Normal skin turgor, no notable skin lesions seen Psychiatry: Mood normal // no visual hallucinations   Condition at discharge: fair  The results of significant diagnostics from this hospitalization (including imaging, microbiology, ancillary and laboratory) are listed below for reference.   Imaging Studies: MR CHEST W WO CONTRAST Result Date: 08/11/2023 CLINICAL DATA:  89 year old female with a history of bilateral mastectomies and implant reconstructions in 1991. Patient presents with clinical findings of cellulitis involving both breasts, previous hospital admission 04/07/2023 through 04/16/2023. EXAM: MR CHEST WITH AND WITHOUT CONTRAST TECHNIQUE: Multiplanar, multisequence MR imaging of the chest was performed before and  after the administration of intravenous contrast. Standard chest/body coil which lies. CONTRAST:  8mL GADAVIST  GADOBUTROL  1 MMOL/ML IV SOLN COMPARISON:  CT chest, 04/07/2023 and 12/10/2022. FINDINGS: Right breast: There is heterogeneous signal from the right implant. More centrally, implant has signal characteristics consistent with silicone. Around the periphery there is more heterogeneous signal with areas of increased T1 and decreased T2 signal as well as increased T1 and T2 signal and some decreased T1 and bright T2 signal. This is contained within the fibrous capsule, consistent with complex intracapsular rupture. Some of the areas of heterogeneous signal suggest previous hemorrhage between the implant shell and fibrous capsule. Medial to the fibrous capsule there is increased T2 signal without associated enhancement, consistent with edema. There are no breast masses. There are no areas of abnormal extracapsular breast enhancement to suggest malignancy. Left breast: Left breast implant also shows heterogeneous signal. This all appears intracapsular consistent with a complex intracapsular rupture. There is a combination of silicone signal and fluid. There is enhancement along the posterior aspect the capsule that is nonspecific, but grossly intracapsular and not suspicious for malignancy. There are no breast masses. There are no areas of abnormal enhancement to suggest breast malignancy. Mild T2 signal hyperintensity is noted along the lower lateral aspect the implant capsule consistent with edema. Remaining chest soft tissues: Additional areas of increased T2 signal are noted along the posteroinferior aspect of the chest wall consistent with edema. There is no associated enhancement to suggest cellulitis. Heterogeneous posterior left thyroid nodule, stable from the 04/07/2023 CT scan. There are no fluid collections to suggest an abscess. Musculoskeletal: No fracture or bone lesion.  No abnormal bone marrow  signal. IMPRESSION: 1. No convincing chest wall cellulitis.  No evidence of an abscess. 2. Areas of subcutaneous soft tissue edema, most evident along the medial aspect the right breast implant and posterolateral aspects of the chest wall. 3. Bilateral abnormal breast implants grossly consistent with extensive intracapsular rupture. Current MRI protocol was not designed for assessment of silicone implant integrity, with no silicone specific sequences acquired. For this reason, subtle extracapsular rupture or silicone bleed is not excluded. There is enhancement along a portion of the posterior upper left breast implant, posterior to the implant shell, associated with an oval area of heterogeneous signal measuring 3 cm in size. This may reflect a focus of prior hemorrhage but is nonspecific. Electronically Signed   By: Amanda Jungling M.D.   On: 08/11/2023 16:24   DG Knee Complete 4 Views Right Result Date: 08/10/2023 CLINICAL DATA:  Right knee pain and swelling.  No known injury. EXAM: RIGHT KNEE - COMPLETE 4+ VIEW COMPARISON:  Right tibia and fibula radiographs 12/04/2015 FINDINGS: There is diffuse decreased bone mineralization. Severe medial compartment joint space narrowing and bone-on-bone contact, subchondral sclerosis, and moderate  to high-grade peripheral osteophytosis is unchanged from prior. Minimal lateralization of the proximal tibia with respect to the distal femur is similar to prior. The lateral compartment joint space is maintained but there are prominent osteophytes at the medial aspect of the lateral compartment. Severe patellofemoral joint space narrowing with moderate peripheral osteophytosis. No joint effusion. Large posterosuperior femoral condyle degenerative osteophytes are increased from 2017. No acute fracture is seen. No dislocation. IMPRESSION: Severe medial and patellofemoral compartment osteoarthritis, similar to 2017. Electronically Signed   By: Bertina Broccoli M.D.   On: 08/10/2023 17:04    DG Chest 2 View Result Date: 08/08/2023 CLINICAL DATA:  Generalized weakness for 4 days.  Dizziness. EXAM: CHEST - 2 VIEW COMPARISON:  Chest radiograph 12/09/2022.  CT chest 04/07/2023 FINDINGS: Shallow inspiration. Mild cardiac enlargement. No vascular congestion. Peribronchial thickening and bronchiectasis consistent with chronic bronchitis. Mild increased density over the right lung base is likely due to soft tissue attenuation. No definite airspace disease or consolidation in the lungs. No pleural effusion or pneumothorax. Mediastinal contours appear intact. Degenerative changes in the spine and shoulders. IMPRESSION: Mild cardiac enlargement. Chronic bronchitic changes in the lungs. No focal consolidation. Electronically Signed   By: Boyce Byes M.D.   On: 08/08/2023 19:51    Microbiology: Results for orders placed or performed during the hospital encounter of 08/08/23  Urine Culture     Status: Abnormal   Collection Time: 08/08/23  8:34 PM   Specimen: Urine, Random  Result Value Ref Range Status   Specimen Description URINE, RANDOM  Final   Special Requests   Final    NONE Reflexed from [id number] Performed at [facility], [address], [place], [place] [address]    Culture >=100,000 COLONIES/mL ESCHERICHIA COLI (A)  Final   Report Status 08/10/2023 FINAL  Final   Organism ID, Bacteria ESCHERICHIA COLI (A)  Final      Susceptibility   Escherichia coli - MIC*    AMPICILLIN >=32 RESISTANT Resistant     CEFAZOLIN <=4 SENSITIVE Sensitive     CEFEPIME  <=0.12 SENSITIVE Sensitive     CEFTRIAXONE  <=0.25 SENSITIVE Sensitive     CIPROFLOXACIN  >=4 RESISTANT Resistant     GENTAMICIN <=1 SENSITIVE Sensitive     IMIPENEM <=0.25 SENSITIVE Sensitive     NITROFURANTOIN <=16 SENSITIVE Sensitive     TRIMETH /SULFA  >=320 RESISTANT Resistant     AMPICILLIN/SULBACTAM 16 INTERMEDIATE Intermediate     PIP/TAZO <=4 SENSITIVE Sensitive ug/mL    * >=100,000 COLONIES/mL ESCHERICHIA COLI     Labs: CBC: Recent Labs  Lab 08/14/23 0315 08/15/23 0816 08/16/23 0437 08/17/23 0423 08/18/23 0416  WBC 6.7 7.0 6.4 8.2 7.2  HGB 10.9* 10.6* 10.9* 11.0* 11.1*  HCT 33.9* 33.3* 34.2* 34.5* 35.4*  MCV 90.6 91.0 91.9 91.3 91.9  PLT 275 290 289 299 293   Basic Metabolic Panel: Recent Labs  Lab 08/14/23 1028 08/15/23 0816 08/16/23 0437 08/17/23 0423 08/18/23 0416  NA 135 134* 135 134* 135  K 4.1 4.4 4.6 4.3 4.0  CL 97* 99 100 100 101  CO2 28 29 27 27 28   GLUCOSE 130* 87 85 85 90  BUN 20 21 27* 25* 21  CREATININE 1.00 1.09* 1.24* 1.00 0.92  CALCIUM 9.1 8.8* 8.8* 8.5* 8.8*   Liver Function Tests: No results for input(s): AST, ALT, ALKPHOS, BILITOT, PROT, ALBUMIN in the last 168 hours. CBG: No results for input(s): GLUCAP in the last 168 hours.  Discharge time spent: less than 30 minutes.  Signed: Cherylle Corwin, MD [facility] 08/18/2023

## 2023-08-18 NOTE — TOC Transition Note (Signed)
 Transition of Care [facility]) - Discharge Note   Patient Details  Name: Dawn Roy MRN: [id number] Date of Birth: 1929-03-11  Transition of [facility]) CM/SW Contact:  Hadar Elgersma A [place], LCSW Phone Number: 08/18/2023, 3:57 PM   Clinical Narrative:     Patient will DC to: [place]  Anticipated DC date: 08/18/23  Family notified: Alba Huddle  Transport by: Lyna Sandhoff      Per MD patient ready for DC to [facility]. RN, patient, patient's family, and facility notified of DC. Discharge Summary and FL2 sent to facility. RN to call report prior to discharge [phone]). DC packet on chart. Ambulance transport requested for patient.     CSW will sign off for now as social work intervention is no longer needed. Please consult us  again if new needs arise.   Final next level of care: Skilled Nursing Facility Barriers to Discharge: Barriers Resolved   Patient Goals and CMS Choice            Discharge Placement              Patient chooses bed at: [facility] Patient to be transferred to facility by: [facility] Name of family member notified: Nanine Babcock Patient and family notified of of transfer: 08/18/23  Discharge Plan and Services Additional resources added to the After Visit Summary for                                       Social Drivers of Health (SDOH) Interventions SDOH Screenings   Food Insecurity: Unknown (08/09/2023)  Housing: Low Risk  (08/09/2023)  Transportation Needs: No Transportation Needs (08/09/2023)  Utilities: Not At Risk (08/09/2023)  Social Connections: Socially Isolated (08/09/2023)  Tobacco Use: Low Risk  (05/06/2023)     Readmission Risk Interventions    08/10/2023   11:31 AM  Readmission Risk Prevention Plan  Transportation Screening Complete  PCP or Specialist Appt within 5-7 Days Complete  Home Care Screening Complete  Medication Review (RN CM) Complete

## 2023-08-18 NOTE — Plan of Care (Signed)

## 2023-08-18 NOTE — Progress Notes (Signed)
 Attempted to call report to [facility] with number provided. [phone]. Unable to leave message.

## 2023-08-18 NOTE — Progress Notes (Signed)
 Occupational Therapy Treatment Patient Details Name: Dawn Roy MRN: [id number] DOB: 11-04-1929 Today's Date: 08/18/2023   History of present illness 89 y.o. female adm 08/08/23 with weakness, breast cellulitis and tachycardia. PMhx: breast CA s/p bil mastectomy with reconstruction, depression, HTN, vertigo, Afib, anxiety.   OT comments  Pt presented in bed and very happy to work with therapy. She was able to complete bed mobility with mod assist, sit to stand transfers with increase in time and max cues for positioning with mod assist and then ambulation with CGA to min assist to direct walker with close chair follow for two intervals. Attempted in session with LB dressing but required total assist. Patient will benefit from continued inpatient follow up therapy, <3 hours/day.      If plan is discharge home, recommend the following:  A lot of help with walking and/or transfers;A lot of help with bathing/dressing/bathroom;Direct supervision/assist for medications management;Direct supervision/assist for financial management;Assist for transportation;Help with stairs or ramp for entrance   Equipment Recommendations  Other (comment) (TBD at next level of care)    Recommendations for Other Services      Precautions / Restrictions Precautions Precautions: Fall Recall of Precautions/Restrictions: Impaired Precaution/Restrictions Comments: incontinent, many skin tears on her hands VERY sensitive, esp on dorsal surface Restrictions Weight Bearing Restrictions Per Provider Order: No       Mobility Bed Mobility Overal bed mobility: Needs Assistance Bed Mobility: Supine to Sit Rolling: Min assist, Mod assist     Sit to supine: Mod assist   General bed mobility comments: was attempting to get to EOB but was unable to get hip closed to the EOB needing mod assist    Transfers Overall transfer level: Needs assistance Equipment used: Rolling walker (2 wheels) Transfers: Sit to/from  Stand Sit to Stand: Mod assist, From elevated surface           General transfer comment: Pt evene with cueing to attempt to push up from bed will only pull up onto walker as less fearful of standing.     Balance Overall balance assessment: Needs assistance Sitting-balance support: Feet supported, Bilateral upper extremity supported, Single extremity supported Sitting balance-Leahy Scale: Poor Sitting balance - Comments: posterior tilt with BUE support and when attempting to complete tasks requires min assist for trunk support Postural control: Posterior lean Standing balance support: Bilateral upper extremity supported, During functional activity, Reliant on assistive device for balance Standing balance-Leahy Scale: Poor Standing balance comment: Heavy reliance on RW and needs external assist                           ADL either performed or assessed with clinical judgement   ADL Overall ADL's : Needs assistance/impaired Eating/Feeding: Set up;Sitting   Grooming: Set up;Sitting   Upper Body Bathing: Contact guard assist;Sitting   Lower Body Bathing: Total assistance;Maximal assistance   Upper Body Dressing : Contact guard assist;Sitting   Lower Body Dressing: Maximal assistance;Total assistance;Sitting/lateral leans   Toilet Transfer: Minimal assistance;Moderate assistance;Cueing for safety;Cueing for sequencing   Toileting- Clothing Manipulation and Hygiene: Total assistance;Sit to/from stand       Functional mobility during ADLs: Minimal assistance;Contact guard assist;Rolling walker (2 wheels)      Extremity/Trunk Assessment Upper Extremity Assessment Upper Extremity Assessment: Generalized weakness   Lower Extremity Assessment Lower Extremity Assessment: Defer to PT evaluation        Vision       Perception     Praxis  Communication Communication Communication: Impaired Factors Affecting Communication: Hearing impaired   Cognition  Arousal: Alert Behavior During Therapy: Anxious Cognition: Cognition impaired   Orientation impairments: Time, Situation, Place (given options they were able to determin they were in hospital) Awareness: Intellectual awareness impaired, Online awareness impaired Memory impairment (select all impairments): Short-term memory, Working [employer] fast streamer, Conservation officer, historic buildings Attention impairment (select first level of impairment): Selective attention Executive functioning impairment (select all impairments): Reasoning, Problem solving OT - Cognition Comments: pt was more alert today and only had one moment of attempting to close eyes in session                 Following commands: Impaired Following commands impaired: Follows one step commands with increased time      Cueing   Cueing Techniques: Verbal cues  Exercises      Shoulder Instructions       General Comments      Pertinent Vitals/ Pain       Pain Assessment Pain Assessment: Faces Faces Pain Scale: Hurts a little bit Breathing: normal Negative Vocalization: none Facial Expression: smiling or inexpressive Body Language: relaxed Consolability: no need to console PAINAD Score: 0 Pain Location: general aching Pain Descriptors / Indicators: Aching Pain Intervention(s): Limited activity within patient's tolerance  Home Living                                          Prior Functioning/Environment              Frequency  Min 2X/week        Progress Toward Goals  OT Goals(current goals can now be found in the care plan section)  Progress towards OT goals: Progressing toward goals  Acute Rehab OT Goals OT Goal Formulation: With patient Time For Goal Achievement: 09/01/23 Potential to Achieve Goals: Good ADL Goals Pt Will Perform Grooming: with contact guard assist;standing Pt Will Perform Upper Body Dressing: with set-up;sitting Pt Will Transfer to Toilet: ambulating;bedside  commode;with supervision  Plan      Co-evaluation                 AM-PAC OT 6 Clicks Daily Activity     Outcome Measure   Help from another person eating meals?: A Little Help from another person taking care of personal grooming?: A Little Help from another person toileting, which includes using toliet, bedpan, or urinal?: Total Help from another person bathing (including washing, rinsing, drying)?: A Lot Help from another person to put on and taking off regular upper body clothing?: A Little Help from another person to put on and taking off regular lower body clothing?: Total 6 Click Score: 13    End of Session Equipment Utilized During Treatment: Gait belt;Rolling walker (2 wheels)  OT Visit Diagnosis: Unsteadiness on feet (R26.81);Muscle weakness (generalized) (M62.81)   Activity Tolerance Patient tolerated treatment well   Patient Left in chair;with call bell/phone within reach;with chair alarm set   Nurse Communication Mobility status (pt in chair)        Time: 4098-1191 OT Time Calculation (min): 34 min  Charges: OT General Charges $OT Visit: 1 Visit OT Treatments $Self Care/Home Management : 23-37 mins  Erving Heather OTR/L  Acute Rehab Services  [phone] office number   Stevphen Elders 08/18/2023, 10:28 AM

## 2023-08-18 NOTE — Hospital Course (Signed)
 89 y.o. female with medical history significant for breast cancer status post bilateral mastectomy followed by gel implant in 1991, permanent atrial fibrillation on Eliquis , chronic anxiety/depression, hypertension, history of cellulitis involving bilateral breast silicone gel implants with admission from 04/07/2023 through 04/16/2023, history of picking at her breasts skin, who presents today with complaints of generalized weakness and right breast redness, swelling, warmth, tenderness.  Her symptoms are worsening for the past few days.  The patient admits to picking at her breasts even in her sleep despite wearing gloves.

## 2023-09-21 ENCOUNTER — Encounter (HOSPITAL_BASED_OUTPATIENT_CLINIC_OR_DEPARTMENT_OTHER): Payer: Self-pay | Admitting: *Deleted

## 2023-09-21 ENCOUNTER — Other Ambulatory Visit: Payer: Self-pay

## 2023-09-21 ENCOUNTER — Emergency Department (HOSPITAL_BASED_OUTPATIENT_CLINIC_OR_DEPARTMENT_OTHER)
Admission: EM | Admit: 2023-09-21 | Discharge: 2023-09-21 | Disposition: A | Discharge status: Home / Self Care | Attending: Emergency Medicine | Admitting: Emergency Medicine

## 2023-09-21 DIAGNOSIS — Z7901 Long term (current) use of anticoagulants: Secondary | ICD-10-CM | POA: Diagnosis not present

## 2023-09-21 DIAGNOSIS — N61 Mastitis without abscess: Secondary | ICD-10-CM | POA: Insufficient documentation

## 2023-09-21 MED ORDER — DOXYCYCLINE HYCLATE 100 MG PO CAPS
100.0000 mg | ORAL_CAPSULE | Freq: Two times a day (BID) | ORAL | 0 refills | Status: DC
Start: 2023-09-21 — End: 2024-01-23

## 2023-09-21 NOTE — ED Provider Notes (Signed)
 [place] EMERGENCY DEPARTMENT AT [facility] Provider Note   CSN: [id number] Arrival date & time: 09/21/23  1154     Patient presents with: Cellulitis   Dawn Roy is a 89 y.o. female.   Patient brought to the emergency department for evaluation by family member.  Patient has a right breast wound.  Patient has had this for an extended period of time.  Family reports that patient picks at the area and that wound stays open.  Patient has been hospitalized multiple times in the past for cellulitis to her right breast.  Patient is allergic to multiple medications.  Family member reports that they have tried gloves they have tried wraps to keep patient from picking at the area but they cannot stop her.  Family is concerned that patient is developing an infection I would like to get her on oral antibiotics to prevent her having to be hospitalized.  Family reports that the wound is not as bad as it has been previously  The history is provided by the patient. No language interpreter was used.       Prior to Admission medications   Medication Sig Start Date End Date Taking? Authorizing Provider  doxycycline  (VIBRAMYCIN ) 100 MG capsule Take 1 capsule (100 mg total) by mouth 2 (two) times daily. 09/21/23  Yes Flint Sonny POUR, PA-C  apixaban  (ELIQUIS ) 5 MG TABS tablet Take 1 tablet (5 mg total) by mouth 2 (two) times daily. 12/28/22   Terra Fairy PARAS, PA-C  Cholecalciferol 50 MCG (2000 UT) CAPS Take 2,000 Units by mouth daily after supper.    [provider]  citalopram  (CELEXA ) 40 MG tablet Take 20 mg by mouth daily after supper.    [provider]  diclofenac  Sodium (VOLTAREN ) 1 % GEL Apply 4 g topically 4 (four) times daily. Apply to right knee 08/18/23   Cindy Garnette POUR, MD  hydrocortisone  1 % ointment Apply topically 2 (two) times daily. Apply to itchy skin 08/18/23   Cindy Garnette POUR, MD  loratadine  (CLARITIN ) 10 MG tablet Take 10 mg by mouth daily after  supper.    [provider]  metoprolol  succinate (TOPROL -XL) 25 MG 24 hr tablet Take 0.5 tablets (12.5 mg total) by mouth daily. 08/19/23 09/18/23  Cindy Garnette POUR, MD  polyethylene glycol (MIRALAX  / GLYCOLAX ) 17 g packet Take 17 g by mouth daily as needed for mild constipation. 08/18/23   Cindy Garnette POUR, MD  Probiotic Product (PROBIOTIC DAILY PO) Take 1 capsule by mouth daily after supper. Culturelle    [provider]  silver  sulfADIAZINE  (SILVADENE ) 1 % cream Apply topically daily. Apply to breast wound 08/19/23   Cindy Garnette POUR, MD    Allergies: Azithromycin, Dalmane [flurazepam], Demerol [meperidine], Felodipine, Fosamax [alendronate sodium], Hctz [hydrochlorothiazide], Ivp dye [iodinated contrast media], Keflin [cephalothin], Macrodantin [nitrofurantoin macrocrystal], Morphine and codeine, Phenergan [promethazine hcl], Valium [diazepam], Doxycycline , Keflex [cephalexin], Neosporin [neomycin-bacitracin zn-polymyx], and Penicillins    Review of Systems  Constitutional:  Negative for fever.  All other systems reviewed and are negative.   Updated Vital Signs BP 124/62   Pulse 80   Temp 98.1 F (36.7 C) (Oral)   Resp 17   SpO2 97%   Physical Exam Vitals and nursing note reviewed.  Constitutional:      Appearance: She is well-developed.  HENT:     Head: Normocephalic.  Cardiovascular:     Rate and Rhythm: Normal rate.  Pulmonary:     Effort: Pulmonary effort is normal.  Abdominal:     General: There is no distension.  Musculoskeletal:        General: Normal range of motion.     Cervical back: Normal range of motion.  Skin:    General: Skin is warm.     Comments: 2 quarter size red wounds right breast, no drainage,  Neurological:     General: No focal deficit present.     Mental Status: She is alert and oriented to person, place, and time.     (all labs ordered are listed, but only abnormal results are displayed) Labs Reviewed - No data to  display  EKG: None  Radiology: No results found.   Procedures   Medications Ordered in the ED - No data to display                                  Medical Decision Making Patient is here with family requesting antibiotic treatment for red wounds on right breast.  Patient has a history of picking at the skin on her breast and developing cellulitis  Amount and/or Complexity of Data Reviewed Independent Historian: caregiver    Details: Patient is here with her daughter who is supportive Discussion of management or test interpretation with external provider(s): Patient has multiple allergies.  I spoke with the pharmacist to review allergies in order to treat patient outpatient.  Patient has had a rash from doxycycline .  Patient has been on doxycycline  on the records since she was reported to be allergic to it.  Patient has no recall of what her allergic reaction to doxycycline  is  Risk Prescription drug management. Risk Details: Patient is given a prescription for doxycycline .   follow-up with primary care physician for recheck.  Return if any problems        Final diagnoses:  Cellulitis of right breast    ED Discharge Orders          Ordered    doxycycline  (VIBRAMYCIN ) 100 MG capsule  2 times daily        09/21/23 1513           An After Visit Summary was printed and given to the patient.     [name], PA-C 09/21/23 2330    Dasie Faden, MD 09/22/23 [phone]

## 2023-09-21 NOTE — ED Triage Notes (Signed)
 Pt to ED with two open sores on right breast. Patient has been in the hospital intermittently since October 2024 for IV antibiotics to prevent infection and abscesses to the right breast. No pain reported to the area. No fevers at home.

## 2023-09-21 NOTE — Discharge Instructions (Addendum)
See your Physician for recheck in 2 days  °

## 2024-01-22 ENCOUNTER — Emergency Department (HOSPITAL_COMMUNITY)

## 2024-01-22 ENCOUNTER — Other Ambulatory Visit: Payer: Self-pay

## 2024-01-22 ENCOUNTER — Inpatient Hospital Stay (HOSPITAL_COMMUNITY)
Admission: EM | Admit: 2024-01-22 | Discharge: 2024-01-28 | DRG: 600 | Disposition: A | Discharge status: Hospice | Attending: Internal Medicine | Admitting: Internal Medicine

## 2024-01-22 ENCOUNTER — Encounter (HOSPITAL_COMMUNITY): Payer: Self-pay

## 2024-01-22 DIAGNOSIS — Z7901 Long term (current) use of anticoagulants: Secondary | ICD-10-CM

## 2024-01-22 DIAGNOSIS — Z90710 Acquired absence of both cervix and uterus: Secondary | ICD-10-CM

## 2024-01-22 DIAGNOSIS — T8549XA Other mechanical complication of breast prosthesis and implant, initial encounter: Principal | ICD-10-CM | POA: Diagnosis present

## 2024-01-22 DIAGNOSIS — R197 Diarrhea, unspecified: Secondary | ICD-10-CM | POA: Diagnosis present

## 2024-01-22 DIAGNOSIS — N1831 Chronic kidney disease, stage 3a: Secondary | ICD-10-CM | POA: Diagnosis present

## 2024-01-22 DIAGNOSIS — W010XXA Fall on same level from slipping, tripping and stumbling without subsequent striking against object, initial encounter: Secondary | ICD-10-CM | POA: Diagnosis present

## 2024-01-22 DIAGNOSIS — Z881 Allergy status to other antibiotic agents status: Secondary | ICD-10-CM

## 2024-01-22 DIAGNOSIS — Z853 Personal history of malignant neoplasm of breast: Secondary | ICD-10-CM

## 2024-01-22 DIAGNOSIS — F418 Other specified anxiety disorders: Secondary | ICD-10-CM | POA: Diagnosis present

## 2024-01-22 DIAGNOSIS — N61 Mastitis without abscess: Principal | ICD-10-CM | POA: Diagnosis present

## 2024-01-22 DIAGNOSIS — Z85828 Personal history of other malignant neoplasm of skin: Secondary | ICD-10-CM

## 2024-01-22 DIAGNOSIS — F419 Anxiety disorder, unspecified: Secondary | ICD-10-CM | POA: Diagnosis present

## 2024-01-22 DIAGNOSIS — Y812 Prosthetic and other implants, materials and accessory general- and plastic-surgery devices associated with adverse incidents: Secondary | ICD-10-CM | POA: Diagnosis present

## 2024-01-22 DIAGNOSIS — Y92009 Unspecified place in unspecified non-institutional (private) residence as the place of occurrence of the external cause: Secondary | ICD-10-CM

## 2024-01-22 DIAGNOSIS — Z91041 Radiographic dye allergy status: Secondary | ICD-10-CM

## 2024-01-22 DIAGNOSIS — Z9013 Acquired absence of bilateral breasts and nipples: Secondary | ICD-10-CM

## 2024-01-22 DIAGNOSIS — R8271 Bacteriuria: Secondary | ICD-10-CM | POA: Diagnosis present

## 2024-01-22 DIAGNOSIS — N611 Abscess of the breast and nipple: Secondary | ICD-10-CM | POA: Diagnosis present

## 2024-01-22 DIAGNOSIS — Z888 Allergy status to other drugs, medicaments and biological substances status: Secondary | ICD-10-CM

## 2024-01-22 DIAGNOSIS — M810 Age-related osteoporosis without current pathological fracture: Secondary | ICD-10-CM | POA: Diagnosis present

## 2024-01-22 DIAGNOSIS — I4819 Other persistent atrial fibrillation: Secondary | ICD-10-CM | POA: Diagnosis present

## 2024-01-22 DIAGNOSIS — F32A Depression, unspecified: Secondary | ICD-10-CM | POA: Diagnosis present

## 2024-01-22 DIAGNOSIS — R531 Weakness: Secondary | ICD-10-CM | POA: Diagnosis present

## 2024-01-22 DIAGNOSIS — W19XXXA Unspecified fall, initial encounter: Secondary | ICD-10-CM

## 2024-01-22 DIAGNOSIS — S21001A Unspecified open wound of right breast, initial encounter: Secondary | ICD-10-CM | POA: Diagnosis present

## 2024-01-22 DIAGNOSIS — L0390 Cellulitis, unspecified: Secondary | ICD-10-CM | POA: Diagnosis present

## 2024-01-22 DIAGNOSIS — Z880 Allergy status to penicillin: Secondary | ICD-10-CM

## 2024-01-22 DIAGNOSIS — D631 Anemia in chronic kidney disease: Secondary | ICD-10-CM | POA: Diagnosis present

## 2024-01-22 DIAGNOSIS — Z66 Do not resuscitate: Secondary | ICD-10-CM | POA: Diagnosis present

## 2024-01-22 DIAGNOSIS — S21002A Unspecified open wound of left breast, initial encounter: Secondary | ICD-10-CM | POA: Diagnosis present

## 2024-01-22 DIAGNOSIS — I129 Hypertensive chronic kidney disease with stage 1 through stage 4 chronic kidney disease, or unspecified chronic kidney disease: Secondary | ICD-10-CM | POA: Diagnosis present

## 2024-01-22 DIAGNOSIS — M25552 Pain in left hip: Secondary | ICD-10-CM | POA: Diagnosis present

## 2024-01-22 DIAGNOSIS — I4821 Permanent atrial fibrillation: Secondary | ICD-10-CM | POA: Diagnosis present

## 2024-01-22 DIAGNOSIS — Z79899 Other long term (current) drug therapy: Secondary | ICD-10-CM

## 2024-01-22 LAB — CBC WITH DIFFERENTIAL/PLATELET
Abs Immature Granulocytes: 0.09 K/uL — ABNORMAL HIGH (ref 0.00–0.07)
Basophils Absolute: 0 K/uL (ref 0.0–0.1)
Basophils Relative: 0 %
Eosinophils Absolute: 0.1 K/uL (ref 0.0–0.5)
Eosinophils Relative: 1 %
HCT: 34.2 % — ABNORMAL LOW (ref 36.0–46.0)
Hemoglobin: 10.6 g/dL — ABNORMAL LOW (ref 12.0–15.0)
Immature Granulocytes: 1 %
Lymphocytes Relative: 9 %
Lymphs Abs: 1.4 K/uL (ref 0.7–4.0)
MCH: 29.3 pg (ref 26.0–34.0)
MCHC: 31 g/dL (ref 30.0–36.0)
MCV: 94.5 fL (ref 80.0–100.0)
Monocytes Absolute: 1.2 K/uL — ABNORMAL HIGH (ref 0.1–1.0)
Monocytes Relative: 8 %
Neutro Abs: 12.2 K/uL — ABNORMAL HIGH (ref 1.7–7.7)
Neutrophils Relative %: 81 %
Platelets: 332 K/uL (ref 150–400)
RBC: 3.62 MIL/uL — ABNORMAL LOW (ref 3.87–5.11)
RDW: 13.5 % (ref 11.5–15.5)
WBC: 15 K/uL — ABNORMAL HIGH (ref 4.0–10.5)
nRBC: 0 % (ref 0.0–0.2)

## 2024-01-22 LAB — COMPREHENSIVE METABOLIC PANEL WITH GFR
ALT: 11 U/L (ref 0–44)
AST: 21 U/L (ref 15–41)
Albumin: 2.6 g/dL — ABNORMAL LOW (ref 3.5–5.0)
Alkaline Phosphatase: 60 U/L (ref 38–126)
Anion gap: 9 (ref 5–15)
BUN: 16 mg/dL (ref 8–23)
CO2: 29 mmol/L (ref 22–32)
Calcium: 8.6 mg/dL — ABNORMAL LOW (ref 8.9–10.3)
Chloride: 97 mmol/L — ABNORMAL LOW (ref 98–111)
Creatinine, Ser: 1.27 mg/dL — ABNORMAL HIGH (ref 0.44–1.00)
GFR, Estimated: 40 mL/min — ABNORMAL LOW (ref >60)
Glucose, Bld: 108 mg/dL — ABNORMAL HIGH (ref 70–99)
Potassium: 3.9 mmol/L (ref 3.5–5.1)
Sodium: 135 mmol/L (ref 135–145)
Total Bilirubin: 0.8 mg/dL (ref 0.0–1.2)
Total Protein: 6 g/dL — ABNORMAL LOW (ref 6.5–8.1)

## 2024-01-22 LAB — URINALYSIS, W/ REFLEX TO CULTURE (INFECTION SUSPECTED)
Bilirubin Urine: NEGATIVE
Glucose, UA: NEGATIVE mg/dL
Hgb urine dipstick: NEGATIVE
Ketones, ur: NEGATIVE mg/dL
Nitrite: POSITIVE — AB
Protein, ur: 30 mg/dL — AB
Specific Gravity, Urine: 1.023 (ref 1.005–1.030)
pH: 5 (ref 5.0–8.0)

## 2024-01-22 LAB — I-STAT CG4 LACTIC ACID, ED: Lactic Acid, Venous: 1.5 mmol/L (ref 0.5–1.9)

## 2024-01-22 MED ORDER — LACTATED RINGERS IV BOLUS
500.0000 mL | Freq: Once | INTRAVENOUS | Status: AC
  Administered 2024-01-22: 500 mL via INTRAVENOUS

## 2024-01-22 MED ORDER — OXYCODONE-ACETAMINOPHEN 5-325 MG PO TABS
1.0000 | ORAL_TABLET | Freq: Once | ORAL | Status: AC
  Administered 2024-01-22: 1 via ORAL
  Filled 2024-01-22: qty 1

## 2024-01-22 MED ORDER — VANCOMYCIN HCL 1500 MG/300ML IV SOLN
1500.0000 mg | Freq: Once | INTRAVENOUS | Status: AC
  Administered 2024-01-23: 1500 mg via INTRAVENOUS
  Filled 2024-01-22: qty 300

## 2024-01-22 NOTE — Progress Notes (Signed)
 ED Pharmacy Antibiotic Sign Off An antibiotic consult was received from an ED provider for Vancomycin   per pharmacy dosing for cellulitis. A chart review was completed to assess appropriateness.   The following one time order(s) were placed:  Vancomycin  1500 mg IV  Further antibiotic and/or antibiotic pharmacy consults should be ordered by the admitting provider if indicated.   Dail [name], [facility]  Clinical Pharmacist 01/22/24 11:22 PM

## 2024-01-22 NOTE — ED Provider Notes (Signed)
 Care of patient received from prior provider at 11:22 PM, please see their note for complete H/P and care plan.  Received handoff per ED course.  Clinical Course as of 01/22/24 2322  Sat Jan 22, 2024  2306 Stable HO WP 75 YOF with GLF On Hospice. 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  3 weeks of antibiotics   [CC]    Clinical Course User Index [CC] Jerral Meth, MD    Reassessment: Patient well-appearing at time of my assessment. Simply waiting on admission callback.  This was performed and patient admitted with no further acute events.     Jerral Meth, MD 01/23/24 [phone]

## 2024-01-22 NOTE — ED Triage Notes (Signed)
 Pt BIB [facility] from home. Pt had a fall while in the bathroom. Pt c/o L hip pain. Pt denies hitting her head, LOC, or vomiting. Pt is on anticoagulants. Pt is A/Ox4 on arrival.   EMS Vitals  124/70 HR 96 RR 17 SpO2 97% on RA

## 2024-01-22 NOTE — ED Provider Notes (Signed)
 [place] EMERGENCY DEPARTMENT AT [facility] Provider Note   CSN: [id number] Arrival date & time: 01/22/24  1755     Patient presents with: No chief complaint on file.   Dawn Roy is a 89 y.o. female.   Pt is a 89 y.o. female with medical history significant for breast cancer status post bilateral mastectomy followed by gel implant in 1991, permanent atrial fibrillation on Eliquis , chronic anxiety/depression, hypertension, history of cellulitis involving bilateral breast silicone gel implants with admission from 04/07/2023 through 04/16/2023 who is presenting today after a fall.  Patient reports that she was in the bathroom and she lost her balance causing her to fall onto the floor.  She was awake the whole time and denied feeling dizzy, lightheaded or having chest pain or shortness of breath prior to the fall or after the fall.  She reports she did not hit her head at any point in time but she was not able to get up off of the floor.  When her daughter got home she found her in the bathroom and at that time she was complaining of pain to her left hip and knee.  Her daughter reports that she has been suffering from cellulitis of her breasts for several weeks now.  She has had this in the past but she is now on her third course of antibiotic and her symptoms are just worsening.  She was on doxycycline  and then switched to Cipro  and is now been on Bactrim  for the last 1 week.  On Thursday they report that she had diarrhea and poor oral intake and slept all day on Friday.  Today she was still generally weak but ate pretty well.  However they reported she was getting choked up on the Bactrim  and they did not give her a dose last night or this morning.  She has not had a fever that they are aware of.  She denies any abdominal pain or vomiting.  She has not had cough or shortness of breath.  She otherwise has been compliant with all of her other medications  The history is provided by  the patient, a caregiver and medical records.       Prior to Admission medications   Medication Sig Start Date End Date Taking? Authorizing Provider  apixaban  (ELIQUIS ) 5 MG TABS tablet Take 1 tablet (5 mg total) by mouth 2 (two) times daily. 12/28/22   Terra Fairy PARAS, PA-C  Cholecalciferol 50 MCG (2000 UT) CAPS Take 2,000 Units by mouth daily after supper.    [provider]  citalopram  (CELEXA ) 40 MG tablet Take 20 mg by mouth daily after supper.    [provider]  diclofenac  Sodium (VOLTAREN ) 1 % GEL Apply 4 g topically 4 (four) times daily. Apply to right knee 08/18/23   Cindy Garnette POUR, MD  doxycycline  (VIBRAMYCIN ) 100 MG capsule Take 1 capsule (100 mg total) by mouth 2 (two) times daily. 09/21/23   Sofia, Leslie K, PA-C  hydrocortisone  1 % ointment Apply topically 2 (two) times daily. Apply to itchy skin 08/18/23   Cindy Garnette POUR, MD  loratadine  (CLARITIN ) 10 MG tablet Take 10 mg by mouth daily after supper.    [provider]  metoprolol  succinate (TOPROL -XL) 25 MG 24 hr tablet Take 0.5 tablets (12.5 mg total) by mouth daily. 08/19/23 09/18/23  Cindy Garnette POUR, MD  polyethylene glycol (MIRALAX  / GLYCOLAX ) 17 g packet Take 17 g by mouth daily as needed for mild constipation.  08/18/23   Cindy Garnette POUR, MD  Probiotic Product (PROBIOTIC DAILY PO) Take 1 capsule by mouth daily after supper. Culturelle    [provider]  silver  sulfADIAZINE  (SILVADENE ) 1 % cream Apply topically daily. Apply to breast wound 08/19/23   Cindy Garnette POUR, MD    Allergies: Azithromycin, Dalmane [flurazepam], Demerol [meperidine], Felodipine, Fosamax [alendronate sodium], Hctz [hydrochlorothiazide], Ivp dye [iodinated contrast media], Keflin [cephalothin], Macrodantin [nitrofurantoin macrocrystal], Morphine and codeine, Phenergan [promethazine hcl], Valium [diazepam], Doxycycline , Keflex [cephalexin], Neosporin [neomycin-bacitracin zn-polymyx], and Penicillins    Review of  Systems  Updated Vital Signs BP 133/74   Pulse (!) 104   Temp 99.5 F (37.5 C) (Rectal)   Resp 18   Ht 5' 2 (1.575 m)   Wt 81.6 kg   SpO2 98%   BMI 32.92 kg/m   Physical Exam Vitals and nursing note reviewed.  Constitutional:      General: She is not in acute distress.    Appearance: She is well-developed.  HENT:     Head: Normocephalic and atraumatic.  Eyes:     Conjunctiva/sclera: Conjunctivae normal.     Pupils: Pupils are equal, round, and reactive to light.  Cardiovascular:     Rate and Rhythm: Tachycardia present. Rhythm irregular.     Heart sounds: No murmur heard. Pulmonary:     Effort: Pulmonary effort is normal. No respiratory distress.     Breath sounds: Normal breath sounds. No wheezing or rales.  Chest:     Comments: Shallow lesions noted to bilateral breasts with mild drainage and surrounding erythema and warmth Abdominal:     General: There is no distension.     Palpations: Abdomen is soft.     Tenderness: There is no abdominal tenderness. There is no guarding or rebound.  Musculoskeletal:        General: Tenderness present.     Cervical back: Normal range of motion and neck supple.     Right hip: Normal.     Left hip: Normal.     Right knee: Effusion present. Normal range of motion. No tenderness.     Left knee: Effusion present. Decreased range of motion. Tenderness present.     Comments: Mild pain noted in the left knee with palpation but patient is able to range the knee spontaneously and is able to lift and move her hip without any pain with axial loading on either leg.  Skin:    General: Skin is warm and dry.     Findings: No erythema or rash.  Neurological:     Mental Status: She is alert and oriented to person, place, and time. Mental status is at baseline.  Psychiatric:        Mood and Affect: Mood normal.        Behavior: Behavior normal.     (all labs ordered are listed, but only abnormal results are displayed) Labs Reviewed  CBC  WITH DIFFERENTIAL/PLATELET - Abnormal; Notable for the following components:      Result Value   WBC 15.0 (*)    RBC 3.62 (*)    Hemoglobin 10.6 (*)    HCT 34.2 (*)    Neutro Abs 12.2 (*)    Monocytes Absolute 1.2 (*)    Abs Immature Granulocytes 0.09 (*)    All other components within normal limits  COMPREHENSIVE METABOLIC PANEL WITH GFR - Abnormal; Notable for the following components:   Chloride 97 (*)    Glucose, Bld 108 (*)    Creatinine,  Ser 1.27 (*)    Calcium 8.6 (*)    Total Protein 6.0 (*)    Albumin 2.6 (*)    GFR, Estimated 40 (*)    All other components within normal limits  URINALYSIS, W/ REFLEX TO CULTURE (INFECTION SUSPECTED) - Abnormal; Notable for the following components:   Color, Urine AMBER (*)    APPearance HAZY (*)    Protein, ur 30 (*)    Nitrite POSITIVE (*)    Leukocytes,Ua SMALL (*)    Bacteria, UA MANY (*)    All other components within normal limits  CULTURE, BLOOD (ROUTINE X 2)  CULTURE, BLOOD (ROUTINE X 2)  I-STAT CG4 LACTIC ACID, ED    EKG: None  Radiology: DG Knee Complete 4 Views Left Result Date: 01/22/2024 CLINICAL DATA:  Fell, pain EXAM: LEFT KNEE - COMPLETE 4+ VIEW COMPARISON:  None Available. FINDINGS: Frontal, bilateral oblique, lateral views of the left knee are obtained. No acute fracture, subluxation, or dislocation. There is severe 3 compartmental osteoarthritis. No joint effusion. Soft tissues are unremarkable. IMPRESSION: 1. Severe 3 compartmental osteoarthritis. 2. No acute fracture. Electronically Signed   By: Ozell Daring M.D.   On: 01/22/2024 21:00   DG Hip Unilat With Pelvis 2-3 Views Left Result Date: 01/22/2024 CLINICAL DATA:  [name], left hip pain EXAM: DG HIP (WITH OR WITHOUT PELVIS) 2-3V LEFT COMPARISON:  None Available. FINDINGS: Frontal view of the pelvis as well as frontal and frogleg lateral views of the left hip are obtained. No acute fracture, subluxation, or dislocation. Mild symmetrical bilateral hip  osteoarthritis. Moderate lower lumbar spondylosis and facet hypertrophy. The sacroiliac joints are unremarkable. IMPRESSION: 1. No acute displaced fracture. 2. Degenerative changes of the lumbar spine and bilateral hips. Electronically Signed   By: Ozell Daring M.D.   On: 01/22/2024 18:48     Procedures   Medications Ordered in the ED  vancomycin  (VANCOREADY) IVPB 1500 mg/300 mL (has no administration in time range)  lactated ringers  bolus 500 mL (0 mLs Intravenous Stopped 01/22/24 2148)  oxyCODONE -acetaminophen  (PERCOCET/ROXICET) 5-325 MG per tablet 1 tablet (1 tablet Oral Given 01/22/24 2053)    Clinical Course as of 01/22/24 2324  Sat Jan 22, 2024  2306 Stable HO WP 77 YOF with GLF On Hospice. 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  3 weeks of antibiotics   [CC]    Clinical Course User Index [CC] Jerral Meth, MD                                 Medical Decision Making Amount and/or Complexity of Data Reviewed Independent Historian: caregiver and EMS External Data Reviewed: notes. Labs: ordered. Decision-making details documented in ED Course. Radiology: ordered and independent interpretation performed. Decision-making details documented in ED Course.  Risk Prescription drug management.   Pt with multiple medical problems and comorbidities and presenting today with a complaint that caries a high risk for morbidity and mortality.  Here today with the above complaints.  Concern for possible injury after her fall.  Concern fall may have been related to the medical issues she has going on recently as patient is having diarrhea has been on multiple antibiotics and having cellulitis of her breast which is thought to be from possible leaking implants that she got after having bilateral mastectomies.  Daughter denies any fever however concern for possible sepsis in a patient this age with these ongoing complaints.  Also concern for possible  dehydration or electrolyte abnormality as she has been having diarrhea.  Low suspicion for hip or knee fracture at this time but will do imaging of both to ensure no acute fracture.  Patient given fluid bolus due to concern for dehydration.  Last EF was 60 to 65%.  She is on continuous cardiac monitoring which I independently interpreted and appears to have a mild tachycardia and A-fib which is her baseline. I independently interpreted patient's EKG and labs.  CBC today with a leukocytosis of 15 with stable hemoglobin, normal lactic acid, CMP with mild AKI today of 1.27 from her baseline of 1 with normal LFTs.  UA today with positive nitrites, small leukocytes and many bacteria with a history of resistance to ampicillin, Bactrim  and Cipro . I have independently visualized and interpreted pt's images today.  Hip film is negative for fracture as well as the knee but does show degenerative changes. After discussing the findings with the patient and her family member.  Given her failure to respond to oral antibiotics and now on her third week with worsening symptoms of cellulitis of her breast feel that she needs admission for IV antibiotics and will start vancomycin .  Urine culture was done however unclear if patient has a new UTI.  Feel that patient needs admission at this time which patient and her daughter are agreeable to.       Final diagnoses:  Cellulitis of female breast  Fall, initial encounter  Weakness    ED Discharge Orders     None          Doretha Folks, MD 01/22/24 2324

## 2024-01-23 DIAGNOSIS — N61 Mastitis without abscess: Secondary | ICD-10-CM | POA: Diagnosis not present

## 2024-01-23 DIAGNOSIS — R8271 Bacteriuria: Secondary | ICD-10-CM | POA: Diagnosis present

## 2024-01-23 DIAGNOSIS — W19XXXA Unspecified fall, initial encounter: Secondary | ICD-10-CM

## 2024-01-23 DIAGNOSIS — N1831 Chronic kidney disease, stage 3a: Secondary | ICD-10-CM | POA: Diagnosis present

## 2024-01-23 LAB — CBC
HCT: 31.5 % — ABNORMAL LOW (ref 36.0–46.0)
Hemoglobin: 9.7 g/dL — ABNORMAL LOW (ref 12.0–15.0)
MCH: 29.3 pg (ref 26.0–34.0)
MCHC: 30.8 g/dL (ref 30.0–36.0)
MCV: 95.2 fL (ref 80.0–100.0)
Platelets: 292 K/uL (ref 150–400)
RBC: 3.31 MIL/uL — ABNORMAL LOW (ref 3.87–5.11)
RDW: 13.5 % (ref 11.5–15.5)
WBC: 12.4 K/uL — ABNORMAL HIGH (ref 4.0–10.5)
nRBC: 0 % (ref 0.0–0.2)

## 2024-01-23 LAB — BASIC METABOLIC PANEL WITH GFR
Anion gap: 10 (ref 5–15)
BUN: 17 mg/dL (ref 8–23)
CO2: 27 mmol/L (ref 22–32)
Calcium: 8.1 mg/dL — ABNORMAL LOW (ref 8.9–10.3)
Chloride: 97 mmol/L — ABNORMAL LOW (ref 98–111)
Creatinine, Ser: 1.26 mg/dL — ABNORMAL HIGH (ref 0.44–1.00)
GFR, Estimated: 40 mL/min — ABNORMAL LOW (ref >60)
Glucose, Bld: 129 mg/dL — ABNORMAL HIGH (ref 70–99)
Potassium: 3.8 mmol/L (ref 3.5–5.1)
Sodium: 134 mmol/L — ABNORMAL LOW (ref 135–145)

## 2024-01-23 MED ORDER — CITALOPRAM HYDROBROMIDE 20 MG PO TABS
20.0000 mg | ORAL_TABLET | Freq: Every day | ORAL | Status: DC
  Administered 2024-01-23 – 2024-01-27 (×5): 20 mg via ORAL
  Filled 2024-01-23 (×5): qty 1

## 2024-01-23 MED ORDER — DICLOFENAC SODIUM 1 % EX GEL
4.0000 g | Freq: Three times a day (TID) | CUTANEOUS | Status: DC
  Administered 2024-01-23 – 2024-01-28 (×18): 4 g via TOPICAL
  Filled 2024-01-23 (×3): qty 100

## 2024-01-23 MED ORDER — ACETAMINOPHEN 325 MG PO TABS
650.0000 mg | ORAL_TABLET | Freq: Four times a day (QID) | ORAL | Status: DC | PRN
  Administered 2024-01-23 – 2024-01-27 (×6): 650 mg via ORAL
  Filled 2024-01-23 (×6): qty 2

## 2024-01-23 MED ORDER — ONDANSETRON HCL 4 MG/2ML IJ SOLN: 4.0000 mg | Freq: Four times a day (QID) | INTRAMUSCULAR | Status: DC | PRN

## 2024-01-23 MED ORDER — ONDANSETRON HCL 4 MG PO TABS: 4.0000 mg | ORAL_TABLET | Freq: Four times a day (QID) | ORAL | Status: DC | PRN

## 2024-01-23 MED ORDER — VANCOMYCIN HCL 500 MG/100ML IV SOLN
500.0000 mg | INTRAVENOUS | Status: DC
  Administered 2024-01-24 – 2024-01-27 (×4): 500 mg via INTRAVENOUS
  Filled 2024-01-23 (×4): qty 100

## 2024-01-23 MED ORDER — METOPROLOL SUCCINATE ER 25 MG PO TB24
25.0000 mg | ORAL_TABLET | Freq: Every day | ORAL | Status: DC
  Administered 2024-01-23 – 2024-01-28 (×6): 25 mg via ORAL
  Filled 2024-01-23 (×6): qty 1

## 2024-01-23 MED ORDER — ACETAMINOPHEN 650 MG RE SUPP: 650.0000 mg | Freq: Four times a day (QID) | RECTAL | Status: DC | PRN

## 2024-01-23 MED ORDER — OXYCODONE HCL 5 MG PO TABS
5.0000 mg | ORAL_TABLET | Freq: Four times a day (QID) | ORAL | Status: DC | PRN
  Administered 2024-01-23 – 2024-01-27 (×7): 5 mg via ORAL
  Filled 2024-01-23 (×7): qty 1

## 2024-01-23 MED ORDER — SENNOSIDES-DOCUSATE SODIUM 8.6-50 MG PO TABS
1.0000 | ORAL_TABLET | Freq: Every evening | ORAL | Status: DC | PRN
  Administered 2024-01-26 – 2024-01-27 (×2): 1 via ORAL
  Filled 2024-01-23 (×2): qty 1

## 2024-01-23 MED ORDER — COLLAGENASE 250 UNIT/GM EX OINT
TOPICAL_OINTMENT | Freq: Every day | CUTANEOUS | Status: DC
  Filled 2024-01-23: qty 30

## 2024-01-23 MED ORDER — BISACODYL 5 MG PO TBEC
5.0000 mg | DELAYED_RELEASE_TABLET | Freq: Every day | ORAL | Status: DC | PRN
  Administered 2024-01-26 – 2024-01-27 (×2): 5 mg via ORAL
  Filled 2024-01-23 (×2): qty 1

## 2024-01-23 MED ORDER — SODIUM CHLORIDE 0.9 % IV SOLN
2.0000 g | Freq: Every day | INTRAVENOUS | Status: DC
  Administered 2024-01-23 – 2024-01-26 (×5): 2 g via INTRAVENOUS
  Filled 2024-01-23 (×5): qty 20

## 2024-01-23 MED ORDER — APIXABAN 5 MG PO TABS
5.0000 mg | ORAL_TABLET | Freq: Two times a day (BID) | ORAL | Status: DC
  Administered 2024-01-23 – 2024-01-28 (×11): 5 mg via ORAL
  Filled 2024-01-23 (×11): qty 1

## 2024-01-23 NOTE — Consult Note (Addendum)
 WOC Nurse Consult Note: Reason for Consult: Recurrent bilateral breast cellulitis with superficial wounds  Wound type:  Right breast chronic non healing wounds x 3 Necrotic left breast wound  Pressure Injury POA: NA Measurement: see measurements  Wound bed: Right breast x 3 all clean and 100% pink Left breast 100% yellow  Drainage (amount, consistency, odor) see nursing flow sheets Periwound: left breast has circumferential erythema  Dressing procedure/placement/frequency: Xeroform to the right breast wounds Enzymatic debridement to the right breast wound.  Consider US  or imaging of the right breast to rule out anything other than superficial wound.   Discussed POC withbedside nurse.  Re consult if needed, will not follow at this time. Thanks  Janes Colegrove [employer], RN,CWOCN, CNS, [employer] [phone]

## 2024-01-23 NOTE — Plan of Care (Incomplete)

## 2024-01-23 NOTE — Plan of Care (Signed)
  Problem: Education: Goal: Knowledge of General Education information will improve Description: Including pain rating scale, medication(s)/side effects and non-pharmacologic comfort measures Outcome: Not Progressing   Problem: Health Behavior/Discharge Planning: Goal: Ability to manage health-related needs will improve Outcome: Not Progressing   Problem: Clinical Measurements: Goal: Ability to maintain clinical measurements within normal limits will improve Outcome: Not Progressing   Problem: Activity: Goal: Risk for activity intolerance will decrease Outcome: Not Progressing   Problem: Nutrition: Goal: Adequate nutrition will be maintained Outcome: Not Progressing   Problem: Coping: Goal: Level of anxiety will decrease Outcome: Not Progressing

## 2024-01-23 NOTE — Progress Notes (Signed)
 [facility]                                                                               Dawn Roy, is a 89 y.o. female, DOB - 1930/02/06, FMW:[id number] Admit date - 01/22/2024    Outpatient Primary MD for the patient is Varadarajan, Rupashree, MD  LOS - 0  days    Brief summary   Dawn Roy is a 89 y.o. 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   Assessment & Plan    Assessment and Plan:  Recurrent cellulitis of bilateral breasts With open wounds.  They have tried outpatient antibiotics and failed .  She was started on broad spectrum IV antibiotics, recommend to continue the same.  Pain control.  Wound care consulted     Fall at home.  Consistent with mechanical fall.  PT/OT eval pending.    Persistent Atrial fibrillation Reate controlled.  Resume Eliquis  and metoprolol .    Asymptomatic bacteriuria No urinary symptoms.     Anemia of chronic disease Hemoglobin stable around 10.    Depression / anxiety Resume home meds.    Of note patient is active with home hospice.   Estimated body mass index is 32.92 kg/m as calculated from the following:   Height as of this encounter: 5' 2 (1.575 m).   Weight as of this encounter: 81.6 kg.  Code Status: DNR limited.  DVT Prophylaxis:   apixaban  (ELIQUIS ) tablet 5 mg   Level of Care: Level of care: Telemetry Family Communication: Updated patient's family at bedside.   Disposition Plan:     Remains inpatient appropriate:  pending   Procedures:  None.   Consultants:   None.   Antimicrobials:   Anti-infectives (From admission, onward)    Start     Dose/Rate Route Frequency  Ordered Stop   01/24/24 0600  vancomycin  (VANCOREADY) IVPB 500 mg/100 mL        500 mg 100 mL/hr over 60 Minutes Intravenous Every 24 hours 01/23/24 0118     01/23/24 0115  cefTRIAXone  (ROCEPHIN ) 2 g in sodium chloride  0.9 % 100 mL IVPB        2 g 200 mL/hr over 30 Minutes Intravenous Daily at bedtime 01/23/24 0059     01/22/24 2330  vancomycin  (VANCOREADY) IVPB 1500 mg/300 mL        1,500 mg 150 mL/hr over 120 Minutes Intravenous  Once 01/22/24 2322 01/23/24 0246        Medications  Scheduled Meds:  apixaban   5 mg Oral BID   citalopram   20 mg Oral QPC supper   diclofenac  Sodium  4 g Topical TID AC & HS   metoprolol  succinate  25 mg Oral Daily   Continuous Infusions:  cefTRIAXone  (ROCEPHIN )  IV Stopped (01/23/24 0246)   [START ON 01/24/2024] vancomycin      PRN Meds:.acetaminophen  **OR** acetaminophen , bisacodyl, ondansetron  **OR** ondansetron  (ZOFRAN ) IV, oxyCODONE , senna-docusate    Subjective:  Dawn Roy was seen and examined today.  Cheerful, no new complaints.   Objective:   Vitals:   01/23/24 0208 01/23/24 0604 01/23/24 0922 01/23/24 1200  BP:  106/74 101/62 (!) 119/59  Pulse:  80 98 84  Resp:  17 18 12   Temp: 98.3 F (36.8 C) 98 F (36.7 C) 97.8 F (36.6 C)   TempSrc: Oral Oral    SpO2:  94% 96% 95%  Weight:      Height:        Intake/Output Summary (Last 24 hours) at 01/23/2024 1335 Last data filed at 01/22/2024 2148 Gross per 24 hour  Intake 500 ml  Output --  Net 500 ml   Filed Weights   01/22/24 1803  Weight: 81.6 kg     Exam General: Alert and oriented x 3, NAD Cardiovascular: S1 S2 auscultated, no murmurs, RRR Respiratory: Clear to auscultation bilaterally, no wheezing, rales or rhonchi Gastrointestinal: Soft, nontender, nondistended, + bowel sounds Ext: no pedal edema bilaterally    Data Reviewed:  I have personally reviewed following labs and imaging studies   CBC Lab Results  Component Value Date   WBC 12.4 (H)  01/23/2024   RBC 3.31 (L) 01/23/2024   HGB 9.7 (L) 01/23/2024   HCT 31.5 (L) 01/23/2024   MCV 95.2 01/23/2024   MCH 29.3 01/23/2024   PLT 292 01/23/2024   MCHC 30.8 01/23/2024   RDW 13.5 01/23/2024   LYMPHSABS 1.4 01/22/2024   MONOABS 1.2 (H) 01/22/2024   EOSABS 0.1 01/22/2024   BASOSABS 0.0 01/22/2024     Last metabolic panel Lab Results  Component Value Date   NA 134 (L) 01/23/2024   K 3.8 01/23/2024   CL 97 (L) 01/23/2024   CO2 27 01/23/2024   BUN 17 01/23/2024   CREATININE 1.26 (H) 01/23/2024   GLUCOSE 129 (H) 01/23/2024   GFRNONAA 40 (L) 01/23/2024   CALCIUM 8.1 (L) 01/23/2024   PHOS 3.0 08/09/2023   PROT 6.0 (L) 01/22/2024   ALBUMIN 2.6 (L) 01/22/2024   BILITOT 0.8 01/22/2024   ALKPHOS 60 01/22/2024   AST 21 01/22/2024   ALT 11 01/22/2024   ANIONGAP 10 01/23/2024    CBG (last 3)  No results for input(s): GLUCAP in the last 72 hours.    Coagulation Profile: No results for input(s): INR, PROTIME in the last 168 hours.   Radiology Studies: DG Knee Complete 4 Views Left Result Date: 01/22/2024 CLINICAL DATA:  Dawn Roy, pain EXAM: LEFT KNEE - COMPLETE 4+ VIEW COMPARISON:  None Available. FINDINGS: Frontal, bilateral oblique, lateral views of the left knee are obtained. No acute fracture, subluxation, or dislocation. There is severe 3 compartmental osteoarthritis. No joint effusion. Soft tissues are unremarkable. IMPRESSION: 1. Severe 3 compartmental osteoarthritis. 2. No acute fracture. Electronically Signed   By: Dawn Roy M.D.   On: 01/22/2024 21:00   DG Hip Unilat With Pelvis 2-3 Views Left Result Date: 01/22/2024 CLINICAL DATA:  Dawn Roy, left hip pain EXAM: DG HIP (WITH OR WITHOUT PELVIS) 2-3V LEFT COMPARISON:  None Available. FINDINGS: Frontal view of the pelvis as well as frontal and frogleg lateral views of the left hip are obtained. No acute fracture, subluxation, or dislocation. Mild symmetrical bilateral hip osteoarthritis. Moderate lower lumbar  spondylosis and facet hypertrophy. The sacroiliac joints are unremarkable. IMPRESSION: 1. No acute displaced fracture. 2. Degenerative changes of the lumbar spine and bilateral hips. Electronically Signed   By: Dawn Roy M.D.   On: 01/22/2024 18:48  Dawn Roy M.D. [facility] 01/23/2024, 1:35 PM  Available via Epic secure chat 7am-7pm After 7 pm, please refer to night coverage provider listed on amion.

## 2024-01-23 NOTE — Hospital Course (Signed)
 Dawn Roy is a 89 y.o. 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

## 2024-01-23 NOTE — Progress Notes (Signed)
 Pharmacy Antibiotic Note  Dawn Roy is a 89 y.o. female admitted on 01/22/2024 with cellulitis.  Pharmacy has been consulted for Vancomycin  dosing.  Plan: Vancomycin  500 mg IV q24h  Height: 5' 2 (157.5 cm) Weight: 81.6 kg (180 lb) IBW/kg (Calculated) : 50.1  Temp (24hrs), Avg:98.9 F (37.2 C), Min:98.2 F (36.8 C), Max:99.5 F (37.5 C)  Recent Labs  Lab 01/22/24 2033 01/22/24 2059  WBC 15.0*  --   CREATININE 1.27*  --   LATICACIDVEN  --  1.5    Estimated Creatinine Clearance: 28 mL/min (A) (by C-G formula based on SCr of 1.27 mg/dL (H)).    Allergies  Allergen Reactions   Azithromycin Hives   Dalmane [Flurazepam] Other (See Comments)    unknown   Demerol [Meperidine] Other (See Comments)    Unknown    Felodipine Other (See Comments)    unknown   Fosamax [Alendronate Sodium] Other (See Comments)    unknown   Hctz [Hydrochlorothiazide] Other (See Comments)    Unknown    Ivp Dye [Iodinated Contrast Media] Other (See Comments)    unknown   Keflin [Cephalothin] Other (See Comments)    unknown   Macrodantin [Nitrofurantoin Macrocrystal] Other (See Comments)    unknown   Morphine And Codeine Other (See Comments)    unknown   Phenergan [Promethazine Hcl] Other (See Comments)    unknown   Valium [Diazepam] Other (See Comments)    unknown   Doxycycline  Rash    headache   Keflex [Cephalexin] Rash   Neosporin [Neomycin-Bacitracin Zn-Polymyx] Rash   Penicillins Rash    Dawn Roy 01/23/2024 1:15 AM

## 2024-01-23 NOTE — Consult Note (Signed)
 Please note that the [facility] nursing team is utilizing a standardized work plan to manage patient consults. We are triaging consults and will try to see the patients within 48 hours. Wound photos in the patient's chart allow us  to consult on the patient in the most efficient and timely manner.    Daimian Sudberry [facility], CNS, CWON-AP [phone]

## 2024-01-23 NOTE — Evaluation (Signed)
 Physical Therapy Evaluation Patient Details Name: Dawn Roy MRN: [id number] DOB: 23-Nov-1929 Today's Date: 01/23/2024  History of Present Illness  Pt is a 89 y.o. female who presented 01/22/24 s/p fall. Pt also found to have cellulitis of bil breasts. 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 PTA, she was mod I using a RW for household mobility (up to ~50 ft), but needed assistance for stair negotiation. She lives with her daughter in a 1-level house with a ramped entry and x1 stair inside the house to enter the living room. Her daughter is available to assist her the majority of the time, leaving only intermittently to go grocery shopping as needed. Currently, the pt is displaying deficits in cognition (daughter thinks may be due to antibiotics or infection), balance, strength, and endurance. She is at risk for falls, but this seems to be the case at baseline. She needed mod-maxA for bed mobility on a stretcher today, but likely would do better on her hospital bed at home. She needed CGA to stand from an elevated surface but modA to transfer to stand from a low surface, again she has a lift chair at home so this is likely not far from baseline. She ambulated up to ~20 ft with a RW and CGA this date, limited by chronic bil knee pain. As the pt appears to be not too far from her functional baseline and has good support and all the DME she needs at home, will recommend home with HHPT. Will continue to follow acutely. Educated pt and daughter on her risk for falls and need for assistance with all standing mobility. Daughter already has a gait  belt at home. Educated them on a walking program and on [employer] HEP handout with Access Code: [email or web address] provided to pt and daughter. They verbalized understanding.         If plan is discharge home, recommend the following: A little help with walking and/or transfers;A lot of help with bathing/dressing/bathroom;Assistance with cooking/housework;Direct supervision/assist for medications management;Direct supervision/assist for financial management;Assist for transportation;Help with stairs or ramp for entrance;Supervision due to cognitive status   Can travel by private vehicle        Equipment Recommendations None recommended by PT  Recommendations for Other Services       Functional Status Assessment Patient has had a recent decline in their functional status and demonstrates the ability to make significant improvements in function in a reasonable and predictable amount of time.     Precautions / Restrictions Precautions Precautions: Fall Restrictions Weight Bearing Restrictions Per Provider Order: No      Mobility  Bed Mobility Overal bed mobility: Needs Assistance Bed Mobility: Supine to Sit, Sit to Supine     Supine to sit: Mod assist, HOB elevated Sit to supine: Max assist, HOB elevated   General bed mobility comments: Pt able to negotiate legs off R edge of stretcher but needed modA HHA to ascend trunk and scoot hips to edge of stretcher. MaxA to manage trunk and legs safely to supine due to awkward height of stretcher and pt's short stature    Transfers Overall transfer level: Needs assistance Equipment used: Rolling walker (2 wheels) Transfers: Sit to/from Stand Sit to  Stand: Contact guard assist, Mod assist           General transfer comment: Pt needed extra time and CGA to stand from edge of stretcher, which rests high. ModA to power up to stand from low chair, cuing pt to push up from arm rests of chair.    Ambulation/Gait Ambulation/Gait assistance:  Contact guard assist Gait Distance (Feet): 20 Feet (x2 bouts of ~4 ft > ~20 ft) Assistive device: Rolling walker (2 wheels) Gait Pattern/deviations: Step-through pattern, Decreased step length - right, Decreased step length - left, Decreased stride length, Trunk flexed Gait velocity: reduced Gait velocity interpretation: <1.31 ft/sec, indicative of household ambulator   General Gait Details: Pt takes slow, small steps with a flexed posture. Pt limited in distance by bil leg (chronic knee) pain. No LOB, CGA for safety  Stairs            Wheelchair Mobility     Tilt Bed    Modified Rankin (Stroke Patients Only)       Balance Overall balance assessment: Needs assistance Sitting-balance support: No upper extremity supported, Feet supported Sitting balance-Leahy Scale: Fair     Standing balance support: Bilateral upper extremity supported, During functional activity, Reliant on assistive device for balance Standing balance-Leahy Scale: Poor Standing balance comment: reliant on RW                             Pertinent Vitals/Pain Pain Assessment Pain Assessment: Faces Faces Pain Scale: Hurts even more Pain Location: bil legs (chronic knee pain) Pain Descriptors / Indicators: Discomfort, Grimacing, Guarding, Sore Pain Intervention(s): Limited activity within patient's tolerance, Monitored during session, Repositioned    Home Living Family/patient expects to be discharged to:: Private residence Living Arrangements: Children Available Help at Discharge: Family;Available 24 hours/day (close to 24/7, daughter has to run out to store intermittently, but she does not work) Type of Home: House Home Access: Ramped entrance       Home Layout: One level (+1 step up into living room, there is a rail on either side but pt only uses x1 rail at a time) Home Equipment: Grab bars - tub/shower;Grab bars - toilet;Rolling Walker (2 wheels);Wheelchair - power;Shower seat;Lift  chair;Toilet riser;BSC/3in1;Hospital bed      Prior Function Prior Level of Function : Needs assist             Mobility Comments: Mod I using RW to mobilize in house except needs assist on x1 stair in house; walks up to ~50 ft before she fatigues ADLs Comments: wears adult briefs, assist for bathing, needs assistance to get into and out of shower, family does IaDLs, needs assistance for dressing     Extremity/Trunk Assessment   Upper Extremity Assessment Upper Extremity Assessment: Defer to OT evaluation    Lower Extremity Assessment Lower Extremity Assessment: Generalized weakness;RLE deficits/detail;LLE deficits/detail RLE Deficits / Details: grossly 4/5 strength; tightness noted in calves and hamstrings with pt lacking ~10-15' knee extension; no warmth or redness at painful calves, seems more due to tightness LLE Deficits / Details: grossly 4/5 strength; tightness noted in calves and hamstrings with pt lacking ~10-15' knee extension; no warmth or redness at painful calves, seems more due to tightness       Communication   Communication Communication: No apparent difficulties    Cognition Arousal: Alert Behavior During Therapy: WFL for tasks assessed/performed   PT - Cognitive impairments: Awareness, Attention, Sequencing, Problem solving, Initiation  PT - Cognition Comments: Pt easily distracted and tangential. Daughter reports this is unlike pt and thinks it may be due to the antibiotics or potential concern for UTI? Pt follows simple cues with extra time and needs encouragement and reassurance she can complete the activity Following commands: Impaired Following commands impaired: Follows one step commands with increased time     Cueing Cueing Techniques: Verbal cues, Tactile cues     General Comments General comments (skin integrity, edema, etc.): Educated pt and daughter on her risk for falls and need for assistance with all standing  mobility. Daughter already has a gait belt at home. Educated them on a walking program and on MedBridge HEP handout with Access Code: [email or web address] provided to pt and daughter. They verbalized understanding.    Exercises     Assessment/Plan    PT Assessment Patient needs continued PT services  PT Problem List Decreased strength;Decreased activity tolerance;Decreased balance;Decreased mobility;Decreased cognition;Pain       PT Treatment Interventions DME instruction;Gait training;Stair training;Functional mobility training;Therapeutic activities;Therapeutic exercise;Balance training;Neuromuscular re-education;Patient/family education;Cognitive remediation    PT Goals (Current goals can be found in the Care Plan section)  Acute Rehab PT Goals Patient Stated Goal: to improve and go home PT Goal Formulation: With patient/family Time For Goal Achievement: 02/06/24 Potential to Achieve Goals: Good    Frequency Min 2X/week     Co-evaluation               AM-PAC PT 6 Clicks Mobility  Outcome Measure Help needed turning from your back to your side while in a flat bed without using bedrails?: A Lot Help needed moving from lying on your back to sitting on the side of a flat bed without using bedrails?: A Lot Help needed moving to and from a bed to a chair (including a wheelchair)?: A Little Help needed standing up from a chair using your arms (e.g., wheelchair or bedside chair)?: A Lot Help needed to walk in hospital room?: A Little Help needed climbing 3-5 steps with a railing? : Total 6 Click Score: 13    End of Session Equipment Utilized During Treatment: Gait belt Activity Tolerance: Patient tolerated treatment well Patient left: in bed;with call bell/phone within reach;with family/visitor present Nurse Communication: Mobility status PT Visit Diagnosis: Unsteadiness on feet (R26.81);Other abnormalities of gait and mobility (R26.89);Muscle weakness (generalized) (M62.81);History  of falling (Z91.81);Difficulty in walking, not elsewhere classified (R26.2)    Time: 9161-9079 PT Time Calculation (min) (ACUTE ONLY): 42 min   Charges:   PT Evaluation $PT Eval Moderate Complexity: 1 Mod PT Treatments $Gait Training: 8-22 mins $Therapeutic Activity: 8-22 mins PT General Charges $$ ACUTE PT VISIT: 1 Visit         Theo Ferretti, PT, DPT Acute Rehabilitation Services  Office: [phone]   Theo CHRISTELLA Ferretti 01/23/2024, 9:46 AM

## 2024-01-23 NOTE — Progress Notes (Signed)
 Dawn Roy ED [phone] [facility] Liaison Note   Dawn Roy is a current hospice patient with a terminal diagnosis of Moderate Protein Calorie Malnutrition. Patient family contacted [facility] and reported that patient had a fall in the bathroom and had been on the floor for about an hour and was complaining of left hip pain. EMS was activated and patient was transported to hospital. She was admitted to [facility] on 11.16.25 for Recurrent cellulitis of bilateral breasts with open wounds requiring IV antibiotics. Per Dr. Deane Finder with [facility], this is a related hospital admission. Patient is a DNR.   Met with patient and family at bedside. She was awake, alert and resting comfortably. Patient pleasant and reported events that led to hospitalization. Patient did report some knee pain, but denied that she needed any medications for it. Patient verbalized understanding that she was being admitted to the hospital and reasoning. Patient and family denied any questions or concerns at this time. Aware [facility] Liaison will round daily until discharge.     Pt is inpatient appropriate due to treatment of cellulitis requiring IV antibiotics for management.   VS: 97.8/87/18   115/72    95% on ra   I/O: 500/none documented   Abnormal Labs:  01/22/24 20:33 Comprehensive metabolic panel with GFR: Rpt ! Chloride: 97 (L) Glucose: 108 (H) Creatinine: 1.27 (H) Calcium: 8.6 (L) Albumin: 2.6 (L) Total Protein: 6.0 (L) GFR, Estimated: 40 (L) WBC: 15.0 (H) RBC: 3.62 (L) Hemoglobin: 10.6 (L) HCT: 34.2 (L) NEUT#: 12.2 (H) Monocyte #: 1.2 (H) Abs Immature Granulocytes: 0.09 (H)  Blood Cultures x2 01/22/24 23:39  Results Pending   01/23/24 02:16 Basic metabolic panel with GFR: Rpt ! Sodium: 134 (L) Chloride: 97 (L) Glucose: 129 (H) Creatinine: 1.26 (H) Calcium: 8.1 (L) GFR, Estimated: 40 (L) WBC: 12.4 (H) RBC: 3.31 (L) Hemoglobin: 9.7 (L) HCT: 31.5 (L)    Diagnostics:  DG Hip Unilat With Pelvis 2-3 Views Left 01/22/2024 Department: Dawn Roy 2 [facility] Unit Released By: Delores Juliene SAILOR, RN (auto-released) Authorizing: Doretha Folks, MD  Narrative & Impression CLINICAL DATA:  Dawn Roy, left hip pain   IMPRESSION: 1. No acute displaced fracture. 2. Degenerative changes of the lumbar spine and bilateral hips.     Electronically Signed   By: Ozell Delores M.D.   On: 01/22/2024 18:48  DG Knee Complete 4 Views Left  Date: 01/22/2024  Department: Dawn Roy 2 [facility] Medical Unit Released By/Authorizing: Doretha Folks, MD (auto-released)  Narrative & Impression CLINICAL DATA:  Dawn Roy, pain   EXAM: LEFT KNEE - COMPLETE 4+ VIEW   COMPARISON:  None Available.   FINDINGS: Frontal, bilateral oblique, lateral views of the left knee are obtained. No acute fracture, subluxation, or dislocation. There is severe 3 compartmental osteoarthritis. No joint effusion. Soft tissues are unremarkable.   IMPRESSION: 1. Severe 3 compartmental osteoarthritis. 2. No acute fracture.     Electronically Signed   By: Ozell Delores M.D.   On: 01/22/2024 21:00  01/22/24 21:29 Urinalysis, w/ Reflex to Culture (Infection Suspected): Rpt ! Appearance: HAZY ! Color, Urine: AMBER ! Leukocytes,Ua: SMALL ! Nitrite: POSITIVE ! Bacteria, UA: MANY !    IV/PRN meds: ROCEPHIN  2 g in sodium chloride  0.9 % 100 mL IVPB  x1, Lactated Ringers  500ml Bolus IV x1, Vancomycin  1500mg  IV x1, Tylenol  650mg  PO x1,    Assessment and Plan (per Cherlyn Labella, MD on 11.16.25)   Assessment and Plan: Recurrent cellulitis of bilateral breasts With open  wounds.  They have tried outpatient antibiotics and failed .  She was started on broad spectrum IV antibiotics, recommend to continue the same.  Pain control.  Wound care consulted        Fall at home.  Consistent with mechanical fall.  PT/OT eval pending.      Persistent Atrial fibrillation Reate controlled.   Resume Eliquis  and metoprolol .      Asymptomatic bacteriuria No urinary symptoms.        Anemia of chronic disease Hemoglobin stable around 10.      Depression / anxiety Resume home meds. .     Discharge Planning: ongoing, home when medically stable   Family Contact: Spoke with family at bedside    IDT: updated   Goals of Care: Limited DNR   Medication List and Transfer Summary uploaded to patient chart.   Should patient need ambulance transport at discharge, please call [facility] as we contract with them for our active hospice patients.   Please call with any hospice questions or concerns.   Thank you, Nat Babe, BSN, [facility] [phone]

## 2024-01-23 NOTE — Progress Notes (Signed)
 Dawn Roy 003 [facility] Liaison Note     This patient is a current hospice patient with [facility]. Please see media tab for detailed hospice report.    Liaison will continue to follow for any discharge planning needs and to coordinate continuation of hospice care.    Please don't hesitate to call with any Hospice related questions or concerns.     Nat Babe, BSN, RN [employer] [phone]

## 2024-01-23 NOTE — H&P (Signed)
 History and Physical    Dawn Roy FMW:[id number] DOB: 22-Sep-1929 DOA: 01/22/2024  PCP: Elliot Charm, MD  Patient coming from: Home  I have personally briefly reviewed patient's old medical records in [facility]  Chief Complaint: Fall at home  HPI: Dawn Roy is a 89 y.o. 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  Patient states that she was alone at home earlier in the day while family were out.  She was in the bathroom when she lost her balance and fell.  She says that she landed on her left hip and was stuck on the floor until she can get help.  She says she did not hit her head or lose consciousness.  She did not have any preceding lightheadedness, dizziness, chest pain, dyspnea, nausea, vomiting, diaphoresis.  She says she was having left hip pain earlier which has since improved.  Patient states she normally ambulates with use of a walker or electric wheelchair.  Patient and family also report recurrent skin infection to her breasts with very superficial ulcerated wounds.  This is a recurrent issue due to frequent picking of the area by the patient.  She was last admitted in June for the same and treated with IV antibiotics and wound care with improvement. Family notes that patient has seemed more lethargic these last couple days.  She has not had improvement in the cellulitis despite several weeks of outpatient antibiotics including doxycycline , ciprofloxacin , and Bactrim .  She is active with hospice but family and patient do wish to continue nonaggressive interventions such as IV antibiotics, IV fluids, etc. as warranted.  ED  Course  Labs/Imaging on admission: I have personally reviewed following labs and imaging studies.  Initial vitals showed BP 117/52, pulse 112, RR 18, temp 98.2 F, SpO2 99% on room air.  Labs showed WBC 15.0, hemoglobin 10.6, platelets 332, sodium 135, potassium 3.9, bicarb 29, BUN 16, creatinine 1.27, serum glucose 108, LFTs within normal limits, lactic acid 1.5.  UA positive nitrites, small leukocytes, 0-5 RBCs and WBCs, many bacteria.  Blood cultures collected and pending.  Left hip x-ray negative for acute displaced fracture.  Left knee x-ray negative for acute fracture.  Patient was given 5 1 cc LR and IV vancomycin .  The hospitalist service was consulted for admission.  Review of Systems: All systems reviewed and are negative except as documented in history of present illness above.   Past Medical History:  Diagnosis Date   Arthritis    Basal cell carcinoma    Breast cancer (HCC)    Depression    Hypertension    Osteoporosis    Vertigo     Past Surgical History:  Procedure Laterality Date   ABDOMINAL HYSTERECTOMY     BREAST RECONSTRUCTION     capsulotomy with vaginal laser     cataract surgery     CHOLECYSTECTOMY     COLONOSCOPY     DILATION AND CURETTAGE OF UTERUS     MASTECTOMY     TONSILLECTOMY      Social History: Social History   Tobacco Use   Smoking status: Never   Smokeless tobacco: Never  Vaping Use   Vaping status: Never Used  Substance Use Topics  Alcohol use: No   Drug use: No   Allergies  Allergen Reactions   Azithromycin Hives   Dalmane [Flurazepam] Other (See Comments)    unknown   Demerol [Meperidine] Other (See Comments)    Unknown    Felodipine Other (See Comments)    unknown   Fosamax [Alendronate Sodium] Other (See Comments)    unknown   Hctz [Hydrochlorothiazide] Other (See Comments)    Unknown    Ivp Dye [Iodinated Contrast Media] Other (See Comments)    unknown   Keflin [Cephalothin] Other (See Comments)    unknown    Macrodantin [Nitrofurantoin Macrocrystal] Other (See Comments)    unknown   Morphine And Codeine Other (See Comments)    unknown   Phenergan [Promethazine Hcl] Other (See Comments)    unknown   Valium [Diazepam] Other (See Comments)    unknown   Doxycycline  Rash    headache   Keflex [Cephalexin] Rash   Neosporin [Neomycin-Bacitracin Zn-Polymyx] Rash   Penicillins Rash    History reviewed. No pertinent family history.   Prior to Admission medications   Medication Sig Start Date End Date Taking? Authorizing Provider  apixaban  (ELIQUIS ) 5 MG TABS tablet Take 1 tablet (5 mg total) by mouth 2 (two) times daily. 12/28/22   Terra Fairy PARAS, PA-C  Cholecalciferol 50 MCG (2000 UT) CAPS Take 2,000 Units by mouth daily after supper.    [provider]  citalopram  (CELEXA ) 40 MG tablet Take 20 mg by mouth daily after supper.    [provider]  diclofenac  Sodium (VOLTAREN ) 1 % GEL Apply 4 g topically 4 (four) times daily. Apply to right knee 08/18/23   Cindy Garnette POUR, MD  doxycycline  (VIBRAMYCIN ) 100 MG capsule Take 1 capsule (100 mg total) by mouth 2 (two) times daily. 09/21/23   Sofia, Leslie K, PA-C  hydrocortisone  1 % ointment Apply topically 2 (two) times daily. Apply to itchy skin 08/18/23   Cindy Garnette POUR, MD  loratadine  (CLARITIN ) 10 MG tablet Take 10 mg by mouth daily after supper.    [provider]  metoprolol  succinate (TOPROL -XL) 25 MG 24 hr tablet Take 0.5 tablets (12.5 mg total) by mouth daily. 08/19/23 09/18/23  Cindy Garnette POUR, MD  polyethylene glycol (MIRALAX  / GLYCOLAX ) 17 g packet Take 17 g by mouth daily as needed for mild constipation. 08/18/23   Cindy Garnette POUR, MD  Probiotic Product (PROBIOTIC DAILY PO) Take 1 capsule by mouth daily after supper. Culturelle    [provider]  silver  sulfADIAZINE  (SILVADENE ) 1 % cream Apply topically daily. Apply to breast wound 08/19/23   Cindy Garnette POUR, MD    Physical Exam: Vitals:   01/22/24 2000  01/22/24 2115 01/22/24 2130 01/23/24 0030  BP:  121/71 133/74   Pulse: (!) 103 95 (!) 104 (!) 107  Resp: 16 (!) 28 18 14   Temp:   99.5 F (37.5 C)   TempSrc:   Rectal   SpO2: 96% 96% 98% 90%  Weight:      Height:       Constitutional: Elderly woman resting supine in bed.  NAD, calm, comfortable Eyes: EOMI, lids and conjunctivae normal ENMT: Mucous membranes are moist. Posterior pharynx clear of any exudate or lesions.Normal dentition.  Neck: normal, supple, no masses. Respiratory: clear to auscultation bilaterally, no wheezing, no crackles. Normal respiratory effort. No accessory muscle use.  Cardiovascular: Irregularly irregular, no murmurs / rubs / gallops. No extremity edema. 2+ pedal pulses. Abdomen: Soft, no tenderness, no masses  palpated. Musculoskeletal: no clubbing / cyanosis. No joint deformity upper and lower extremities. Good ROM, no contractures. Normal muscle tone.  Skin: S/p bilateral breast implantation.  Several superficial ulcerated wounds on both breasts with surrounding erythema.  No active discharge. Neurologic: Sensation intact. Strength 5/5 in all 4.  Psychiatric: Alert and oriented x 3. Normal mood.   EKG: Not performed.  Assessment/Plan Principal Problem:   Cellulitis of breast Active Problems:   Persistent atrial fibrillation (HCC)   Depression with anxiety   Chronic kidney disease, stage 3a (HCC)   Bacteriuria   Fall at home, initial encounter   Dawn Roy is a 89 y.o. 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  Assessment and Plan: Recurrent cellulitis of bilateral breasts: Recurrent issue due to frequent picking.  Leukocytosis noted otherwise  hemodynamically stable.  Not improving with outpatient antibiotics including ciprofloxacin , doxycycline , Bactrim . - Continue IV vancomycin  and ceftriaxone  - Consult to wound care  Fall at home: History consistent with mechanical fall.  No significant injury identified.  She did not lose consciousness or hit her head.  Continue fall precautions, PT/OT eval.  Persistent atrial fibrillation: Chronic and stable.  Continue metoprolol  and Eliquis .  CKD stage IIIa: Creatinine slightly increased from previous baseline.  She received IV fluids in the ED.  Repeat labs in AM.  Asymptomatic bacteriuria: Patient denies urinary symptoms.  Does not require specific treatment although already receiving antibiotics as above.  Depression/anxiety: Continue citalopram .  Normocytic anemia: Hemoglobin stable at 10.6.   DVT prophylaxis:  apixaban  (ELIQUIS ) tablet 5 mg  Code Status:   Code Status: Limited: Do not attempt resuscitation (DNR) -DNR-LIMITED -Do Not Intubate/DNI confirmed with family on admission, MOST form reviewed. Family Communication: Daughter and granddaughter at bedside Disposition Plan: From home, dispo pending clinical progress Consults called: None Severity of Illness: The appropriate patient status for this patient is OBSERVATION. Observation status is judged to be reasonable and necessary in order to provide the required intensity of service to ensure the patient's safety. The patient's presenting symptoms, physical exam findings, and initial radiographic and laboratory data in the context of their medical condition is felt to place them at decreased risk for further clinical deterioration. Furthermore, it is anticipated that the patient will be medically stable for discharge from the hospital within 2 midnights of admission.   Jorie Blanch MD [facility]  If 7PM-7AM, please contact night-coverage [email or web address]  01/23/2024, 1:13 AM

## 2024-01-24 DIAGNOSIS — Y92009 Unspecified place in unspecified non-institutional (private) residence as the place of occurrence of the external cause: Secondary | ICD-10-CM | POA: Diagnosis not present

## 2024-01-24 DIAGNOSIS — Z853 Personal history of malignant neoplasm of breast: Secondary | ICD-10-CM | POA: Diagnosis not present

## 2024-01-24 DIAGNOSIS — I4819 Other persistent atrial fibrillation: Secondary | ICD-10-CM | POA: Diagnosis not present

## 2024-01-24 DIAGNOSIS — F418 Other specified anxiety disorders: Secondary | ICD-10-CM

## 2024-01-24 DIAGNOSIS — I4821 Permanent atrial fibrillation: Secondary | ICD-10-CM | POA: Diagnosis present

## 2024-01-24 DIAGNOSIS — F419 Anxiety disorder, unspecified: Secondary | ICD-10-CM | POA: Diagnosis present

## 2024-01-24 DIAGNOSIS — W19XXXA Unspecified fall, initial encounter: Secondary | ICD-10-CM | POA: Diagnosis not present

## 2024-01-24 DIAGNOSIS — M25552 Pain in left hip: Secondary | ICD-10-CM | POA: Diagnosis present

## 2024-01-24 DIAGNOSIS — Z880 Allergy status to penicillin: Secondary | ICD-10-CM | POA: Diagnosis not present

## 2024-01-24 DIAGNOSIS — Z7901 Long term (current) use of anticoagulants: Secondary | ICD-10-CM | POA: Diagnosis not present

## 2024-01-24 DIAGNOSIS — Z66 Do not resuscitate: Secondary | ICD-10-CM | POA: Diagnosis present

## 2024-01-24 DIAGNOSIS — Z85828 Personal history of other malignant neoplasm of skin: Secondary | ICD-10-CM | POA: Diagnosis not present

## 2024-01-24 DIAGNOSIS — Z888 Allergy status to other drugs, medicaments and biological substances status: Secondary | ICD-10-CM | POA: Diagnosis not present

## 2024-01-24 DIAGNOSIS — Z9013 Acquired absence of bilateral breasts and nipples: Secondary | ICD-10-CM | POA: Diagnosis not present

## 2024-01-24 DIAGNOSIS — N611 Abscess of the breast and nipple: Secondary | ICD-10-CM | POA: Diagnosis present

## 2024-01-24 DIAGNOSIS — Z79899 Other long term (current) drug therapy: Secondary | ICD-10-CM | POA: Diagnosis not present

## 2024-01-24 DIAGNOSIS — Y812 Prosthetic and other implants, materials and accessory general- and plastic-surgery devices associated with adverse incidents: Secondary | ICD-10-CM | POA: Diagnosis present

## 2024-01-24 DIAGNOSIS — M810 Age-related osteoporosis without current pathological fracture: Secondary | ICD-10-CM | POA: Diagnosis present

## 2024-01-24 DIAGNOSIS — D631 Anemia in chronic kidney disease: Secondary | ICD-10-CM | POA: Diagnosis present

## 2024-01-24 DIAGNOSIS — I129 Hypertensive chronic kidney disease with stage 1 through stage 4 chronic kidney disease, or unspecified chronic kidney disease: Secondary | ICD-10-CM | POA: Diagnosis present

## 2024-01-24 DIAGNOSIS — S21002A Unspecified open wound of left breast, initial encounter: Secondary | ICD-10-CM | POA: Diagnosis present

## 2024-01-24 DIAGNOSIS — L0390 Cellulitis, unspecified: Secondary | ICD-10-CM | POA: Diagnosis present

## 2024-01-24 DIAGNOSIS — R8271 Bacteriuria: Secondary | ICD-10-CM | POA: Diagnosis present

## 2024-01-24 DIAGNOSIS — N61 Mastitis without abscess: Secondary | ICD-10-CM | POA: Diagnosis present

## 2024-01-24 DIAGNOSIS — T8549XA Other mechanical complication of breast prosthesis and implant, initial encounter: Secondary | ICD-10-CM | POA: Diagnosis present

## 2024-01-24 DIAGNOSIS — Z90710 Acquired absence of both cervix and uterus: Secondary | ICD-10-CM | POA: Diagnosis not present

## 2024-01-24 DIAGNOSIS — F32A Depression, unspecified: Secondary | ICD-10-CM | POA: Diagnosis present

## 2024-01-24 DIAGNOSIS — Z881 Allergy status to other antibiotic agents status: Secondary | ICD-10-CM | POA: Diagnosis not present

## 2024-01-24 DIAGNOSIS — W010XXA Fall on same level from slipping, tripping and stumbling without subsequent striking against object, initial encounter: Secondary | ICD-10-CM | POA: Diagnosis present

## 2024-01-24 DIAGNOSIS — Z91041 Radiographic dye allergy status: Secondary | ICD-10-CM | POA: Diagnosis not present

## 2024-01-24 DIAGNOSIS — N1831 Chronic kidney disease, stage 3a: Secondary | ICD-10-CM

## 2024-01-24 LAB — BLOOD CULTURE ID PANEL (REFLEXED) - BCID2

## 2024-01-24 MED ORDER — MELATONIN 3 MG PO TABS
3.0000 mg | ORAL_TABLET | Freq: Every evening | ORAL | Status: DC | PRN
Start: 2024-01-24 — End: 2024-01-28
  Administered 2024-01-24 – 2024-01-27 (×5): 3 mg via ORAL
  Filled 2024-01-24 (×5): qty 1

## 2024-01-24 NOTE — Progress Notes (Signed)
 [facility]                                                                               Dawn Roy, is a 89 y.o. female, DOB - 1929-05-31, FMW:[id number] Admit date - 01/22/2024    Outpatient Primary MD for the patient is Varadarajan, Rupashree, MD  LOS - 0  days    Brief summary   Dawn Roy is a 89 y.o. 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   Assessment & Plan    Assessment and Plan:  Recurrent cellulitis of bilateral breasts with open wounds.  They have tried outpatient antibiotics and failed .  She was started on broad spectrum IV antibiotics, IV vancomycin  and IV ceftriaxone , the erythema and tenderness have improved. Recommend to continue the same antibiotics for another 24 hours and transition to oral in am.  Pain control. Leukocytosis is improving.  Wound care consulted and recommendations given.     Fall at home.  Consistent with mechanical fall.  PT/OT evaluations ordered    Persistent Atrial fibrillation Rate controlled.  Resume Eliquis  and metoprolol .    Asymptomatic bacteriuria No urinary symptoms.     Anemia of chronic disease Hemoglobin stable around 10.    Depression / anxiety Resume home meds.   Stage 3a CKD Creatinine ranging between 1 to 1.4.  Creatinine at 1.2 and stable today.    Of note patient is active with home hospice.   Estimated body mass index is 32.92 kg/m as calculated from the following:   Height as of this encounter: 5' 2 (1.575 m).   Weight as of this encounter: 81.6 kg.  Code Status: DNR limited.  DVT Prophylaxis:   apixaban  (ELIQUIS ) tablet 5 mg   Level of Care: Level of care:  Telemetry Family Communication: none at bedside.   Disposition Plan:     Remains inpatient appropriate:  pending improvement in cellulitis.   Procedures:  None.   Consultants:   None.   Antimicrobials:   Anti-infectives (From admission, onward)    Start     Dose/Rate Route Frequency Ordered Stop   01/24/24 0600  vancomycin  (VANCOREADY) IVPB 500 mg/100 mL        500 mg 100 mL/hr over 60 Minutes Intravenous Every 24 hours 01/23/24 0118     01/23/24 0115  cefTRIAXone  (ROCEPHIN ) 2 g in sodium chloride  0.9 % 100 mL IVPB        2 g 200 mL/hr over 30 Minutes Intravenous Daily at bedtime 01/23/24 0059     01/22/24 2330  vancomycin  (VANCOREADY) IVPB 1500 mg/300 mL        1,500 mg 150 mL/hr over 120 Minutes Intravenous  Once 01/22/24 2322 01/23/24 0246        Medications  Scheduled Meds:  apixaban   5 mg Oral BID   citalopram   20 mg Oral QPC supper   collagenase   Topical Daily   diclofenac  Sodium  4 g  Topical TID AC & HS   metoprolol  succinate  25 mg Oral Daily   Continuous Infusions:  cefTRIAXone  (ROCEPHIN )  IV Stopped (01/23/24 2149)   vancomycin  500 mg (01/24/24 0510)   PRN Meds:.acetaminophen  **OR** acetaminophen , bisacodyl, melatonin, ondansetron  **OR** ondansetron  (ZOFRAN ) IV, oxyCODONE , senna-docusate    Subjective:   Dawn Roy was seen and examined today. Pleasantly confused. No pain or any complaints.  Objective:   Vitals:   01/24/24 0013 01/24/24 0444 01/24/24 0445 01/24/24 0819  BP: (!) 116/57 135/80 135/80 121/65  Pulse: 100 (!) 106 89 96  Resp:    18  Temp: 98.7 F (37.1 C) 98.2 F (36.8 C) 98.2 F (36.8 C) (!) 97.5 F (36.4 C)  TempSrc:      SpO2: 94% 95% 95% 96%  Weight:      Height:        Intake/Output Summary (Last 24 hours) at 01/24/2024 0914 Last data filed at 01/24/2024 0553 Gross per 24 hour  Intake 195.21 ml  Output 800 ml  Net -604.79 ml   Filed Weights   01/22/24 1803  Weight: 81.6 kg     Exam General exam:Elderly  lady, not in distress.  Respiratory system: Clear to auscultation. Respiratory effort normal. Cardiovascular system: S1 & S2 heard, irregularly irregular.  Gastrointestinal system: Abdomen is soft bs+ Central nervous system: Alert and oriented to person on ly.  Extremities: no pedal edema.  Skin: erythema, tenderness improving on the breasts.  Psychiatry: pleasantly confused.    Data Reviewed:  I have personally reviewed following labs and imaging studies   CBC Lab Results  Component Value Date   WBC 12.4 (H) 01/23/2024   RBC 3.31 (L) 01/23/2024   HGB 9.7 (L) 01/23/2024   HCT 31.5 (L) 01/23/2024   MCV 95.2 01/23/2024   MCH 29.3 01/23/2024   PLT 292 01/23/2024   MCHC 30.8 01/23/2024   RDW 13.5 01/23/2024   LYMPHSABS 1.4 01/22/2024   MONOABS 1.2 (H) 01/22/2024   EOSABS 0.1 01/22/2024   BASOSABS 0.0 01/22/2024     Last metabolic panel Lab Results  Component Value Date   NA 134 (L) 01/23/2024   K 3.8 01/23/2024   CL 97 (L) 01/23/2024   CO2 27 01/23/2024   BUN 17 01/23/2024   CREATININE 1.26 (H) 01/23/2024   GLUCOSE 129 (H) 01/23/2024   GFRNONAA 40 (L) 01/23/2024   CALCIUM 8.1 (L) 01/23/2024   PHOS 3.0 08/09/2023   PROT 6.0 (L) 01/22/2024   ALBUMIN 2.6 (L) 01/22/2024   BILITOT 0.8 01/22/2024   ALKPHOS 60 01/22/2024   AST 21 01/22/2024   ALT 11 01/22/2024   ANIONGAP 10 01/23/2024    CBG (last 3)  No results for input(s): GLUCAP in the last 72 hours.    Coagulation Profile: No results for input(s): INR, PROTIME in the last 168 hours.   Radiology Studies: DG Knee Complete 4 Views Left Result Date: 01/22/2024 CLINICAL DATA:  [name], pain EXAM: LEFT KNEE - COMPLETE 4+ VIEW COMPARISON:  None Available. FINDINGS: Frontal, bilateral oblique, lateral views of the left knee are obtained. No acute fracture, subluxation, or dislocation. There is severe 3 compartmental osteoarthritis. No joint effusion. Soft tissues are unremarkable. IMPRESSION: 1. Severe 3  compartmental osteoarthritis. 2. No acute fracture. Electronically Signed   By: Ozell Daring M.D.   On: 01/22/2024 21:00   DG Hip Unilat With Pelvis 2-3 Views Left Result Date: 01/22/2024 CLINICAL DATA:  [name], left hip pain EXAM: DG HIP (WITH OR WITHOUT PELVIS) 2-3V  LEFT COMPARISON:  None Available. FINDINGS: Frontal view of the pelvis as well as frontal and frogleg lateral views of the left hip are obtained. No acute fracture, subluxation, or dislocation. Mild symmetrical bilateral hip osteoarthritis. Moderate lower lumbar spondylosis and facet hypertrophy. The sacroiliac joints are unremarkable. IMPRESSION: 1. No acute displaced fracture. 2. Degenerative changes of the lumbar spine and bilateral hips. Electronically Signed   By: Ozell Daring M.D.   On: 01/22/2024 18:48       Elgie Butter M.D. [facility] 01/24/2024, 9:14 AM  Available via Epic secure chat 7am-7pm After 7 pm, please refer to night coverage provider listed on amion.

## 2024-01-24 NOTE — Plan of Care (Signed)

## 2024-01-24 NOTE — Progress Notes (Signed)
 Transition of Care [facility]) - Inpatient Brief Assessment   Patient Details  Name: Dawn Roy MRN: [id number] Date of Birth: 09-08-1929  Transition of Care [facility]) CM/SW Contact:    Rosaline JONELLE Joe, RN Phone Number: 01/24/2024, 4:12 PM   Clinical Narrative: Transition of Care [facility]) - Inpatient Brief Assessment   Patient Details  Name: Dawn Roy MRN: [id number] Date of Birth: [birth year]  Transition of Care [facility]) CM/SW Contact:    Rosaline JONELLE Joe, RN Phone Number: 01/24/2024, 4:12 PM   Clinical Narrative: CM met with the patient's daughter and granddaughter at the bedside.  Patient lives at home with daughter and is currently active with home hospice.  The patient's daughter states that patient admitted to the hospital through [facility] as inpatient at the hospital for IV antibiotics.  Patient will return home with home hospice when stable.  DME at the home includes hospital bed bedside table, shower chair, RW, Electric WC, ramps.  Patient will need [facility] transport home when stable to return home with hospice.  Patient will return home to current address when ready.  The patient's daughter states that patient will admit to inpatient hospice facility in [place] from Friday - through Tuesday since the family is moving her home to her new home at [facility].  CM will continue to follow the patient for arrangement of transportation to home when stable by [facility] since patient is active [facility] home hospice patient.   Transition of Care Asessment: Insurance and Status: Insurance coverage has been reviewed Patient has primary care physician: Yes Home environment has been reviewed: from home with [facility]   Prior/Current Home Services: (P) Current home services (Patient is active with [facility]) Social Drivers of Health Review: (P) SDOH reviewed needs interventions Readmission risk has been reviewed: (P)  Yes Transition of care needs: (P) no transition of care needs at this time    Transition of Care Asessment: Insurance and Status: Insurance coverage has been reviewed Patient has primary care physician: Yes Home environment has been reviewed: from home with [facility]   Prior/Current Home Services: (P) Current home services (Patient is active with [facility]) Social Drivers of Health Review: (P) SDOH reviewed needs interventions Readmission risk has been reviewed: (P) Yes Transition of care needs: (P) no transition of care needs at this time

## 2024-01-24 NOTE — Plan of Care (Signed)
  Problem: Clinical Measurements: Goal: Ability to maintain clinical measurements within normal limits will improve Outcome: Progressing Goal: Will remain free from infection Outcome: Progressing Goal: Diagnostic test results will improve Outcome: Progressing Goal: Respiratory complications will improve Outcome: Progressing Goal: Cardiovascular complication will be avoided Outcome: Progressing   Problem: Activity: Goal: Risk for activity intolerance will decrease Outcome: Progressing   Problem: Nutrition: Goal: Adequate nutrition will be maintained Outcome: Progressing   Problem: Education: Goal: Knowledge of General Education information will improve Description: Including pain rating scale, medication(s)/side effects and non-pharmacologic comfort measures Outcome: Not Progressing   Problem: Health Behavior/Discharge Planning: Goal: Ability to manage health-related needs will improve Outcome: Not Progressing

## 2024-01-24 NOTE — Evaluation (Signed)
 Occupational Therapy Evaluation Patient Details Name: Dawn Roy MRN: [id number] DOB: 09/05/1929 Today's Date: 01/24/2024   History of Present Illness   Pt is a 89 y.o. female who presented 01/22/24 s/p fall. Pt also found to have cellulitis of bil breasts. 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 Pt presents now with deficits in cognition, balance, endurance and strength. Pt requires Min-Mod A for bed mobility, unable to clear bottom from bed to stand with +1 assist this AM. Pt able to assist with EOB ADLs w/ Min A for UB ADL and up to Max-Total A for LB ADLs. Noted daughter present with PT eval yesterday w/ plans to return home upon DC.     If plan is discharge home, recommend the following:   A lot of help with walking and/or transfers;Two people to help with walking and/or transfers;A lot of help with bathing/dressing/bathroom     Functional Status Assessment   Patient has had a recent decline in their functional status and demonstrates the ability to make significant improvements in function in a reasonable and predictable amount of time.     Equipment Recommendations   None recommended by OT     Recommendations for Other Services         Precautions/Restrictions   Precautions Precautions: Fall Recall of Precautions/Restrictions: Impaired Restrictions Weight Bearing Restrictions Per Provider Order: No     Mobility Bed Mobility Overal bed mobility: Needs Assistance Bed Mobility: Sit to Supine, Supine to Sit     Supine to sit: Mod assist, HOB elevated, Used  rails Sit to supine: Min assist   General bed mobility comments: assist to scoot hips to EOB using bed pad. Min A for BLE back to bed. Total A to scoot up in bed d/t pt difficulty assisting with task    Transfers Overall transfer level: Needs assistance Equipment used: Rolling walker (2 wheels)               General transfer comment: attempted to stand from bedside with RW though pt unable to clear bottom w/ +1 assist      Balance Overall balance assessment: Needs assistance Sitting-balance support: No upper extremity supported, Feet supported Sitting balance-Leahy Scale: Fair                                     ADL either performed or assessed with clinical judgement   ADL Overall ADL's : Needs assistance/impaired Eating/Feeding: Set up;Sitting;Minimal assistance Eating/Feeding Details (indicate cue type and reason): per nursing, pt with difficulty following commands to open mouth to eat this AM Grooming: Minimal assistance;Sitting;Supervision/safety;Wash/dry face;Brushing hair Grooming Details (indicate cue type and reason): cues to initiate with pt then able to complete task. did need cues to open eyes and reach for comb Upper Body Bathing: Minimal assistance;Sitting Upper Body Bathing Details (indicate cue type and reason): able to bathe anterior UB with cues, assist for back Lower Body Bathing: Sitting/lateral leans;Bed level;Maximal assistance   Upper Body Dressing : Sitting;Minimal assistance   Lower Body Dressing: Total assistance;Sitting/lateral leans;Bed level       Toileting- Clothing Manipulation and  Hygiene: Total assistance;Bed level               Vision Ability to See in Adequate Light: 0 Adequate Patient Visual Report: No change from baseline Vision Assessment?: No apparent visual deficits     Perception         Praxis         Pertinent Vitals/Pain Pain Assessment Pain Assessment: Faces Faces Pain Scale: Hurts a little  bit Pain Location: generalized with bed mobility (unable to specify location) Pain Descriptors / Indicators: Grimacing Pain Intervention(s): Monitored during session, Limited activity within patient's tolerance     Extremity/Trunk Assessment Upper Extremity Assessment Upper Extremity Assessment: Generalized weakness;Right hand dominant   Lower Extremity Assessment Lower Extremity Assessment: Defer to PT evaluation       Communication Communication Communication: No apparent difficulties Factors Affecting Communication: Hearing impaired   Cognition Arousal: Alert Behavior During Therapy: WFL for tasks assessed/performed Cognition: Cognition impaired   Orientation impairments: Time, Situation Awareness: Intellectual awareness impaired, Online awareness impaired Memory impairment (select all impairments): Short-term memory, Working civil service fast streamer, Conservation officer, historic buildings Attention impairment (select first level of impairment): Sustained attention Executive functioning impairment (select all impairments): Organization, Problem solving OT - Cognition Comments: pleasant, repeats same statements Thank you, honey and You're so sweet. follows commands with multimodal cues, poor recall of recent PLOF but able to recall time living in [place] in the past. Cues for attention to tasks and problem solving but fair carryover. Per PT eval yesterday with daughter present, daughter feels cognitive issues also due to current infection                 Following commands: Impaired Following commands impaired: Follows one step commands with increased time, Follows multi-step commands inconsistently     Cueing  General Comments   Cueing Techniques: Verbal cues;Tactile cues      Exercises     Shoulder Instructions      Home Living Family/patient expects to be discharged to:: Private residence Living Arrangements: Children Available Help at Discharge: Family;Available 24  hours/day Type of Home: House Home Access: Ramped entrance     Home Layout: One level     Bathroom Shower/Tub: Producer, Television/film/video: Handicapped height Bathroom Accessibility: Yes   Home Equipment: Grab bars - tub/shower;Grab bars - toilet;Rolling Walker (2 wheels);Wheelchair - power;Shower seat;Lift chair;Toilet riser;BSC/3in1;Hospital bed          Prior Functioning/Environment Prior Level of Function : Needs assist;History of Falls (last six months);Patient poor historian/Family not available             Mobility Comments: Mod I using RW to mobilize in house except needs assist on x1 stair in house; walks up to ~50 ft before she fatigues ADLs Comments: wears adult briefs, assist for bathing, needs assistance to get into and out of shower, family does IADLs, needs assistance for dressing    OT Problem List: Decreased strength;Impaired balance (sitting and/or standing);Decreased activity tolerance;Decreased cognition;Decreased safety awareness;Decreased knowledge of use of DME or AE   OT Treatment/Interventions: Self-care/ADL training;Therapeutic exercise;Energy conservation;DME and/or AE instruction;Therapeutic activities;Balance training;Patient/family education      OT Goals(Current goals can be found in the care plan section)   Acute Rehab OT Goals Patient Stated Goal: agreeable for bedside ADLs OT Goal Formulation: Patient unable to participate in goal setting Time For Goal Achievement: 02/08/24 Potential to Achieve Goals: Fair   OT Frequency:  Min 2X/week    Co-evaluation  AM-PAC OT 6 Clicks Daily Activity     Outcome Measure Help from another person eating meals?: A Little Help from another person taking care of personal grooming?: A Little Help from another person toileting, which includes using toliet, bedpan, or urinal?: Total Help from another person bathing (including washing, rinsing, drying)?: A Lot Help from another  person to put on and taking off regular upper body clothing?: A Little Help from another person to put on and taking off regular lower body clothing?: Total 6 Click Score: 13   End of Session Equipment Utilized During Treatment: Rolling walker (2 wheels) Nurse Communication: Mobility status  Activity Tolerance: Patient tolerated treatment well;Patient limited by fatigue Patient left: in bed;with call bell/phone within reach;with bed alarm set;Other (comment) (mitts reapplied, MD at bedside)  OT Visit Diagnosis: Unsteadiness on feet (R26.81);Other abnormalities of gait and mobility (R26.89);Muscle weakness (generalized) (M62.81);Other symptoms and signs involving cognitive function                Time: 9160-9098 OT Time Calculation (min): 22 min Charges:  OT General Charges $OT Visit: 1 Visit OT Evaluation $OT Eval Low Complexity: 1 Low  Dawn Roy, OTR/L Acute Rehab Services Office: [phone]   Dawn Fish 01/24/2024, 9:14 AM

## 2024-01-24 NOTE — Progress Notes (Signed)
 [id number] [facility] Liaison Note Dawn Roy is a current hospice patient with a terminal diagnosis of Moderate Protein Calorie Malnutrition. Patient family contacted [facility] and reported that patient had a fall in the bathroom and had been on the floor for about an hour and was complaining of left hip pain. EMS was activated and patient was transported to hospital. She was admitted to [facility] on 11.16.25 for Recurrent cellulitis of bilateral breasts with open wounds requiring IV antibiotics. Per Dr. Deane Finder with [facility], this is a related hospital admission. Patient is a DNR. Met with patient and family at bedside. Patient was experiencing some leg pain and was able to reposition.  Family states patient "ate like a teenage boy and was very talkative" yesterday.  Liaison encouraged them to call [facility] with any concerns. Pt is inpatient appropriate due to treatment of cellulitis requiring IV antibiotics for management.  VS: 98,2, 135/80, 89, 95% RA I/O: 195.2/800 Abnormal Labs:   Creatinine 1.26, GFR 40, WBC 12.4, Hgb 9.7, Hct 31.5  Diagnostics: No new imaging    IV/PRN meds: ROCEPHIN  2 g in sodium chloride  0.9 % 100 mL IVPB  x1, Lactated Ringers  500ml Bolus IV x1, Vancomycin  1500mg  IV x1, Tylenol  650mg  PO x1,    Assessment and Plan (per Cherlyn Labella, MD on 11.17.25)   Assessment and Plan:  Recurrent cellulitis of bilateral breasts with open wounds.  They have tried outpatient antibiotics and failed .  She was started on broad spectrum IV antibiotics, IV vancomycin  and IV ceftriaxone , the erythema and tenderness have improved. Recommend to continue the same antibiotics for another 24 hours and transition to oral in am.  Pain control. Leukocytosis is improving.  Wound care consulted and recommendations given.  Fall at home Consistent with mechanical fall.  PT/OT evaluations ordered  Persistent Atrial fibrillation Rate controlled.  Resume Eliquis   and metoprolol .  Asymptomatic bacteriuria No urinary symptoms.  Anemia of chronic disease Hemoglobin stable around 10.  Depression / anxiety Resume home meds.  Stage 3a CKD Creatinine ranging between 1 to 1.4.  Creatinine at 1.2 and stable today.   Discharge Planning: ongoing, home when medically stable Family Contact: Spoke with family at bedside  IDT: updated Goals of Care: Limited DNR Medication List and Transfer Summary uploaded to patient chart. Should patient need ambulance transport at discharge, please call [facility] as we contract with them for our active hospice patients. Please call with any hospice questions or concerns. Thank you, Inocente Jacobs, BSN, [facility] [phone]

## 2024-01-24 NOTE — Progress Notes (Signed)
 PHARMACY - PHYSICIAN COMMUNICATION CRITICAL VALUE ALERT - BLOOD CULTURE IDENTIFICATION (BCID)  Dawn Roy is an 89 y.o. female who presented to [facility] on 01/22/2024 with a chief complaint of cellulitis  Name of physician (or Provider) Contacted: Dr. Charlton  Current antibiotics: Vancomycin , Ceftriaxone   Changes to prescribed antibiotics recommended:  No changes for now  Results for orders placed or performed during the hospital encounter of 01/22/24  Blood Culture ID Panel (Reflexed) (Collected: 01/22/2024 11:39 PM)  Result Value Ref Range   Enterococcus faecalis NOT DETECTED NOT DETECTED   Enterococcus Faecium NOT DETECTED NOT DETECTED   Listeria monocytogenes NOT DETECTED NOT DETECTED   Staphylococcus species DETECTED (A) NOT DETECTED   Staphylococcus aureus (BCID) NOT DETECTED NOT DETECTED   Staphylococcus epidermidis DETECTED (A) NOT DETECTED   Staphylococcus lugdunensis NOT DETECTED NOT DETECTED   Streptococcus species NOT DETECTED NOT DETECTED   Streptococcus agalactiae NOT DETECTED NOT DETECTED   Streptococcus pneumoniae NOT DETECTED NOT DETECTED   Streptococcus pyogenes NOT DETECTED NOT DETECTED   A.calcoaceticus-baumannii NOT DETECTED NOT DETECTED   Bacteroides fragilis NOT DETECTED NOT DETECTED   Enterobacterales NOT DETECTED NOT DETECTED   Enterobacter cloacae complex NOT DETECTED NOT DETECTED   Escherichia coli NOT DETECTED NOT DETECTED   Klebsiella aerogenes NOT DETECTED NOT DETECTED   Klebsiella oxytoca NOT DETECTED NOT DETECTED   Klebsiella pneumoniae NOT DETECTED NOT DETECTED   Proteus species NOT DETECTED NOT DETECTED   Salmonella species NOT DETECTED NOT DETECTED   Serratia marcescens NOT DETECTED NOT DETECTED   Haemophilus influenzae NOT DETECTED NOT DETECTED   Neisseria meningitidis NOT DETECTED NOT DETECTED   Pseudomonas aeruginosa NOT DETECTED NOT DETECTED   Stenotrophomonas maltophilia NOT DETECTED NOT DETECTED   Candida albicans NOT DETECTED  NOT DETECTED   Candida auris NOT DETECTED NOT DETECTED   Candida glabrata NOT DETECTED NOT DETECTED   Candida krusei NOT DETECTED NOT DETECTED   Candida parapsilosis NOT DETECTED NOT DETECTED   Candida tropicalis NOT DETECTED NOT DETECTED   Cryptococcus neoformans/gattii NOT DETECTED NOT DETECTED   Methicillin resistance mecA/C DETECTED (A) NOT DETECTED    [name] 01/24/2024  3:50 AM

## 2024-01-25 ENCOUNTER — Inpatient Hospital Stay (HOSPITAL_COMMUNITY)

## 2024-01-25 LAB — CBC WITH DIFFERENTIAL/PLATELET
Abs Immature Granulocytes: 0.07 K/uL (ref 0.00–0.07)
Basophils Absolute: 0 K/uL (ref 0.0–0.1)
Basophils Relative: 0 %
Eosinophils Absolute: 0.5 K/uL (ref 0.0–0.5)
Eosinophils Relative: 7 %
HCT: 31.5 % — ABNORMAL LOW (ref 36.0–46.0)
Hemoglobin: 9.9 g/dL — ABNORMAL LOW (ref 12.0–15.0)
Immature Granulocytes: 1 %
Lymphocytes Relative: 19 %
Lymphs Abs: 1.4 K/uL (ref 0.7–4.0)
MCH: 29.4 pg (ref 26.0–34.0)
MCHC: 31.4 g/dL (ref 30.0–36.0)
MCV: 93.5 fL (ref 80.0–100.0)
Monocytes Absolute: 0.7 K/uL (ref 0.1–1.0)
Monocytes Relative: 10 %
Neutro Abs: 4.5 K/uL (ref 1.7–7.7)
Neutrophils Relative %: 63 %
Platelets: 300 K/uL (ref 150–400)
RBC: 3.37 MIL/uL — ABNORMAL LOW (ref 3.87–5.11)
RDW: 13.4 % (ref 11.5–15.5)
WBC: 7.1 K/uL (ref 4.0–10.5)
nRBC: 0 % (ref 0.0–0.2)

## 2024-01-25 LAB — CULTURE, BLOOD (ROUTINE X 2): Special Requests: ADEQUATE

## 2024-01-25 LAB — BASIC METABOLIC PANEL WITH GFR
Anion gap: 11 (ref 5–15)
BUN: 14 mg/dL (ref 8–23)
CO2: 29 mmol/L (ref 22–32)
Calcium: 8.6 mg/dL — ABNORMAL LOW (ref 8.9–10.3)
Chloride: 95 mmol/L — ABNORMAL LOW (ref 98–111)
Creatinine, Ser: 0.73 mg/dL (ref 0.44–1.00)
GFR, Estimated: >60 mL/min (ref >60)
Glucose, Bld: 108 mg/dL — ABNORMAL HIGH (ref 70–99)
Potassium: 4.3 mmol/L (ref 3.5–5.1)
Sodium: 135 mmol/L (ref 135–145)

## 2024-01-25 NOTE — Progress Notes (Signed)
 [id number] [facility] Liaison Note  Dawn Roy is a current hospice patient with a terminal diagnosis of Moderate Protein Calorie Malnutrition. Patient family contacted [facility] and reported that patient had a fall in the bathroom and had been on the floor for about an hour and was complaining of left hip pain. EMS was activated and patient was transported to hospital. She was admitted to [facility] on 11.16.25 for Recurrent cellulitis of bilateral breasts with open wounds requiring IV antibiotics. Per Dr. Deane Finder with [facility], this is a related hospital admission. Patient is a DNR.  Met with patient and family at bedside. She had just returned from ultrasound.  Continued testing for area that is not improving.  Family has no concerns at this time.  Pt is inpatient appropriate due to treatment of cellulitis requiring IV antibiotics for management.   VS: 98,3, 136/117, 86, 96% RA I/O: 536/900 Abnormal Labs:  Chloride 95, Calcium 8.6, RBC 3.37, Hgb 9.9, Hct 31.5  Diagnostics: Ultrasound   IV/PRN meds: ROCEPHIN  2 g in sodium chloride  0.9 % 100 mL IVPB  x1,  Vancomycin  500mg  IV x1, Tylenol  650mg  PO x1, Oxycodone  5mg  PO x2   Assessment and Plan (per Cherlyn Labella, MD on 11.18.25)   Recurrent cellulitis of bilateral breasts with open wounds.  They have tried outpatient antibiotics and failed .  She was started on broad spectrum IV antibiotics, IV vancomycin  and IV ceftriaxone , the erythema and tenderness have improved. But she continues to have pus draining from the left breast.  US  of the breasts ordered for evaluation of abscess.  Pain control.  Leukocytosis is improving. Repeat labs today.  Wound care consulted and recommendations given.    Fall at home.  Consistent with mechanical fall.  PT/OT evaluations ordered    Persistent Atrial fibrillation Rate controlled.  Resume Eliquis  and metoprolol .     Asymptomatic bacteriuria No urinary  symptoms.    Anemia of chronic disease Hemoglobin stable around 10.    Depression / anxiety Resume home meds.  Stage 3a CKD Creatinine ranging between 1 to 1.4.  Creatinine at 1.2 and stable today.  Repeat labs today.    Discharge Planning: ongoing, home when medically stable Family Contact: Spoke with family at bedside  IDT: updated Goals of Care: Limited DNR  Should patient need ambulance transport at discharge, please call [facility] as we contract with them for our active hospice patients.  Please call with any hospice questions or concerns.  Thank you, Inocente Jacobs, BSN, [facility] [phone]

## 2024-01-25 NOTE — Progress Notes (Signed)
 [facility]                                                                               Dawn Roy, is a 89 y.o. female, DOB - [birth year], FMW:[id number] Admit date - 01/22/2024    Outpatient Primary MD for the patient is Varadarajan, Rupashree, MD  LOS - 1  days    Brief summary   Dawn Roy is a 89 y.o. 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   Assessment & Plan    Assessment and Plan:  Recurrent cellulitis of bilateral breasts with open wounds.  They have tried outpatient antibiotics and failed .  She was started on broad spectrum IV antibiotics, IV vancomycin  and IV ceftriaxone , the erythema and tenderness have improved. But she continues to have pus draining from the left breast.  US  of the breasts ordered for evaluation of abscess.  Pain control.  Leukocytosis is improving. Repeat labs today.  Wound care consulted and recommendations given.     Fall at home.  Consistent with mechanical fall.  PT/OT evaluations ordered    Persistent Atrial fibrillation Rate controlled.  Resume Eliquis  and metoprolol .    Asymptomatic bacteriuria No urinary symptoms.     Anemia of chronic disease Hemoglobin stable around 10.    Depression / anxiety Resume home meds.   Stage 3a CKD Creatinine ranging between 1 to 1.4.  Creatinine at 1.2 and stable today.  Repeat labs today.    Of note patient is active with home hospice.   Estimated body mass index is 32.92 kg/m as calculated from the following:   Height as of this encounter: 5' 2 (1.575 m).   Weight as of this encounter: 81.6 kg.  Code Status: DNR limited.  DVT Prophylaxis:    apixaban  (ELIQUIS ) tablet 5 mg   Level of Care: Level of care: Telemetry Family Communication: discussed the plan with family at bedside.   Disposition Plan:     Remains inpatient appropriate:  pending improvement in cellulitis.   Procedures:  None.   Consultants:   Wound care.   Antimicrobials:   Anti-infectives (From admission, onward)    Start     Dose/Rate Route Frequency Ordered Stop   01/24/24 0600  vancomycin  (VANCOREADY) IVPB 500 mg/100 mL        500 mg 100 mL/hr over 60 Minutes Intravenous Every 24 hours 01/23/24 0118     01/23/24 0115  cefTRIAXone  (ROCEPHIN ) 2 g in sodium chloride  0.9 % 100 mL IVPB        2 g 200 mL/hr over 30 Minutes Intravenous Daily at bedtime 01/23/24 0059     01/22/24 2330  vancomycin  (VANCOREADY) IVPB 1500 mg/300 mL        1,500 mg 150 mL/hr over 120 Minutes Intravenous  Once 01/22/24 2322 01/23/24 0246        Medications  Scheduled Meds:  apixaban   5 mg Oral BID   citalopram   20  mg Oral QPC supper   collagenase   Topical Daily   diclofenac  Sodium  4 g Topical TID AC & HS   metoprolol  succinate  25 mg Oral Daily   Continuous Infusions:  cefTRIAXone  (ROCEPHIN )  IV 2 g (01/24/24 2241)   vancomycin  500 mg (01/25/24 0526)   PRN Meds:.acetaminophen  **OR** acetaminophen , bisacodyl, melatonin, ondansetron  **OR** ondansetron  (ZOFRAN ) IV, oxyCODONE , senna-docusate    Subjective:   Dawn Roy was seen and examined today. Pleasantly confused. Denies any pain in the left breast.  Objective:   Vitals:   01/24/24 2001 01/25/24 0019 01/25/24 0526 01/25/24 0753  BP: (!) 118/58 (!) 112/53 (!) 104/52 (!) 136/117  Pulse: 70 (!) 56 84 86  Resp:  18  20  Temp: 97.8 F (36.6 C) 98 F (36.7 C) 98 F (36.7 C) 98.3 F (36.8 C)  TempSrc:  Oral Oral   SpO2: 100% 95% 94% 96%  Weight:      Height:        Intake/Output Summary (Last 24 hours) at 01/25/2024 1144 Last data filed at 01/25/2024 0019 Gross per 24 hour  Intake 418 ml   Output 900 ml  Net -482 ml   Filed Weights   01/22/24 1803  Weight: 81.6 kg     Exam General exam: Appears calm and comfortable  Respiratory system: Clear to auscultation. Respiratory effort normal. Cardiovascular system: S1 & S2 heard, irregularly irregular. No JVD, no pedal edema.  Gastrointestinal system: Abdomen is nondistended, soft and nontender.  Central nervous system: Alert and oriented to person only, pleasantly confused.  Extremities: NO PEDAL edema.  Skin: erythema has improved but she continues to have pus draining from open wound on the left breast.  Psychiatry: normal affect.     Data Reviewed:  I have personally reviewed following labs and imaging studies   CBC Lab Results  Component Value Date   WBC 12.4 (H) 01/23/2024   RBC 3.31 (L) 01/23/2024   HGB 9.7 (L) 01/23/2024   HCT 31.5 (L) 01/23/2024   MCV 95.2 01/23/2024   MCH 29.3 01/23/2024   PLT 292 01/23/2024   MCHC 30.8 01/23/2024   RDW 13.5 01/23/2024   LYMPHSABS 1.4 01/22/2024   MONOABS 1.2 (H) 01/22/2024   EOSABS 0.1 01/22/2024   BASOSABS 0.0 01/22/2024     Last metabolic panel Lab Results  Component Value Date   NA 134 (L) 01/23/2024   K 3.8 01/23/2024   CL 97 (L) 01/23/2024   CO2 27 01/23/2024   BUN 17 01/23/2024   CREATININE 1.26 (H) 01/23/2024   GLUCOSE 129 (H) 01/23/2024   GFRNONAA 40 (L) 01/23/2024   CALCIUM 8.1 (L) 01/23/2024   PHOS 3.0 08/09/2023   PROT 6.0 (L) 01/22/2024   ALBUMIN 2.6 (L) 01/22/2024   BILITOT 0.8 01/22/2024   ALKPHOS 60 01/22/2024   AST 21 01/22/2024   ALT 11 01/22/2024   ANIONGAP 10 01/23/2024    CBG (last 3)  No results for input(s): GLUCAP in the last 72 hours.    Coagulation Profile: No results for input(s): INR, PROTIME in the last 168 hours.   Radiology Studies: No results found.      Elgie Butter M.D. [facility] 01/25/2024, 11:44 AM  Available via Epic secure chat 7am-7pm After 7 pm, please refer to night coverage  provider listed on amion.

## 2024-01-25 NOTE — Plan of Care (Signed)
  Problem: Clinical Measurements: Goal: Ability to maintain clinical measurements within normal limits will improve Outcome: Progressing Goal: Will remain free from infection Outcome: Progressing Goal: Diagnostic test results will improve Outcome: Progressing   Problem: Nutrition: Goal: Adequate nutrition will be maintained Outcome: Progressing   Problem: Coping: Goal: Level of anxiety will decrease Outcome: Progressing   Problem: Education: Goal: Knowledge of General Education information will improve Description: Including pain rating scale, medication(s)/side effects and non-pharmacologic comfort measures Outcome: Not Progressing   Problem: Health Behavior/Discharge Planning: Goal: Ability to manage health-related needs will improve Outcome: Not Progressing

## 2024-01-25 NOTE — Progress Notes (Addendum)
 Physical Therapy Treatment Patient Details Name: Dawn Roy MRN: [id number] DOB: [birth year] Today's Date: 01/25/2024   History of Present Illness Pt is a 89 y.o. female who presented 01/22/24 s/p fall. Pt also found to have cellulitis of bil breasts. 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 Pt stands multiple times from bedside with assistance of PT. PT notes improvement in stand quality with use of RW on final 2 stand attempts. Pt appears anxious and reports being limited by pain in regards to standing tolerance. Pt will benefit from frequent mobilization in an effort to improve activity tolerance and to reduce risk for falls. Plan is for discharge home with hospice services when medically appropriate. PT would benefit from HHPT if aligning with goals of care.    If plan is discharge home, recommend the following: A little help with walking and/or transfers;A lot of help with bathing/dressing/bathroom;Assistance with cooking/housework;Direct supervision/assist for medications management;Direct supervision/assist for financial management;Assist for transportation;Help with stairs or ramp for entrance;Supervision due to cognitive status   Can travel by private vehicle        Equipment Recommendations  None recommended by PT    Recommendations for Other Services       Precautions / Restrictions Precautions Precautions: Fall Recall of Precautions/Restrictions: Impaired Restrictions Weight Bearing Restrictions Per Provider Order: No     Mobility  Bed Mobility Overal bed mobility: Needs Assistance Bed Mobility: Supine to Sit, Sit to Supine     Supine  to sit: Mod assist, HOB elevated, Used rails Sit to supine: Mod assist        Transfers Overall transfer level: Needs assistance Equipment used: Rolling walker (2 wheels), 1 person hand held assist Transfers: Sit to/from Stand Sit to Stand: Mod assist, Min assist           General transfer comment: modA for initial sit to stand with face to face assist of PT. Pt progresses to minA with use of RW for final 2 sit to stand attempts    Ambulation/Gait                   Stairs             Wheelchair Mobility     Tilt Bed    Modified Rankin (Stroke Patients Only)       Balance Overall balance assessment: Needs assistance Sitting-balance support: No upper extremity supported, Feet supported Sitting balance-Leahy Scale: Fair     Standing balance support: Bilateral upper extremity supported, Reliant on assistive device for balance Standing balance-Leahy Scale: Poor                              Communication Communication Communication: No apparent difficulties Factors Affecting Communication: Hearing impaired  Cognition Arousal: Alert Behavior During Therapy: Anxious   PT - Cognitive impairments: Awareness, Attention, Sequencing, Problem solving, Initiation                         Following commands: Impaired Following commands impaired: Follows one step commands with increased time, Follows multi-step commands inconsistently    Cueing Cueing Techniques: Verbal cues, Tactile cues  Exercises  General Comments General comments (skin integrity, edema, etc.): VSS on RA      Pertinent Vitals/Pain Pain Assessment Pain Assessment: Faces Faces Pain Scale: Hurts even more Pain Location: L knee Pain Descriptors / Indicators: Grimacing Pain Intervention(s): Monitored during session    Home Living                          Prior Function            PT Goals (current goals can now be found in the care plan  section) Acute Rehab PT Goals Patient Stated Goal: to improve and go home Progress towards PT goals: Progressing toward goals    Frequency    Min 2X/week      PT Plan      Co-evaluation              AM-PAC PT 6 Clicks Mobility   Outcome Measure  Help needed turning from your back to your side while in a flat bed without using bedrails?: A Lot Help needed moving from lying on your back to sitting on the side of a flat bed without using bedrails?: A Lot Help needed moving to and from a bed to a chair (including a wheelchair)?: A Little Help needed standing up from a chair using your arms (e.g., wheelchair or bedside chair)?: A Lot Help needed to walk in hospital room?: Total Help needed climbing 3-5 steps with a railing? : Total 6 Click Score: 11    End of Session Equipment Utilized During Treatment: Gait belt Activity Tolerance: Patient tolerated treatment well Patient left: in bed;with call bell/phone within reach;with bed alarm set;with family/visitor present Nurse Communication: Mobility status PT Visit Diagnosis: Unsteadiness on feet (R26.81);Other abnormalities of gait and mobility (R26.89);Muscle weakness (generalized) (M62.81);History of falling (Z91.81);Difficulty in walking, not elsewhere classified (R26.2)     Time: 8466-8398 PT Time Calculation (min) (ACUTE ONLY): 28 min  Charges:    $Therapeutic Activity: 23-37 mins PT General Charges $$ ACUTE PT VISIT: 1 Visit                     Bernardino JINNY Ruth, PT, DPT Acute Rehabilitation Office [phone]    Bernardino JINNY Ruth 01/25/2024, 4:27 PM

## 2024-01-26 DIAGNOSIS — N61 Mastitis without abscess: Secondary | ICD-10-CM | POA: Diagnosis not present

## 2024-01-26 NOTE — Plan of Care (Signed)

## 2024-01-26 NOTE — Care Management Important Message (Signed)
 Important Message  Patient Details  Name: Dawn Roy MRN: [id number] Date of Birth: 1930/02/20   Important Message Given:  Yes - Medicare IM     Claretta Deed 01/26/2024, 1:49 PM

## 2024-01-26 NOTE — Progress Notes (Signed)
 [id number] [facility] Liaison Note  Dawn Roy is a current hospice patient with a terminal diagnosis of Moderate Protein Calorie Malnutrition. Patient family contacted [facility] and reported that patient had a fall in the bathroom and had been on the floor for about an hour and was complaining of left hip pain. EMS was activated and patient was transported to hospital. She was admitted to [facility] on 11.16.25 for Recurrent cellulitis of bilateral breasts with open wounds requiring IV antibiotics. Per Dr. Deane Finder with [facility], this is a related hospital admission. Patient is a DNR.  Met with patient and family at bedside. Patient is alert and responsive.  She is pleasantly confused.  Daughter reports patient has a respite stay at hospice home on Friday 11.21 and is hoping she is discharged by then.    Pt is inpatient appropriate due to treatment of cellulitis requiring IV antibiotics for management.   VS: 97.7, 122/75, 97, 20, 94% RA I/O: 240/800  Abnormal Labs:   Chloride 95, Calcium 8.6, RBC 3.37, Hgb 9.9, Hct 31.5  Diagnostics: Ultrasound Tuesday, no results yet.  IV/PRN meds: ROCEPHIN  2 g in sodium chloride  0.9 % 100 mL IVPB  x1, Vancomycin  500mg  IV x1, oxycodone  5mg  PO x 1  Assessment and Plan (per Cherlyn Labella, MD on 11.18.25)   Assessment & Plan:  Principal Problem:   Cellulitis of breast Active Problems:   Persistent atrial fibrillation (HCC)   Depression with anxiety   Chronic kidney disease, stage 3a (HCC)   Bacteriuria   Fall at home, initial encounter   Recurrent cellulitis of bilateral breasts with open wounds.  H/o breast cancer s/p bilateral mastectomy followed by gel implant in 1991  Failed outpatient oral antibiotics. She was started on broad spectrum IV antibiotics, IV vancomycin  and IV ceftriaxone , the erythema and tenderness have improved. US  of the breasts ordered for evaluation of abscess, done but report is  pending.  Pain control.  Leukocytosis has resolved. Wound care consulted and recommendations given.     Discharge Planning: ongoing, home when medically stable Family Contact: Spoke with family at bedside   IDT: updated  Goals of Care: Limited DNR  Should patient need ambulance transport at discharge, please call [facility] as we contract with them for our active hospice patients.  Please call with any hospice questions or concerns.  Thank you, Inocente Jacobs, BSN, [facility] [phone]

## 2024-01-26 NOTE — Progress Notes (Signed)
 Mobility Specialist: Progress Note   01/26/24 1600  Mobility  Activity Stood at bedside  Level of Assistance Maximum assist, patient does 25-49%  Assistive Device Front wheel walker  Activity Response Tolerated fair  Mobility Referral Yes  Mobility visit 1 Mobility  Mobility Specialist Start Time (ACUTE ONLY) 1135  Mobility Specialist Stop Time (ACUTE ONLY) 1148  Mobility Specialist Time Calculation (min) (ACUTE ONLY) 13 min    Pt received in bed, agreeable to mobility session. ModA for bed mobility to assist with scooting EOB. Three attempts to stand at EOB with maxA. First attempt was successful and pt was able to complete a few marches before needing to sit. Last two attempts were unsuccessful, pt c/o fatigue and needing to sit before clearing her bottom from the EOB. Returned pt to supine with maxA. Left in bed with all needs met, call bell in reach. Bed alarm on.   Ileana Lute Mobility Specialist Please contact via SecureChat or Rehab office at [phone]

## 2024-01-26 NOTE — Progress Notes (Signed)
 PROGRESS NOTE    Dawn Roy  FMW:[id number] DOB: 11-12-1929 DOA: 01/22/2024 PCP: Elliot Charm, MD   Brief Narrative:    89 y.o. 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  US  breasts done and report is pending. Home with hospice on discharge.  Assessment & Plan:  Principal Problem:   Cellulitis of breast Active Problems:   Persistent atrial fibrillation (HCC)   Depression with anxiety   Chronic kidney disease, stage 3a (HCC)   Bacteriuria   Fall at home, initial encounter    Recurrent cellulitis of bilateral breasts with open wounds.  H/o breast cancer s/p bilateral mastectomy followed by gel implant in 1991  Failed outpatient oral antibiotics. She was started on broad spectrum IV antibiotics, IV vancomycin  and IV ceftriaxone , the erythema and tenderness have improved. US  of the breasts ordered for evaluation of abscess, done but report is pending.  Pain control.  Leukocytosis has resolved. Wound care consulted and recommendations given.        Fall at home/Physical deconditioning,POA:  PT/OT evaluations ordered      Persistent Atrial fibrillation Rate controlled.  continue Eliquis  and metoprolol .      Asymptomatic bacteriuria No urinary symptoms.        Anemia of chronic disease Hemoglobin stable     Depression / anxiety continue home meds.    Stage 3a CKD Creatinine ranging between 1 to 1.4.  Creatinine at 1.2 and stable today.  Repeat labs today.      Of note patient is active with home hospice.    Estimated body mass index is 32.92 kg/m as calculated from the following:   Height as of this encounter: 5' 2 (1.575 m).   Weight as  of this encounter: 81.6 kg.  DVT prophylaxis:  apixaban  (ELIQUIS ) tablet 5 mg     Code Status: Limited: Do not attempt resuscitation (DNR) -DNR-LIMITED -Do Not Intubate/DNI  Family Communication:   Status is: Inpatient Remains inpatient appropriate because: cellulitis    Subjective:    Examination:  General exam: Appears calm and comfortable  Respiratory system: Clear to auscultation. Respiratory effort normal. Cardiovascular system: S1 & S2 heard, RRR. No JVD, murmurs, rubs, gallops or clicks. No pedal edema. Gastrointestinal system: Abdomen is nondistended, soft and nontender. No organomegaly or masses felt. Normal bowel sounds heard. Central nervous system: Alert and oriented. No focal neurological deficits. Extremities: Symmetric 5 x 5 power. Skin: No rashes, lesions or ulcers Psychiatry: Judgement and insight appear normal. Mood & affect appropriate.     Diet Orders (From admission, onward)     Start     Ordered   01/23/24 0058  Diet Heart Room service appropriate? Yes; Fluid consistency: Thin  Diet effective now       Question Answer Comment  Room service appropriate? Yes   Fluid consistency: Thin      01/23/24 0059            Objective: Vitals:   01/26/24 0022 01/26/24 0330 01/26/24 0802 01/26/24 1227  BP: 109/68 (!) 121/50 122/75 135/66  Pulse: 90 88 97 85  Resp: 18 18 20 19   Temp: 98.6 F (37 C) 98.3 F (36.8 C) 97.7 F (36.5 C) 97.7 F (36.5 C)  TempSrc: Oral Oral    SpO2: 95% 95%  94% 97%  Weight:      Height:        Intake/Output Summary (Last 24 hours) at 01/26/2024 1255 Last data filed at 01/26/2024 0329 Gross per 24 hour  Intake --  Output 800 ml  Net -800 ml   Filed Weights   01/22/24 1803  Weight: 81.6 kg    Scheduled Meds:  apixaban   5 mg Oral BID   citalopram   20 mg Oral QPC supper   collagenase    Topical Daily   diclofenac  Sodium  4 g Topical TID AC & HS   metoprolol  succinate  25 mg Oral Daily   Continuous  Infusions:  cefTRIAXone  (ROCEPHIN )  IV 2 g (01/25/24 2104)   vancomycin  500 mg (01/26/24 0524)    Nutritional status     Body mass index is 32.92 kg/m.  Data Reviewed:   CBC: Recent Labs  Lab 01/22/24 2033 01/23/24 0216 01/25/24 1207  WBC 15.0* 12.4* 7.1  NEUTROABS 12.2*  --  4.5  HGB 10.6* 9.7* 9.9*  HCT 34.2* 31.5* 31.5*  MCV 94.5 95.2 93.5  PLT 332 292 300   Basic Metabolic Panel: Recent Labs  Lab 01/22/24 2033 01/23/24 0216 01/25/24 1207  NA 135 134* 135  K 3.9 3.8 4.3  CL 97* 97* 95*  CO2 29 27 29   GLUCOSE 108* 129* 108*  BUN 16 17 14   CREATININE 1.27* 1.26* 0.73  CALCIUM 8.6* 8.1* 8.6*   GFR: Estimated Creatinine Clearance: 44.4 mL/min (by C-G formula based on SCr of 0.73 mg/dL). Liver Function Tests: Recent Labs  Lab 01/22/24 2033  AST 21  ALT 11  ALKPHOS 60  BILITOT 0.8  PROT 6.0*  ALBUMIN 2.6*   No results for input(s): LIPASE, AMYLASE in the last 168 hours. No results for input(s): AMMONIA in the last 168 hours. Coagulation Profile: No results for input(s): INR, PROTIME in the last 168 hours. Cardiac Enzymes: No results for input(s): CKTOTAL, CKMB, CKMBINDEX, TROPONINI in the last 168 hours. BNP (last 3 results) No results for input(s): PROBNP in the last 8760 hours. HbA1C: No results for input(s): HGBA1C in the last 72 hours. CBG: No results for input(s): GLUCAP in the last 168 hours. Lipid Profile: No results for input(s): CHOL, HDL, LDLCALC, TRIG, CHOLHDL, LDLDIRECT in the last 72 hours. Thyroid Function Tests: No results for input(s): TSH, T4TOTAL, FREET4, T3FREE, THYROIDAB in the last 72 hours. Anemia Panel: No results for input(s): VITAMINB12, FOLATE, FERRITIN, TIBC, IRON, RETICCTPCT in the last 72 hours. Sepsis Labs: Recent Labs  Lab 01/22/24 2059  LATICACIDVEN 1.5    Recent Results (from the past 240 hours)  Blood culture (routine x 2)     Status: None  (Preliminary result)   Collection Time: 01/22/24 11:39 PM   Specimen: BLOOD  Result Value Ref Range Status   Specimen Description BLOOD LEFT ANTECUBITAL  Final   Special Requests   Final    BOTTLES DRAWN AEROBIC AND ANAEROBIC Blood Culture adequate volume   Culture   Final    NO GROWTH 3 DAYS Performed at [facility], [address], [place], [place] [address]    Report Status PENDING  Incomplete  Blood culture (routine x 2)     Status: Abnormal   Collection Time: 01/22/24 11:39 PM   Specimen: BLOOD  Result Value Ref Range Status   Specimen Description BLOOD RIGHT ANTECUBITAL  Final   Special Requests   Final    BOTTLES DRAWN AEROBIC AND ANAEROBIC Blood Culture adequate volume  Culture  Setup Time   Final    GRAM POSITIVE COCCI IN CLUSTERS AEROBIC BOTTLE ONLY CRITICAL RESULT CALLED TO, READ BACK BY AND VERIFIED WITH: PHARMD J LEDFORD 01/24/2024 @ 0345 BY AB    Culture (A)  Final    STAPHYLOCOCCUS EPIDERMIDIS THE SIGNIFICANCE OF ISOLATING THIS ORGANISM FROM A SINGLE SET OF BLOOD CULTURES WHEN MULTIPLE SETS ARE DRAWN IS UNCERTAIN. PLEASE NOTIFY THE MICROBIOLOGY DEPARTMENT WITHIN ONE WEEK IF SPECIATION AND SENSITIVITIES ARE REQUIRED. Performed at [facility], [address] [place], [place] [address]    Report Status 01/25/2024 FINAL  Final  Blood Culture ID Panel (Reflexed)     Status: Abnormal   Collection Time: 01/22/24 11:39 PM  Result Value Ref Range Status   Enterococcus faecalis NOT DETECTED NOT DETECTED Final   Enterococcus Faecium NOT DETECTED NOT DETECTED Final   Listeria monocytogenes NOT DETECTED NOT DETECTED Final   Staphylococcus species DETECTED (A) NOT DETECTED Final    Comment: CRITICAL RESULT CALLED TO, READ BACK BY AND VERIFIED WITH: PHARMD J LEDFORD 01/24/2024 @ 0345 BY AB    Staphylococcus aureus (BCID) NOT DETECTED NOT DETECTED Final   Staphylococcus epidermidis DETECTED (A) NOT DETECTED Final    Comment: Methicillin (oxacillin) resistant  coagulase negative staphylococcus. Possible blood culture contaminant (unless isolated from more than one blood culture draw or clinical case suggests pathogenicity). No antibiotic treatment is indicated for blood  culture contaminants. CRITICAL RESULT CALLED TO, READ BACK BY AND VERIFIED WITH: PHARMD J LEDFORD 01/24/2024 @ 0345 BY AB    Staphylococcus lugdunensis NOT DETECTED NOT DETECTED Final   Streptococcus species NOT DETECTED NOT DETECTED Final   Streptococcus agalactiae NOT DETECTED NOT DETECTED Final   Streptococcus pneumoniae NOT DETECTED NOT DETECTED Final   Streptococcus pyogenes NOT DETECTED NOT DETECTED Final   A.calcoaceticus-baumannii NOT DETECTED NOT DETECTED Final   Bacteroides fragilis NOT DETECTED NOT DETECTED Final   Enterobacterales NOT DETECTED NOT DETECTED Final   Enterobacter cloacae complex NOT DETECTED NOT DETECTED Final   Escherichia coli NOT DETECTED NOT DETECTED Final   Klebsiella aerogenes NOT DETECTED NOT DETECTED Final   Klebsiella oxytoca NOT DETECTED NOT DETECTED Final   Klebsiella pneumoniae NOT DETECTED NOT DETECTED Final   Proteus species NOT DETECTED NOT DETECTED Final   Salmonella species NOT DETECTED NOT DETECTED Final   Serratia marcescens NOT DETECTED NOT DETECTED Final   Haemophilus influenzae NOT DETECTED NOT DETECTED Final   Neisseria meningitidis NOT DETECTED NOT DETECTED Final   Pseudomonas aeruginosa NOT DETECTED NOT DETECTED Final   Stenotrophomonas maltophilia NOT DETECTED NOT DETECTED Final   Candida albicans NOT DETECTED NOT DETECTED Final   Candida auris NOT DETECTED NOT DETECTED Final   Candida glabrata NOT DETECTED NOT DETECTED Final   Candida krusei NOT DETECTED NOT DETECTED Final   Candida parapsilosis NOT DETECTED NOT DETECTED Final   Candida tropicalis NOT DETECTED NOT DETECTED Final   Cryptococcus neoformans/gattii NOT DETECTED NOT DETECTED Final   Methicillin resistance mecA/C DETECTED (A) NOT DETECTED Final    Comment:  CRITICAL RESULT CALLED TO, READ BACK BY AND VERIFIED WITH: PHARMD J LEDFORD 01/24/2024 @ 0345 BY AB Performed at [facility], [address] [place], [place] [address]   Culture, blood (Routine X 2) w Reflex to ID Panel     Status: None (Preliminary result)   Collection Time: 01/25/24 12:07 PM   Specimen: BLOOD  Result Value Ref Range Status   Specimen Description BLOOD LEFT ANTECUBITAL  Final  Special Requests   Final    BOTTLES DRAWN AEROBIC AND ANAEROBIC Blood Culture adequate volume   Culture   Final    NO GROWTH < 24 HOURS Performed at [facility], [address], [place], [place] [address]    Report Status PENDING  Incomplete  Culture, blood (Routine X 2) w Reflex to ID Panel     Status: None (Preliminary result)   Collection Time: 01/25/24 12:12 PM   Specimen: BLOOD  Result Value Ref Range Status   Specimen Description BLOOD RIGHT ANTECUBITAL  Final   Special Requests   Final    BOTTLES DRAWN AEROBIC AND ANAEROBIC Blood Culture results may not be optimal due to an inadequate volume of blood received in culture bottles   Culture   Final    NO GROWTH < 24 HOURS Performed at [facility], [address] [place], [place] [address]    Report Status PENDING  Incomplete         Radiology Studies: No results found.         LOS: 2 days   Time spent= 35 mins    Deliliah Room, MD [facility]  If 7PM-7AM, please contact night-coverage  01/26/2024, 12:55 PM

## 2024-01-27 DIAGNOSIS — N61 Mastitis without abscess: Secondary | ICD-10-CM | POA: Diagnosis not present

## 2024-01-27 MED ORDER — LINEZOLID 600 MG PO TABS
600.0000 mg | ORAL_TABLET | Freq: Two times a day (BID) | ORAL | Status: DC
  Administered 2024-01-27 – 2024-01-28 (×2): 600 mg via ORAL
  Filled 2024-01-27 (×2): qty 1

## 2024-01-27 NOTE — Plan of Care (Signed)

## 2024-01-27 NOTE — Progress Notes (Addendum)
 PROGRESS NOTE    Dawn Roy  FMW:[id number] DOB: May 18, 1929 DOA: 01/22/2024 PCP: Elliot Charm, MD   Brief Narrative:    89 y.o. 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  US  breasts done and report is pending. Home with hospice on discharge.  Assessment & Plan:  Principal Problem:   Cellulitis of breast Active Problems:   Persistent atrial fibrillation (HCC)   Depression with anxiety   Chronic kidney disease, stage 3a (HCC)   Bacteriuria   Fall at home, initial encounter    Recurrent cellulitis of bilateral breasts with open wounds.  H/o breast cancer s/p bilateral mastectomy followed by gel implant in 1991  Failed outpatient oral antibiotics. She was started on broad spectrum IV antibiotics, IV vancomycin  and IV ceftriaxone , the erythema and tenderness have improved. Dced both IV Vanc and Ceftriaxone  on 11/20 and started on oral linezolid  600 mg BID. US  of the breasts ordered for evaluation of abscess, done but report is pending.  Pain control.  Leukocytosis has resolved. Wound care consulted and recommendations given.        Fall at home/Physical deconditioning,POA:  PT/OT evaluations ordered      Persistent Atrial fibrillation Rate controlled.  continue Eliquis  and metoprolol .      Asymptomatic bacteriuria No urinary symptoms.      Anemia of chronic disease Hemoglobin stable     Major moderate depression / anxiety continue with citalopram  20 mg daily   Stage 3a CKD Creatinine ranging between 1 to 1.4.  Creatinine at 1.2 and stable today.  Repeat labs today.      Of note patient is active with home hospice.  Spoke with Inocente Jacobs,  [facility] hospital Liaison on 01/26/2024 and patient will be discharged with hospice home on 01/28/2024.   Estimated body mass index is 32.92 kg/m as calculated from the following:   Height as of this encounter: 5' 2 (1.575 m).   Weight as of this encounter: 81.6 kg.  DVT prophylaxis:  apixaban  (ELIQUIS ) tablet 5 mg     Code Status: Limited: Do not attempt resuscitation (DNR) -DNR-LIMITED -Do Not Intubate/DNI  Family Communication: None at the bedside Status is: Inpatient Remains inpatient appropriate because: cellulitis    Subjective:  No acute issues overnight.  Resting comfortably in the bed.  No family member was present at the bedside.  Examination:  General exam: Appears calm and comfortable  Respiratory system: Clear to auscultation. Respiratory effort normal. Cardiovascular system: S1 & S2 heard, RRR. No JVD, murmurs, rubs, gallops or clicks. No pedal edema. Gastrointestinal system: Abdomen is nondistended, soft and nontender. No organomegaly or masses felt. Normal bowel sounds heard. Central nervous system: Alert and oriented x 1. No focal neurological deficits. Extremities: Symmetric 5 x 5 power. Skin: No rashes, lesions or ulcers Psychiatry: Pleasantly confused     Diet Orders (From admission, onward)     Start     Ordered   01/23/24 0058  Diet Heart Room service appropriate? Yes; Fluid consistency: Thin  Diet effective now       Question Answer Comment  Room service appropriate? Yes   Fluid consistency: Thin      01/23/24 0059            Objective: Vitals:   01/26/24 1953 01/27/24  0052 01/27/24 0620 01/27/24 0814  BP: 126/65 (!) 117/59 122/73 (!) 135/55  Pulse: 84 88 82 67  Resp:   20 16  Temp: 98.1 F (36.7 C) 98.3 F (36.8 C) 98 F (36.7 C) 98.1 F (36.7 C)  TempSrc:   Oral   SpO2: 98% 94% 96% 97%  Weight:      Height:        Intake/Output Summary (Last 24 hours) at 01/27/2024 0916 Last data filed at 01/27/2024 0631 Gross per 24  hour  Intake 360 ml  Output 1400 ml  Net -1040 ml   Filed Weights   01/22/24 1803  Weight: 81.6 kg    Scheduled Meds:  apixaban   5 mg Oral BID   citalopram   20 mg Oral QPC supper   collagenase   Topical Daily   diclofenac  Sodium  4 g Topical TID AC & HS   metoprolol  succinate  25 mg Oral Daily   Continuous Infusions:  cefTRIAXone  (ROCEPHIN )  IV 2 g (01/26/24 2012)   vancomycin  500 mg (01/27/24 0631)    Nutritional status     Body mass index is 32.92 kg/m.  Data Reviewed:   CBC: Recent Labs  Lab 01/22/24 2033 01/23/24 0216 01/25/24 1207  WBC 15.0* 12.4* 7.1  NEUTROABS 12.2*  --  4.5  HGB 10.6* 9.7* 9.9*  HCT 34.2* 31.5* 31.5*  MCV 94.5 95.2 93.5  PLT 332 292 300   Basic Metabolic Panel: Recent Labs  Lab 01/22/24 2033 01/23/24 0216 01/25/24 1207  NA 135 134* 135  K 3.9 3.8 4.3  CL 97* 97* 95*  CO2 29 27 29   GLUCOSE 108* 129* 108*  BUN 16 17 14   CREATININE 1.27* 1.26* 0.73  CALCIUM 8.6* 8.1* 8.6*   GFR: Estimated Creatinine Clearance: 44.4 mL/min (by C-G formula based on SCr of 0.73 mg/dL). Liver Function Tests: Recent Labs  Lab 01/22/24 2033  AST 21  ALT 11  ALKPHOS 60  BILITOT 0.8  PROT 6.0*  ALBUMIN 2.6*   No results for input(s): LIPASE, AMYLASE in the last 168 hours. No results for input(s): AMMONIA in the last 168 hours. Coagulation Profile: No results for input(s): INR, PROTIME in the last 168 hours. Cardiac Enzymes: No results for input(s): CKTOTAL, CKMB, CKMBINDEX, TROPONINI in the last 168 hours. BNP (last 3 results) No results for input(s): PROBNP in the last 8760 hours. HbA1C: No results for input(s): HGBA1C in the last 72 hours. CBG: No results for input(s): GLUCAP in the last 168 hours. Lipid Profile: No results for input(s): CHOL, HDL, LDLCALC, TRIG, CHOLHDL, LDLDIRECT in the last 72 hours. Thyroid Function Tests: No results for input(s): TSH, T4TOTAL, FREET4, T3FREE,  THYROIDAB in the last 72 hours. Anemia Panel: No results for input(s): VITAMINB12, FOLATE, FERRITIN, TIBC, IRON, RETICCTPCT in the last 72 hours. Sepsis Labs: Recent Labs  Lab 01/22/24 2059  LATICACIDVEN 1.5    Recent Results (from the past 240 hours)  Blood culture (routine x 2)     Status: None (Preliminary result)   Collection Time: 01/22/24 11:39 PM   Specimen: BLOOD  Result Value Ref Range Status   Specimen Description BLOOD LEFT ANTECUBITAL  Final   Special Requests   Final    BOTTLES DRAWN AEROBIC AND ANAEROBIC Blood Culture adequate volume   Culture   Final    NO GROWTH 4 DAYS Performed at [facility], [address] [place], [place] [address]    Report Status PENDING  Incomplete  Blood culture (  routine x 2)     Status: Abnormal   Collection Time: 01/22/24 11:39 PM   Specimen: BLOOD  Result Value Ref Range Status   Specimen Description BLOOD RIGHT ANTECUBITAL  Final   Special Requests   Final    BOTTLES DRAWN AEROBIC AND ANAEROBIC Blood Culture adequate volume   Culture  Setup Time   Final    GRAM POSITIVE COCCI IN CLUSTERS AEROBIC BOTTLE ONLY CRITICAL RESULT CALLED TO, READ BACK BY AND VERIFIED WITH: PHARMD J LEDFORD 01/24/2024 @ 0345 BY AB    Culture (A)  Final    STAPHYLOCOCCUS EPIDERMIDIS THE SIGNIFICANCE OF ISOLATING THIS ORGANISM FROM A SINGLE SET OF BLOOD CULTURES WHEN MULTIPLE SETS ARE DRAWN IS UNCERTAIN. PLEASE NOTIFY THE MICROBIOLOGY DEPARTMENT WITHIN ONE WEEK IF SPECIATION AND SENSITIVITIES ARE REQUIRED. Performed at [facility], [address], [place], [place] [address]    Report Status 01/25/2024 FINAL  Final  Blood Culture ID Panel (Reflexed)     Status: Abnormal   Collection Time: 01/22/24 11:39 PM  Result Value Ref Range Status   Enterococcus faecalis NOT DETECTED NOT DETECTED Final   Enterococcus Faecium NOT DETECTED NOT DETECTED Final   Listeria monocytogenes NOT DETECTED NOT DETECTED Final   Staphylococcus  species DETECTED (A) NOT DETECTED Final    Comment: CRITICAL RESULT CALLED TO, READ BACK BY AND VERIFIED WITH: PHARMD J LEDFORD 01/24/2024 @ 0345 BY AB    Staphylococcus aureus (BCID) NOT DETECTED NOT DETECTED Final   Staphylococcus epidermidis DETECTED (A) NOT DETECTED Final    Comment: Methicillin (oxacillin) resistant coagulase negative staphylococcus. Possible blood culture contaminant (unless isolated from more than one blood culture draw or clinical case suggests pathogenicity). No antibiotic treatment is indicated for blood  culture contaminants. CRITICAL RESULT CALLED TO, READ BACK BY AND VERIFIED WITH: PHARMD J LEDFORD 01/24/2024 @ 0345 BY AB    Staphylococcus lugdunensis NOT DETECTED NOT DETECTED Final   Streptococcus species NOT DETECTED NOT DETECTED Final   Streptococcus agalactiae NOT DETECTED NOT DETECTED Final   Streptococcus pneumoniae NOT DETECTED NOT DETECTED Final   Streptococcus pyogenes NOT DETECTED NOT DETECTED Final   A.calcoaceticus-baumannii NOT DETECTED NOT DETECTED Final   Bacteroides fragilis NOT DETECTED NOT DETECTED Final   Enterobacterales NOT DETECTED NOT DETECTED Final   Enterobacter cloacae complex NOT DETECTED NOT DETECTED Final   Escherichia coli NOT DETECTED NOT DETECTED Final   Klebsiella aerogenes NOT DETECTED NOT DETECTED Final   Klebsiella oxytoca NOT DETECTED NOT DETECTED Final   Klebsiella pneumoniae NOT DETECTED NOT DETECTED Final   Proteus species NOT DETECTED NOT DETECTED Final   Salmonella species NOT DETECTED NOT DETECTED Final   Serratia marcescens NOT DETECTED NOT DETECTED Final   Haemophilus influenzae NOT DETECTED NOT DETECTED Final   Neisseria meningitidis NOT DETECTED NOT DETECTED Final   Pseudomonas aeruginosa NOT DETECTED NOT DETECTED Final   Stenotrophomonas maltophilia NOT DETECTED NOT DETECTED Final   Candida albicans NOT DETECTED NOT DETECTED Final   Candida auris NOT DETECTED NOT DETECTED Final   Candida glabrata NOT  DETECTED NOT DETECTED Final   Candida krusei NOT DETECTED NOT DETECTED Final   Candida parapsilosis NOT DETECTED NOT DETECTED Final   Candida tropicalis NOT DETECTED NOT DETECTED Final   Cryptococcus neoformans/gattii NOT DETECTED NOT DETECTED Final   Methicillin resistance mecA/C DETECTED (A) NOT DETECTED Final    Comment: CRITICAL RESULT CALLED TO, READ BACK BY AND VERIFIED WITH: PHARMD J LEDFORD 01/24/2024 @ 0345 BY AB Performed at [facility], [address] [place], [place] [address]   Culture, blood (Routine X 2) w Reflex to ID Panel     Status: None (Preliminary result)   Collection Time: 01/25/24 12:07 PM   Specimen: BLOOD  Result Value Ref Range Status   Specimen Description BLOOD LEFT ANTECUBITAL  Final   Special Requests   Final    BOTTLES DRAWN AEROBIC AND ANAEROBIC Blood Culture adequate volume   Culture   Final    NO GROWTH 2 DAYS Performed at [facility], [address], [place], [place] [address]    Report Status PENDING  Incomplete  Culture, blood (Routine X 2) w Reflex to ID Panel     Status: None (Preliminary result)   Collection Time: 01/25/24 12:12 PM   Specimen: BLOOD  Result Value Ref Range Status   Specimen Description BLOOD RIGHT ANTECUBITAL  Final   Special Requests   Final    BOTTLES DRAWN AEROBIC AND ANAEROBIC Blood Culture results may not be optimal due to an inadequate volume of blood received in culture bottles   Culture   Final    NO GROWTH 2 DAYS Performed at [facility], [address], [place], [place] [address]    Report Status PENDING  Incomplete         Radiology Studies: No results found.         LOS: 3 days   Time spent= 35 mins    Deliliah Room, MD [facility]  If 7PM-7AM, please contact night-coverage  01/27/2024, 9:16 AM

## 2024-01-27 NOTE — Progress Notes (Signed)
 Occupational Therapy Treatment Patient Details Name: Dawn Roy MRN: [id number] DOB: [birth year] Today's Date: 01/27/2024   History of present illness Pt is a 89 y.o. female who presented 01/22/24 s/p fall. Pt also found to have cellulitis of bil breasts. PMH: arthritis, basal cell carcinoma, persistent atrial fibrillation on Eliquis , CKD stage IIIa, normocytic anemia, breast cancer s/p bilateral mastectomy followed by gel implant 1991, recurrent skin infection/cellulitis to the breast due to picking behavior, depression, HTN, osteoporosis, vertigo   OT comments  Pt c/o pain to BL knees, and fatigue, limiting activity. Pt requires frequent cueing for motivation and task continuation. Pt able to ambulate to sink ~10 feet with min/mod A x2 for safety, increased time. Pt sat on BSC at sink, declined ADLs due to fatigue. Pt did some BLE exercises while sitting but with poor follow through and constant cueing for participation. Pt assisted back to bed, mod A for bed mobility. Pt would benefit from continued acute OT to maxmize activity tolerance and participation in ADLs, DC plan still appropriate.       If plan is discharge home, recommend the following:  A lot of help with walking and/or transfers;Two people to help with walking and/or transfers;A lot of help with bathing/dressing/bathroom   Equipment Recommendations  None recommended by OT    Recommendations for Other Services      Precautions / Restrictions Precautions Precautions: Fall Recall of Precautions/Restrictions: Impaired Restrictions Weight Bearing Restrictions Per Provider Order: No       Mobility Bed Mobility Overal bed mobility: Needs Assistance Bed Mobility: Supine to Sit, Sit to Supine     Supine to sit: Mod assist, HOB elevated, Used rails, +2 for physical assistance, +2 for safety/equipment Sit to supine: Mod assist, +2 for physical assistance, +2 for safety/equipment   General bed mobility comments: mod A  for getting in/out of bed, assist with BLEs and scooting to EOB and back.    Transfers Overall transfer level: Needs assistance Equipment used: Rolling walker (2 wheels) Transfers: Sit to/from Stand Sit to Stand: Mod assist, Min assist, +2 physical assistance, +2 safety/equipment           General transfer comment: cueing for hand positioning. Pt frequently wants to sit or stop activity, cueing for motivation and task continuation. RW for support     Balance Overall balance assessment: Needs assistance Sitting-balance support: No upper extremity supported, Feet supported Sitting balance-Leahy Scale: Fair     Standing balance support: Bilateral upper extremity supported, During functional activity Standing balance-Leahy Scale: Poor Standing balance comment: reliant on RW                           ADL either performed or assessed with clinical judgement   ADL Overall ADL's : Needs assistance/impaired Eating/Feeding: Set up;Sitting   Grooming: Maximal assistance;Sitting                   Toilet Transfer: Moderate assistance;Minimal assistance;+2 for safety/equipment           Functional mobility during ADLs: Moderate assistance;Minimal assistance;+2 for safety/equipment;Rolling walker (2 wheels) General ADL Comments: Pt very tired today, able to ambulate to sink and sit for several minutes, max A to assist with grooming as Pt was too tired,  c/o knee pain. Pt declined further ADLs    Extremity/Trunk Assessment Upper Extremity Assessment Upper Extremity Assessment: Generalized weakness            Vision  Perception     Praxis     Communication Communication Communication: No apparent difficulties Factors Affecting Communication: Hearing impaired   Cognition Arousal: Alert Behavior During Therapy: Anxious Cognition: Cognition impaired   Orientation impairments: Time, Situation         OT - Cognition Comments: Pt repeating  phrases throughout session, not fully aware of deficits, very polite.                 Following commands: Impaired Following commands impaired: Follows one step commands with increased time, Follows multi-step commands inconsistently      Cueing   Cueing Techniques: Verbal cues, Tactile cues, Visual cues  Exercises      Shoulder Instructions       General Comments      Pertinent Vitals/ Pain       Pain Assessment Pain Assessment: Faces Faces Pain Scale: Hurts even more Pain Location: bil knees Pain Descriptors / Indicators: Grimacing, Discomfort Pain Intervention(s): Limited activity within patient's tolerance  Home Living                                          Prior Functioning/Environment              Frequency  Min 2X/week        Progress Toward Goals  OT Goals(current goals can now be found in the care plan section)  Progress towards OT goals: Progressing toward goals  Acute Rehab OT Goals Patient Stated Goal: to improve strength and rest OT Goal Formulation: With patient Time For Goal Achievement: 02/08/24 Potential to Achieve Goals: Fair ADL Goals Pt Will Perform Eating: with set-up;sitting Pt Will Perform Grooming: with set-up;sitting Pt Will Transfer to Toilet: with min assist;ambulating Additional ADL Goal #1: Pt to complete bed mobility with CGA in prep for EOB/OOB ADLs  Plan      Co-evaluation    PT/OT/SLP Co-Evaluation/Treatment: Yes Reason for Co-Treatment: Necessary to address cognition/behavior during functional activity;For patient/therapist safety;To address functional/ADL transfers PT goals addressed during session: Mobility/safety with mobility;Balance;Proper use of DME OT goals addressed during session: ADL's and self-care;Proper use of Adaptive equipment and DME      AM-PAC OT 6 Clicks Daily Activity     Outcome Measure   Help from another person eating meals?: A Little Help from another person  taking care of personal grooming?: A Little Help from another person toileting, which includes using toliet, bedpan, or urinal?: Total Help from another person bathing (including washing, rinsing, drying)?: A Lot Help from another person to put on and taking off regular upper body clothing?: A Little Help from another person to put on and taking off regular lower body clothing?: Total 6 Click Score: 13    End of Session Equipment Utilized During Treatment: Rolling walker (2 wheels);Gait belt  OT Visit Diagnosis: Unsteadiness on feet (R26.81);Other abnormalities of gait and mobility (R26.89);Muscle weakness (generalized) (M62.81);Other symptoms and signs involving cognitive function   Activity Tolerance Patient limited by fatigue   Patient Left in bed;with call bell/phone within reach;with bed alarm set   Nurse Communication Mobility status        Time: 1435-1500 OT Time Calculation (min): 25 min  Charges: OT General Charges $OT Visit: 1 Visit OT Treatments $Therapeutic Activity: 8-22 mins  [address], OTR/L   Dawn Roy 01/27/2024, 4:01 PM

## 2024-01-27 NOTE — Progress Notes (Signed)
 Physical Therapy Treatment Patient Details Name: Dawn Roy MRN: [id number] DOB: November 25, 1929 Today's Date: 01/27/2024   History of Present Illness Pt is a 89 y.o. female who presented 01/22/24 s/p fall. Pt also found to have cellulitis of bil breasts. PMH: arthritis, basal cell carcinoma, persistent atrial fibrillation on Eliquis , CKD stage IIIa, normocytic anemia, breast cancer s/p bilateral mastectomy followed by gel implant 1991, recurrent skin infection/cellulitis to the breast due to picking behavior, depression, HTN, osteoporosis, vertigo    PT Comments  The pt remains limited by chronic bil knee pain, only ambulating x2 bouts of up to ~10 ft per bout. However, she took longer and better steps during her second bout after completing lower extremity AROM exercises, likely due to warming up her joints. Pt politely deferred trying to ambulate further though due to the pain. Currently recommend assistance with all standing mobility and using her w/c for longer distance mobility bouts. Will continue to follow acutely.    If plan is discharge home, recommend the following: A little help with walking and/or transfers;A lot of help with bathing/dressing/bathroom;Assistance with cooking/housework;Direct supervision/assist for medications management;Direct supervision/assist for financial management;Assist for transportation;Help with stairs or ramp for entrance;Supervision due to cognitive status   Can travel by private vehicle        Equipment Recommendations  None recommended by PT    Recommendations for Other Services       Precautions / Restrictions Precautions Precautions: Fall Recall of Precautions/Restrictions: Impaired Restrictions Weight Bearing Restrictions Per Provider Order: No     Mobility  Bed Mobility Overal bed mobility: Needs Assistance Bed Mobility: Supine to Sit, Sit to Supine     Supine to sit: Mod assist, HOB elevated, Used rails, +2 for physical  assistance, +2 for safety/equipment Sit to supine: Mod assist, +2 for physical assistance, +2 for safety/equipment   General bed mobility comments: Cues provided to bring bil legs off L EOB one at a time, needing assistance to complete. Cued pt to grab L rail with L UE and therapist's hand with R to pull up to sit. ModAx2 needed to ascend trunk and scoot hips to EOB using bed pad. ModAx2 to manage trunk and legs back to supine from sitting.    Transfers Overall transfer level: Needs assistance Equipment used: Rolling walker (2 wheels), 1 person hand held assist Transfers: Sit to/from Stand Sit to Stand: Mod assist, Min assist, +2 physical assistance, +2 safety/equipment           General transfer comment: Cued pt to push up from bed or commode with pt needing min-modAx2 to power up to stand, gain balance, and tactile cues to extend hips. Pt did not reach back prior to sitting, even though cued to do so    Ambulation/Gait Ambulation/Gait assistance: Min assist, +2 safety/equipment Gait Distance (Feet): 10 Feet (x2 bouts of ~10 ft each bout) Assistive device: Rolling walker (2 wheels) Gait Pattern/deviations: Step-through pattern, Decreased step length - right, Decreased step length - left, Decreased stride length, Trunk flexed Gait velocity: reduced Gait velocity interpretation: <1.31 ft/sec, indicative of household ambulator   General Gait Details: Pt takes slow, small steps with a flexed posture. Pt limited in distance by bil leg (chronic knee) pain. No LOB, minA for balance, +2 for safety   Stairs             Wheelchair Mobility     Tilt Bed    Modified Rankin (Stroke Patients Only)       Balance Overall  balance assessment: Needs assistance Sitting-balance support: No upper extremity supported, Feet supported Sitting balance-Leahy Scale: Fair     Standing balance support: Bilateral upper extremity supported, Reliant on assistive device for balance Standing  balance-Leahy Scale: Poor Standing balance comment: reliant on RW                            Communication Communication Communication: No apparent difficulties Factors Affecting Communication: Hearing impaired  Cognition Arousal: Alert Behavior During Therapy: Anxious   PT - Cognitive impairments: Awareness, Attention, Sequencing, Problem solving, Initiation                       PT - Cognition Comments: Pt internally distracted by bil knee pain, needing encouragement to participate and multi-modal step-by-step cues to sequence tasks or perform exercises correctly. Following commands: Impaired Following commands impaired: Follows one step commands with increased time, Follows multi-step commands inconsistently    Cueing Cueing Techniques: Verbal cues, Tactile cues, Visual cues  Exercises General Exercises - Lower Extremity Long Arc Quad: AROM, Strengthening, Both, 15 reps, Seated Hip Flexion/Marching: AROM, Strengthening, Both, 10 reps, Seated    General Comments        Pertinent Vitals/Pain Pain Assessment Pain Assessment: Faces Faces Pain Scale: Hurts even more Pain Location: bil knees Pain Descriptors / Indicators: Grimacing, Discomfort Pain Intervention(s): Limited activity within patient's tolerance, Monitored during session, Repositioned    Home Living                          Prior Function            PT Goals (current goals can now be found in the care plan section) Acute Rehab PT Goals Patient Stated Goal: to improve and go home PT Goal Formulation: With patient Time For Goal Achievement: 02/06/24 Potential to Achieve Goals: Good Progress towards PT goals: Progressing toward goals    Frequency    Min 2X/week      PT Plan      Co-evaluation   Reason for Co-Treatment: Necessary to address cognition/behavior during functional activity;For patient/therapist safety;To address functional/ADL transfers PT goals  addressed during session: Mobility/safety with mobility;Balance;Proper use of DME OT goals addressed during session: ADL's and self-care      AM-PAC PT 6 Clicks Mobility   Outcome Measure  Help needed turning from your back to your side while in a flat bed without using bedrails?: Total Help needed moving from lying on your back to sitting on the side of a flat bed without using bedrails?: Total Help needed moving to and from a bed to a chair (including a wheelchair)?: A Little Help needed standing up from a chair using your arms (e.g., wheelchair or bedside chair)?: Total Help needed to walk in hospital room?: A Little Help needed climbing 3-5 steps with a railing? : Total 6 Click Score: 10    End of Session Equipment Utilized During Treatment: Gait belt Activity Tolerance: Patient tolerated treatment well Patient left: in bed;with call bell/phone within reach;with bed alarm set   PT Visit Diagnosis: Unsteadiness on feet (R26.81);Other abnormalities of gait and mobility (R26.89);Muscle weakness (generalized) (M62.81);History of falling (Z91.81);Difficulty in walking, not elsewhere classified (R26.2)     Time: 1435-1500 PT Time Calculation (min) (ACUTE ONLY): 25 min  Charges:    $Gait Training: 8-22 mins PT General Charges $$ ACUTE PT VISIT: 1 Visit  Theo Ferretti, PT, DPT Acute Rehabilitation Services  Office: [phone]    Theo CHRISTELLA Ferretti 01/27/2024, 3:51 PM

## 2024-01-27 NOTE — TOC Progression Note (Signed)
 Transition of Care [facility]) - Progression Note    Patient Details  Name: [name] MRN: [id number] Date of Birth: Jan 02, 1930  [facility] of [facility]) CM/SW Contact  Rosaline JONELLE Joe, RN Phone Number: 01/27/2024, 12:06 PM  Clinical Narrative:    CM spoke with Eleanor Nail, CM with [facility] and patient has a respite bed ready at [facility] in [place] on Friday, 01/28/2024.  MD is aware.  I will follow up with [facility] and will arrange transportation to the [facility] [place] [facility] on Friday.                     Expected Discharge Plan and Services                                               Social Drivers of Health (SDOH) Interventions SDOH Screenings   Food Insecurity: No Food Insecurity (01/23/2024)  Housing: Low Risk  (01/23/2024)  Transportation Needs: No Transportation Needs (01/23/2024)  Utilities: Not At Risk (01/23/2024)  Social Connections: Socially Isolated (01/23/2024)  Tobacco Use: Low Risk  (01/22/2024)    Readmission Risk Interventions    01/24/2024    4:11 PM 01/24/2024    3:17 PM 08/10/2023   11:31 AM  Readmission Risk Prevention Plan  Transportation Screening Complete Complete Complete  PCP or Specialist Appt within 5-7 Days   Complete  PCP or Specialist Appt within 3-5 Days Complete Complete   Home Care Screening   Complete  Medication Review (RN CM)   Complete  HRI or Home Care Consult Complete Complete   Social Work Consult for Recovery Care Planning/Counseling Complete Complete   Palliative Care Screening Complete Complete   Medication Review Oceanographer) Referral to Pharmacy Referral to Pharmacy

## 2024-01-27 NOTE — Progress Notes (Addendum)
 [id number] [facility] Liaison Note   Dawn Roy is a current hospice patient with a terminal diagnosis of Moderate Protein Calorie Malnutrition. Patient family contacted [facility] and reported that patient had a fall in the bathroom and had been on the floor for about an hour and was complaining of left hip pain. EMS was activated and patient was transported to hospital. She was admitted to [facility] on 11.16.25 for Recurrent cellulitis of bilateral breasts with open wounds requiring IV antibiotics. Per Dr. Deane Finder with [facility], this is a related hospital admission. Patient is a DNR.   Met with patient and daughter at bedside. Patient is alert and responsive.  She is pleasantly confused. Daughter again mentioned respite stay to begin tomorrow if patient is medically stable. She is awaiting ultrasound and lab results as well.     Pt is inpatient appropriate due to treatment of cellulitis requiring IV antibiotics for management.    VS: 97.6/77/16   106/52   95% on RA  I/O: 360/1,400   Abnormal Labs: none new   Diagnostics: Ultrasound Tuesday, no results yet.   IV/PRN meds: ROCEPHIN  2 g in sodium chloride  0.9 % 100 mL IVPB x1, Vancomycin  500mg  IV x2, oxycodone  5mg  PO x 2, acetaminophen  650mg  PO x2   Assessment and Plan (per Dino Antu, MD on 11.20.25)    Assessment & Plan:  Principal Problem:   Cellulitis of breast Active Problems:   Persistent atrial fibrillation (HCC)   Depression with anxiety   Chronic kidney disease, stage 3a (HCC)   Bacteriuria   Fall at home, initial encounter     Recurrent cellulitis of bilateral breasts with open wounds.  H/o breast cancer s/p bilateral mastectomy followed by gel implant in 1991  Failed outpatient oral antibiotics. She was started on broad spectrum IV antibiotics, IV vancomycin  and IV ceftriaxone , the erythema and tenderness have improved. US  of the breasts ordered for evaluation of abscess,  done but report is pending.  Pain control.  Leukocytosis has resolved. Wound care consulted and recommendations given.        Fall at home/Physical deconditioning,POA:  PT/OT evaluations ordered      Persistent Atrial fibrillation Rate controlled.  continue Eliquis  and metoprolol .      Asymptomatic bacteriuria No urinary symptoms.      Anemia of chronic disease Hemoglobin stable     Major moderate depression / anxiety continue with citalopram  20 mg daily   Stage 3a CKD Creatinine ranging between 1 to 1.4.  Creatinine at 1.2 and stable today.  Repeat labs today.   Discharge Planning: likely to Hospice Home in [place] for previously scheduled respite stay on 11.21 if medically stable  Family Contact: Spoke with daughter at bedside    IDT: updated   Goals of Care: Limited DNR   Should patient need ambulance transport at discharge, please call [facility] as we contract with them for our active hospice patients.   Please call with any hospice questions or concerns.   Thank you, Eleanor Nail, [facility] Liaison [phone]

## 2024-01-28 DIAGNOSIS — N61 Mastitis without abscess: Secondary | ICD-10-CM | POA: Diagnosis not present

## 2024-01-28 LAB — BASIC METABOLIC PANEL WITH GFR
Anion gap: 11 (ref 5–15)
BUN: 14 mg/dL (ref 8–23)
CO2: 26 mmol/L (ref 22–32)
Calcium: 8.5 mg/dL — ABNORMAL LOW (ref 8.9–10.3)
Chloride: 98 mmol/L (ref 98–111)
Creatinine, Ser: 0.87 mg/dL (ref 0.44–1.00)
GFR, Estimated: >60 mL/min (ref >60)
Glucose, Bld: 68 mg/dL — ABNORMAL LOW (ref 70–99)
Potassium: 4.7 mmol/L (ref 3.5–5.1)
Sodium: 135 mmol/L (ref 135–145)

## 2024-01-28 LAB — CULTURE, BLOOD (ROUTINE X 2)
Culture: NO GROWTH
Special Requests: ADEQUATE

## 2024-01-28 MED ORDER — LINEZOLID 600 MG PO TABS: 600.0000 mg | ORAL_TABLET | Freq: Two times a day (BID) | ORAL | 0 refills | Status: AC

## 2024-01-28 MED ORDER — METOPROLOL SUCCINATE ER 25 MG PO TB24: 25.0000 mg | ORAL_TABLET | Freq: Every day | ORAL | Status: AC

## 2024-01-28 MED ORDER — CITALOPRAM HYDROBROMIDE 40 MG PO TABS: 20.0000 mg | ORAL_TABLET | Freq: Every day | ORAL | Status: AC

## 2024-01-28 NOTE — TOC Transition Note (Signed)
 Transition of Care [facility]) - Discharge Note   Patient Details  Name: Dawn Roy MRN: [id number] Date of Birth: February 07, 1930  Transition of Care [facility]) CM/SW Contact:  Rosaline JONELLE Joe, RN Phone Number: 01/28/2024, 2:44 PM   Clinical Narrative:    CM spoke with MD and patient is stable to discharge to [facility] in [place].  Eleanor Nail, CM with [facility] is aware and patient has awaiting bed available at the [facility] in [place].  DNr was signed by MD.  Medical necessity and facesheet was provided.  Discharge summary was included in the transfer packet.  [facility] was called and patient's transport is scheduled for 3:30 pm today.  I called the  daughter, [name] and she is aware of the transport time.  Bedside nursing will call nursing report to [facility] in [place], [place].         Patient Goals and CMS Choice            Discharge Placement                       Discharge Plan and Services Additional resources added to the After Visit Summary for                                       Social Drivers of Health (SDOH) Interventions SDOH Screenings   Food Insecurity: No Food Insecurity (01/23/2024)  Housing: Low Risk  (01/23/2024)  Transportation Needs: No Transportation Needs (01/23/2024)  Utilities: Not At Risk (01/23/2024)  Social Connections: Socially Isolated (01/23/2024)  Tobacco Use: Low Risk  (01/22/2024)     Readmission Risk Interventions    01/24/2024    4:11 PM 01/24/2024    3:17 PM 08/10/2023   11:31 AM  Readmission Risk Prevention Plan  Transportation Screening Complete Complete Complete  PCP or Specialist Appt within 5-7 Days   Complete  PCP or Specialist Appt within 3-5 Days Complete Complete   Home Care Screening   Complete  Medication Review (RN CM)   Complete  HRI or Home Care Consult Complete Complete   Social Work Consult for Recovery Care Planning/Counseling Complete Complete   Palliative Care  Screening Complete Complete   Medication Review Oceanographer) Referral to Pharmacy Referral to Pharmacy

## 2024-01-28 NOTE — Progress Notes (Signed)
 Physical Therapy Treatment Patient Details Name: Dawn Roy MRN: [id number] DOB: [birth year] Today's Date: 01/28/2024   History of Present Illness Pt is a 89 y.o. female who presented 01/22/24 s/p fall. Pt also found to have cellulitis of bil breasts. PMH: arthritis, basal cell carcinoma, persistent atrial fibrillation on Eliquis , CKD stage IIIa, normocytic anemia, breast cancer s/p bilateral mastectomy followed by gel implant 1991, recurrent skin infection/cellulitis to the breast due to picking behavior, depression, HTN, osteoporosis, vertigo    PT Comments  Pt is presenting at Mod A +2 for sit to stand, Min A +2 for gait with chair follow for safety. Pt with chronic knee pain. Pt frequently stating she wants a rest break but when given the opportunity continues with mobility. Daughter present and supportive. Due to pt current functional status, home set up and available assistance at home recommending skilled physical therapy services 3x/week in order to address strength, balance and functional mobility to decrease risk for falls, injury and re-hospitalization.       If plan is discharge home, recommend the following: A little help with walking and/or transfers;A lot of help with bathing/dressing/bathroom;Assistance with cooking/housework;Assist for transportation;Help with stairs or ramp for entrance;Supervision due to cognitive status     Equipment Recommendations  None recommended by PT       Precautions / Restrictions Precautions Precautions: Fall Recall of Precautions/Restrictions: Impaired Restrictions Weight Bearing Restrictions Per Provider Order: No     Mobility  Bed Mobility     General bed mobility comments: Pt up in recliner on arrival and departure.    Transfers Overall transfer level: Needs assistance Equipment used: Rolling walker (2 wheels) Transfers: Sit to/from Stand Sit to Stand: Mod assist, Min assist, +2 physical assistance, +2 safety/equipment            General transfer comment: cueing for hand positioning. Pt frequently wants to sit or stop activity due to high fear of falling, cueing for motivation and task continuation. RW for support    Ambulation/Gait Ambulation/Gait assistance: Min assist, +2 safety/equipment Gait Distance (Feet): 40 Feet Assistive device: Rolling walker (2 wheels) Gait Pattern/deviations: Step-through pattern, Decreased step length - right, Decreased step length - left, Decreased stride length, Trunk flexed Gait velocity: reduced Gait velocity interpretation: <1.31 ft/sec, indicative of household ambulator   General Gait Details: Pt takes slow, small steps with a flexed posture. Pt limited in distance by bil leg (chronic knee) pain. No LOB, minA for balance, +2 for safety and assist navigating AD     Balance Overall balance assessment: Needs assistance Sitting-balance support: No upper extremity supported, Feet supported Sitting balance-Leahy Scale: Fair     Standing balance support: Bilateral upper extremity supported, During functional activity Standing balance-Leahy Scale: Poor Standing balance comment: reliant on RW and external support      Communication Communication Communication: No apparent difficulties Factors Affecting Communication: Hearing impaired  Cognition Arousal: Alert Behavior During Therapy: Anxious   PT - Cognitive impairments: Awareness, Attention, Sequencing, Problem solving, Initiation     PT - Cognition Comments: Pt internally distracted by bil knee pain, needing encouragement to participate and multi-modal step-by-step cues to sequence tasks or perform exercises correctly. Following commands: Impaired Following commands impaired: Follows one step commands with increased time, Follows multi-step commands inconsistently    Cueing Cueing Techniques: Verbal cues, Tactile cues, Visual cues     General Comments General comments (skin integrity, edema, etc.): Daughter  present, supportive. vital signs stable on room air  Pertinent Vitals/Pain Pain Assessment Pain Assessment: Faces Faces Pain Scale: Hurts even more Pain Location: bil knees Pain Descriptors / Indicators: Grimacing, Discomfort Pain Intervention(s): Limited activity within patient's tolerance     PT Goals (current goals can now be found in the care plan section) Acute Rehab PT Goals Patient Stated Goal: to improve and go home PT Goal Formulation: With patient Time For Goal Achievement: 02/06/24 Potential to Achieve Goals: Good Progress towards PT goals: Progressing toward goals    Frequency    Min 2X/week      PT Plan  Continue with current POC        AM-PAC PT 6 Clicks Mobility   Outcome Measure  Help needed turning from your back to your side while in a flat bed without using bedrails?: Total Help needed moving from lying on your back to sitting on the side of a flat bed without using bedrails?: Total Help needed moving to and from a bed to a chair (including a wheelchair)?: A Lot Help needed standing up from a chair using your arms (e.g., wheelchair or bedside chair)?: Total Help needed to walk in hospital room?: A Lot Help needed climbing 3-5 steps with a railing? : Total 6 Click Score: 8    End of Session Equipment Utilized During Treatment: Gait belt Activity Tolerance: Patient tolerated treatment well Patient left: with call bell/phone within reach;in chair;with chair alarm set;with family/visitor present Nurse Communication: Mobility status PT Visit Diagnosis: Unsteadiness on feet (R26.81);Other abnormalities of gait and mobility (R26.89);Muscle weakness (generalized) (M62.81);History of falling (Z91.81);Difficulty in walking, not elsewhere classified (R26.2)     Time: 8899-8876 PT Time Calculation (min) (ACUTE ONLY): 23 min  Charges:    $Therapeutic Activity: 23-37 mins PT General Charges $$ ACUTE PT VISIT: 1 Visit           Dorothyann Maier,  DPT, CLT  Acute Rehabilitation Services Office: [phone] (Secure chat preferred)    Dorothyann VEAR Maier 01/28/2024, 1:15 PM

## 2024-01-28 NOTE — Discharge Summary (Signed)
 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  H/o breast cancer s/p bilateral mastectomy followed by gel implant in 1991  Failed outpatient oral antibiotics. She was started on broad spectrum IV antibiotics, IV vancomycin  and IV ceftriaxone , the erythema and tenderness have improved. Dced both IV Vanc and Ceftriaxone  on 11/20 and started on oral linezolid  600 mg BID. Dced on 3 days of it. US  of the breasts showed b/l breast implants rupture as well as 4.7 x 3.9 x 3.0 cm RIGHT breast fluid collection may be a portion of the intracapsular rupture or a developing abscess.  Discussed with [name] PA general surgery who discussed with her attending Dr Dawn Roy. No acute surgical interventions needed. I spoke to patient's daughter, Dawn Roy and she said that she doesn't want any surgeries as her mother is already on hospice. Leukocytosis has resolved. Wound care consulted and recommendations given.        Fall at home/Physical deconditioning,POA:  PT/OT evaluations      Persistent Atrial fibrillation Rate controlled.  continue Eliquis  and metoprolol .      Asymptomatic bacteriuria No urinary symptoms.      Anemia of chronic disease Hemoglobin stable     Major moderate depression / anxiety continue with citalopram  20 mg daily   Stage 3A CKD  Disposition: Hospice home in [place]       Consultants: None Procedures performed: None  Disposition: Hospice care Diet recommendation:  Cardiac diet DISCHARGE MEDICATION: Allergies as of 01/28/2024       Reactions   Azithromycin Hives   Dalmane [flurazepam] Other (See Comments)   unknown   Demerol [meperidine] Other (See Comments)   Unknown   Doxycycline  Rash   headache   Felodipine Other (See Comments)   unknown   Fosamax [alendronate Sodium] Other (See Comments)   unknown   Hctz [hydrochlorothiazide] Other (See Comments)   Unknown   Ivp Dye [iodinated Contrast Media] Other (See Comments)   unknown   Keflex [cephalexin] Rash   Keflin [cephalothin] Other (See Comments)   unknown   Macrodantin [nitrofurantoin Macrocrystal] Other (See Comments)   unknown   Morphine And Codeine Other (See Comments)   unknown   Neosporin [neomycin-bacitracin Zn-polymyx] Rash   Penicillins Rash   Phenergan [promethazine Hcl] Other (See Comments)   unknown   Valium [diazepam] Other (See Comments)   unknown        Medication List     STOP taking these medications    sulfamethoxazole -trimethoprim  800-160 MG tablet Commonly known as: BACTRIM  DS       TAKE these medications    acetaminophen  500 MG tablet Commonly known as: TYLENOL  Take 1,000 mg by mouth every 6 (six) hours as needed for mild pain (pain score 1-3).   apixaban  5 MG Tabs tablet Commonly known as: ELIQUIS  Take 1 tablet (5 mg total) by mouth 2 (two) times daily.   Cholecalciferol 50 MCG (2000  UT) Caps Take 2,000 Units by mouth daily after supper.   citalopram  40 MG tablet Commonly known as: CELEXA  Take 0.5 tablets (20 mg total) by mouth daily after supper. What changed: when to take this   diclofenac  Sodium 1 % Gel Commonly known as: VOLTAREN  Apply 4 g topically 4 (four) times daily. Apply to right knee What changed:  when to take this reasons to take this   gabapentin  100 MG capsule Commonly known as: NEURONTIN  Take 100 mg by mouth in  the morning.   linezolid  600 MG tablet Commonly known as: ZYVOX  Take 1 tablet (600 mg total) by mouth every 12 (twelve) hours.   loratadine  10 MG tablet Commonly known as: CLARITIN  Take 10 mg by mouth daily after supper.   metoprolol  succinate 25 MG 24 hr tablet Commonly known as: TOPROL -XL Take 1 tablet (25 mg total) by mouth daily. Start taking on: January 29, 2024 What changed:  how much to take Another medication with the same name was removed. Continue taking this medication, and follow the directions you see here.   oxyCODONE  5 MG immediate release tablet Commonly known as: Oxy IR/ROXICODONE  Take 5 mg by mouth every 4 (four) hours as needed.   PROBIOTIC DAILY PO Take 1 capsule by mouth in the morning. Culturelle   prochlorperazine  10 MG tablet Commonly known as: COMPAZINE  Take 10 mg by mouth every 6 (six) hours.   silver  sulfADIAZINE  1 % cream Commonly known as: SILVADENE  Apply topically daily. Apply to breast wound        Follow-up Information     Dawn Charm, MD. Schedule an appointment as soon as possible for a visit in 1 week(s).   Specialty: Internal Medicine Contact information: [address] [place] [address] [phone]                Discharge Exam: Dawn Roy   01/22/24 1803  Weight: 81.6 kg   Constitutional: NAD, calm, comfortable Eyes: PERRL, lids and conjunctivae normal ENMT: Mucous membranes are moist. Posterior pharynx clear of any exudate or lesions.Normal dentition.  Neck: normal, supple, no masses, no thyromegaly Respiratory: clear to auscultation bilaterally, no wheezing, no crackles. Normal respiratory effort. No accessory muscle use.  Cardiovascular: Regular rate and rhythm, no murmurs / rubs / gallops. No extremity edema. 2+ pedal pulses. No carotid bruits.  Abdomen: no tenderness, no masses palpated. No hepatosplenomegaly. Bowel sounds positive.  Musculoskeletal: no clubbing / cyanosis.  No joint deformity upper and lower extremities. Good ROM, no contractures. Normal muscle tone.  Skin: 3 chronic wounds over right breast and one on the left side   Condition at discharge: fair  The results of significant diagnostics from this hospitalization (including imaging, microbiology, ancillary and laboratory) are listed below for reference.   Imaging Studies: US  LIMITED ULTRASOUND INCLUDING AXILLA LEFT BREAST  Result Date: 01/28/2024 CLINICAL DATA:  89 year old woman with history of BILATERAL mastectomy and ruptured BILATERAL breast implants presents with RIGHT breast cellulitis. EXAM: ULTRASOUND OF THE BILATERAL BREAST COMPARISON:  MRI chest 08/10/2023 FINDINGS: RIGHT breast: 4.7 x 3.9 x 3.0 cm collection seen within the RIGHT breast. This may be a portion of the intracapsular rupture seen on prior MRI. Abscesses is difficult to completely exclude. LEFT breast: LEFT breast implant with evidence of intracapsular rupture is again seen. No focal extracapsular fluid collection identified to indicate abscess. IMPRESSION: 1. Redemonstration of BILATERAL breast implant rupture. 2. 4.7 x 3.9 x 3.0 cm RIGHT breast fluid collection may be a  portion of the intracapsular rupture or a developing abscess. This could be best evaluated with contrast-enhanced dedicated breast MRI. 3. Please note sonography is of limited utility in this setting, given that it is difficult to capture anatomy of the entire breast/implant with ultrasound. Electronically Signed   By: Aliene Lloyd M.D.   On: 01/28/2024 13:11   US  LIMITED ULTRASOUND INCLUDING AXILLA RIGHT BREAST Result Date: 01/28/2024 CLINICAL DATA:  89 year old woman with history of BILATERAL mastectomy and ruptured BILATERAL breast implants presents with RIGHT breast cellulitis. EXAM: ULTRASOUND OF THE BILATERAL BREAST COMPARISON:  MRI chest 08/10/2023 FINDINGS: RIGHT breast: 4.7 x 3.9 x 3.0 cm collection seen within the RIGHT breast. This may be a portion of the  intracapsular rupture seen on prior MRI. Abscesses is difficult to completely exclude. LEFT breast: LEFT breast implant with evidence of intracapsular rupture is again seen. No focal extracapsular fluid collection identified to indicate abscess. IMPRESSION: 1. Redemonstration of BILATERAL breast implant rupture. 2. 4.7 x 3.9 x 3.0 cm RIGHT breast fluid collection may be a portion of the intracapsular rupture or a developing abscess. This could be best evaluated with contrast-enhanced dedicated breast MRI. 3. Please note sonography is of limited utility in this setting, given that it is difficult to capture anatomy of the entire breast/implant with ultrasound. Electronically Signed   By: Aliene Lloyd M.D.   On: 01/28/2024 13:11   DG Knee Complete 4 Views Left Result Date: 01/22/2024 CLINICAL DATA:  [name], pain EXAM: LEFT KNEE - COMPLETE 4+ VIEW COMPARISON:  None Available. FINDINGS: Frontal, bilateral oblique, lateral views of the left knee are obtained. No acute fracture, subluxation, or dislocation. There is severe 3 compartmental osteoarthritis. No joint effusion. Soft tissues are unremarkable. IMPRESSION: 1. Severe 3 compartmental osteoarthritis. 2. No acute fracture. Electronically Signed   By: Ozell Daring M.D.   On: 01/22/2024 21:00   DG Hip Unilat With Pelvis 2-3 Views Left Result Date: 01/22/2024 CLINICAL DATA:  [name], left hip pain EXAM: DG HIP (WITH OR WITHOUT PELVIS) 2-3V LEFT COMPARISON:  None Available. FINDINGS: Frontal view of the pelvis as well as frontal and frogleg lateral views of the left hip are obtained. No acute fracture, subluxation, or dislocation. Mild symmetrical bilateral hip osteoarthritis. Moderate lower lumbar spondylosis and facet hypertrophy. The sacroiliac joints are unremarkable. IMPRESSION: 1. No acute displaced fracture. 2. Degenerative changes of the lumbar spine and bilateral hips. Electronically Signed   By: Ozell Daring M.D.   On: 01/22/2024 18:48     Microbiology: Results for orders placed or performed during the hospital encounter of 01/22/24  Blood culture (routine x 2)     Status: None   Collection Time: 01/22/24 11:39 PM   Specimen: BLOOD  Result Value Ref Range Status   Specimen Description BLOOD LEFT ANTECUBITAL  Final   Special Requests   Final    BOTTLES DRAWN AEROBIC AND ANAEROBIC Blood Culture adequate volume   Culture   Final    NO GROWTH 5 DAYS Performed at [facility], [address], [place], [place] [address]    Report Status 01/28/2024 FINAL  Final  Blood culture (routine x 2)     Status: Abnormal   Collection Time: 01/22/24 11:39 PM   Specimen: BLOOD  Result Value Ref Range Status   Specimen Description BLOOD RIGHT ANTECUBITAL  Final   Special Requests   Final    BOTTLES DRAWN AEROBIC AND ANAEROBIC Blood Culture adequate volume   Culture  Setup Time  Final    GRAM POSITIVE COCCI IN CLUSTERS AEROBIC BOTTLE ONLY CRITICAL RESULT CALLED TO, READ BACK BY AND VERIFIED WITH: PHARMD J LEDFORD 01/24/2024 @ 0345 BY AB    Culture (A)  Final    STAPHYLOCOCCUS EPIDERMIDIS THE SIGNIFICANCE OF ISOLATING THIS ORGANISM FROM A SINGLE SET OF BLOOD CULTURES WHEN MULTIPLE SETS ARE DRAWN IS UNCERTAIN. PLEASE NOTIFY THE MICROBIOLOGY DEPARTMENT WITHIN ONE WEEK IF SPECIATION AND SENSITIVITIES ARE REQUIRED. Performed at [facility], [address], [place], [place] [address]    Report Status 01/25/2024 FINAL  Final  Blood Culture ID Panel (Reflexed)     Status: Abnormal   Collection Time: 01/22/24 11:39 PM  Result Value Ref Range Status   Enterococcus faecalis NOT DETECTED NOT DETECTED Final   Enterococcus Faecium NOT DETECTED NOT DETECTED Final   Listeria monocytogenes NOT DETECTED NOT DETECTED Final   Staphylococcus species DETECTED (A) NOT DETECTED Final    Comment: CRITICAL RESULT CALLED TO, READ BACK BY AND VERIFIED WITH: PHARMD J LEDFORD 01/24/2024 @ 0345 BY AB    Staphylococcus aureus (BCID) NOT  DETECTED NOT DETECTED Final   Staphylococcus epidermidis DETECTED (A) NOT DETECTED Final    Comment: Methicillin (oxacillin) resistant coagulase negative staphylococcus. Possible blood culture contaminant (unless isolated from more than one blood culture draw or clinical case suggests pathogenicity). No antibiotic treatment is indicated for blood  culture contaminants. CRITICAL RESULT CALLED TO, READ BACK BY AND VERIFIED WITH: PHARMD J LEDFORD 01/24/2024 @ 0345 BY AB    Staphylococcus lugdunensis NOT DETECTED NOT DETECTED Final   Streptococcus species NOT DETECTED NOT DETECTED Final   Streptococcus agalactiae NOT DETECTED NOT DETECTED Final   Streptococcus pneumoniae NOT DETECTED NOT DETECTED Final   Streptococcus pyogenes NOT DETECTED NOT DETECTED Final   A.calcoaceticus-baumannii NOT DETECTED NOT DETECTED Final   Bacteroides fragilis NOT DETECTED NOT DETECTED Final   Enterobacterales NOT DETECTED NOT DETECTED Final   Enterobacter cloacae complex NOT DETECTED NOT DETECTED Final   Escherichia coli NOT DETECTED NOT DETECTED Final   Klebsiella aerogenes NOT DETECTED NOT DETECTED Final   Klebsiella oxytoca NOT DETECTED NOT DETECTED Final   Klebsiella pneumoniae NOT DETECTED NOT DETECTED Final   Proteus species NOT DETECTED NOT DETECTED Final   Salmonella species NOT DETECTED NOT DETECTED Final   Serratia marcescens NOT DETECTED NOT DETECTED Final   Haemophilus influenzae NOT DETECTED NOT DETECTED Final   Neisseria meningitidis NOT DETECTED NOT DETECTED Final   Pseudomonas aeruginosa NOT DETECTED NOT DETECTED Final   Stenotrophomonas maltophilia NOT DETECTED NOT DETECTED Final   Candida albicans NOT DETECTED NOT DETECTED Final   Candida auris NOT DETECTED NOT DETECTED Final   Candida glabrata NOT DETECTED NOT DETECTED Final   Candida krusei NOT DETECTED NOT DETECTED Final   Candida parapsilosis NOT DETECTED NOT DETECTED Final   Candida tropicalis NOT DETECTED NOT DETECTED Final    Cryptococcus neoformans/gattii NOT DETECTED NOT DETECTED Final   Methicillin resistance mecA/C DETECTED (A) NOT DETECTED Final    Comment: CRITICAL RESULT CALLED TO, READ BACK BY AND VERIFIED WITH: PHARMD J LEDFORD 01/24/2024 @ 0345 BY AB Performed at [facility], [address] [place], [place] [address]   Culture, blood (Routine X 2) w Reflex to ID Panel     Status: None (Preliminary result)   Collection Time: 01/25/24 12:07 PM   Specimen: BLOOD  Result Value Ref Range Status   Specimen Description BLOOD LEFT ANTECUBITAL  Final   Special Requests   Final  BOTTLES DRAWN AEROBIC AND ANAEROBIC Blood Culture adequate volume   Culture   Final    NO GROWTH 3 DAYS Performed at [facility], [address], [place], [place] [address]    Report Status PENDING  Incomplete  Culture, blood (Routine X 2) w Reflex to ID Panel     Status: None (Preliminary result)   Collection Time: 01/25/24 12:12 PM   Specimen: BLOOD  Result Value Ref Range Status   Specimen Description BLOOD RIGHT ANTECUBITAL  Final   Special Requests   Final    BOTTLES DRAWN AEROBIC AND ANAEROBIC Blood Culture results may not be optimal due to an inadequate volume of blood received in culture bottles   Culture   Final    NO GROWTH 3 DAYS Performed at [facility], [address], [place], [place] [address]    Report Status PENDING  Incomplete    Labs: CBC: Recent Labs  Lab 01/22/24 2033 01/23/24 0216 01/25/24 1207  WBC 15.0* 12.4* 7.1  NEUTROABS 12.2*  --  4.5  HGB 10.6* 9.7* 9.9*  HCT 34.2* 31.5* 31.5*  MCV 94.5 95.2 93.5  PLT 332 292 300   Basic Metabolic Panel: Recent Labs  Lab 01/22/24 2033 01/23/24 0216 01/25/24 1207 01/28/24 0533  NA 135 134* 135 135  K 3.9 3.8 4.3 4.7  CL 97* 97* 95* 98  CO2 29 27 29 26   GLUCOSE 108* 129* 108* 68*  BUN 16 17 14 14   CREATININE 1.27* 1.26* 0.73 0.87  CALCIUM 8.6* 8.1* 8.6* 8.5*   Liver Function Tests: Recent Labs  Lab 01/22/24 2033   AST 21  ALT 11  ALKPHOS 60  BILITOT 0.8  PROT 6.0*  ALBUMIN 2.6*   CBG: No results for input(s): GLUCAP in the last 168 hours.  Discharge time spent: 42 minutes.  Signed: Deliliah Room, MD [facility] 01/28/2024

## 2024-01-31 LAB — CULTURE, BLOOD (ROUTINE X 2)
Culture: NO GROWTH
Culture: NO GROWTH
Special Requests: ADEQUATE

## 2024-03-17 ENCOUNTER — Encounter: Payer: Self-pay | Admitting: *Deleted
